# Patient Record
Sex: Female | Born: 1935 | Race: White | Hispanic: No | Smoking: Current every day smoker
Health system: Southern US, Community
[De-identification: ages and names within clinical notes are randomized; demographics above are authoritative.]

## PROBLEM LIST (undated history)

## (undated) DIAGNOSIS — N390 Urinary tract infection, site not specified: Secondary | ICD-10-CM

## (undated) DIAGNOSIS — R197 Diarrhea, unspecified: Secondary | ICD-10-CM

## (undated) DIAGNOSIS — L03119 Cellulitis of unspecified part of limb: Secondary | ICD-10-CM

## (undated) DIAGNOSIS — I519 Heart disease, unspecified: Secondary | ICD-10-CM

## (undated) DIAGNOSIS — K810 Acute cholecystitis: Secondary | ICD-10-CM

## (undated) DIAGNOSIS — G8929 Other chronic pain: Secondary | ICD-10-CM

## (undated) DIAGNOSIS — F329 Major depressive disorder, single episode, unspecified: Secondary | ICD-10-CM

## (undated) DIAGNOSIS — M86169 Other acute osteomyelitis, unspecified tibia and fibula: Secondary | ICD-10-CM

## (undated) DIAGNOSIS — N289 Disorder of kidney and ureter, unspecified: Secondary | ICD-10-CM

## (undated) DIAGNOSIS — F0390 Unspecified dementia without behavioral disturbance: Secondary | ICD-10-CM

## (undated) DIAGNOSIS — D473 Essential (hemorrhagic) thrombocythemia: Secondary | ICD-10-CM

## (undated) DIAGNOSIS — M1990 Unspecified osteoarthritis, unspecified site: Secondary | ICD-10-CM

## (undated) DIAGNOSIS — I259 Chronic ischemic heart disease, unspecified: Secondary | ICD-10-CM

## (undated) DIAGNOSIS — I809 Phlebitis and thrombophlebitis of unspecified site: Secondary | ICD-10-CM

## (undated) DIAGNOSIS — I82409 Acute embolism and thrombosis of unspecified deep veins of unspecified lower extremity: Secondary | ICD-10-CM

## (undated) DIAGNOSIS — E785 Hyperlipidemia, unspecified: Secondary | ICD-10-CM

## (undated) DIAGNOSIS — K838 Other specified diseases of biliary tract: Secondary | ICD-10-CM

## (undated) DIAGNOSIS — F32A Depression, unspecified: Secondary | ICD-10-CM

## (undated) DIAGNOSIS — F1020 Alcohol dependence, uncomplicated: Secondary | ICD-10-CM

## (undated) DIAGNOSIS — I4891 Unspecified atrial fibrillation: Secondary | ICD-10-CM

## (undated) DIAGNOSIS — I2510 Atherosclerotic heart disease of native coronary artery without angina pectoris: Secondary | ICD-10-CM

## (undated) DIAGNOSIS — M549 Dorsalgia, unspecified: Secondary | ICD-10-CM

## (undated) DIAGNOSIS — I10 Essential (primary) hypertension: Secondary | ICD-10-CM

## (undated) DIAGNOSIS — I999 Unspecified disorder of circulatory system: Secondary | ICD-10-CM

## (undated) DIAGNOSIS — K589 Irritable bowel syndrome without diarrhea: Secondary | ICD-10-CM

## (undated) HISTORY — DX: Essential (primary) hypertension: I10

## (undated) HISTORY — PX: THROMBECTOMY: PRO61

## (undated) HISTORY — DX: Alcohol dependence, uncomplicated: F10.20

## (undated) HISTORY — DX: Phlebitis and thrombophlebitis of unspecified site: I80.9

## (undated) HISTORY — DX: Hyperlipidemia, unspecified: E78.5

## (undated) HISTORY — DX: Dorsalgia, unspecified: M54.9

## (undated) HISTORY — DX: Heart disease, unspecified: I51.9

## (undated) HISTORY — DX: Cellulitis of unspecified part of limb: L03.119

## (undated) HISTORY — DX: Urinary tract infection, site not specified: N39.0

## (undated) HISTORY — PX: SHOULDER SURGERY: SHX246

## (undated) HISTORY — DX: Unspecified osteoarthritis, unspecified site: M19.90

## (undated) HISTORY — DX: Acute cholecystitis: K81.0

## (undated) HISTORY — PX: CORONARY ANGIOPLASTY WITH STENT PLACEMENT: SHX49

## (undated) HISTORY — PX: HAND SURGERY: SHX662

## (undated) HISTORY — DX: Depression, unspecified: F32.A

## (undated) HISTORY — DX: Major depressive disorder, single episode, unspecified: F32.9

## (undated) HISTORY — PX: SPINE SURGERY: SHX786

## (undated) HISTORY — PX: EXPLORATORY LAPAROTOMY: SUR591

## (undated) HISTORY — DX: Other chronic pain: G89.29

## (undated) HISTORY — DX: Other acute osteomyelitis, unspecified tibia and fibula: M86.169

## (undated) HISTORY — PX: APPENDECTOMY: SHX54

---

## 2012-02-14 ENCOUNTER — Encounter (HOSPITAL_COMMUNITY): Payer: Self-pay

## 2012-02-14 ENCOUNTER — Emergency Department (HOSPITAL_COMMUNITY)
Admission: EM | Admit: 2012-02-14 | Discharge: 2012-02-14 | Disposition: A | Discharge status: Skilled Nursing Facility | Payer: Medicare Other | Attending: Emergency Medicine | Admitting: Emergency Medicine

## 2012-02-14 DIAGNOSIS — N390 Urinary tract infection, site not specified: Secondary | ICD-10-CM

## 2012-02-14 DIAGNOSIS — Z86718 Personal history of other venous thrombosis and embolism: Secondary | ICD-10-CM | POA: Insufficient documentation

## 2012-02-14 DIAGNOSIS — R6889 Other general symptoms and signs: Secondary | ICD-10-CM | POA: Insufficient documentation

## 2012-02-14 DIAGNOSIS — F17200 Nicotine dependence, unspecified, uncomplicated: Secondary | ICD-10-CM | POA: Insufficient documentation

## 2012-02-14 HISTORY — DX: Unspecified disorder of circulatory system: I99.9

## 2012-02-14 HISTORY — DX: Acute embolism and thrombosis of unspecified deep veins of unspecified lower extremity: I82.409

## 2012-02-14 HISTORY — DX: Disorder of kidney and ureter, unspecified: N28.9

## 2012-02-14 LAB — CBC WITH DIFFERENTIAL/PLATELET
Basophils Absolute: 0.1 10*3/uL (ref 0.0–0.1)
Basophils Relative: 1 % (ref 0–1)
Eosinophils Absolute: 0.1 10*3/uL (ref 0.0–0.7)
HCT: 37.6 % (ref 36.0–46.0)
Hemoglobin: 12.5 g/dL (ref 12.0–15.0)
Lymphocytes Relative: 17 % (ref 12–46)
MCH: 33.4 pg (ref 26.0–34.0)
MCHC: 33.2 g/dL (ref 30.0–36.0)
Monocytes Absolute: 0.4 10*3/uL (ref 0.1–1.0)
Neutro Abs: 3.8 10*3/uL (ref 1.7–7.7)
Neutrophils Relative %: 74 % (ref 43–77)
RDW: 21.5 % — ABNORMAL HIGH (ref 11.5–15.5)

## 2012-02-14 LAB — COMPREHENSIVE METABOLIC PANEL
ALT: 12 U/L (ref 0–35)
AST: 23 U/L (ref 0–37)
Alkaline Phosphatase: 79 U/L (ref 39–117)
CO2: 26 mEq/L (ref 19–32)
GFR calc Af Amer: 57 mL/min — ABNORMAL LOW (ref >90)
GFR calc non Af Amer: 49 mL/min — ABNORMAL LOW (ref >90)
Glucose, Bld: 126 mg/dL — ABNORMAL HIGH (ref 70–99)
Potassium: 4.5 mEq/L (ref 3.5–5.1)
Sodium: 134 mEq/L — ABNORMAL LOW (ref 135–145)

## 2012-02-14 LAB — URINALYSIS, ROUTINE W REFLEX MICROSCOPIC
Glucose, UA: NEGATIVE mg/dL
Hgb urine dipstick: NEGATIVE
Ketones, ur: NEGATIVE mg/dL
Protein, ur: NEGATIVE mg/dL
Urobilinogen, UA: 0.2 mg/dL (ref 0.0–1.0)

## 2012-02-14 LAB — URINE MICROSCOPIC-ADD ON

## 2012-02-14 MED ORDER — CIPROFLOXACIN HCL 500 MG PO TABS: 500.0000 mg | ORAL_TABLET | Freq: Two times a day (BID) | ORAL | Status: AC

## 2012-02-14 MED ORDER — CIPROFLOXACIN HCL 500 MG PO TABS
500.0000 mg | ORAL_TABLET | Freq: Once | ORAL | Status: AC
  Administered 2012-02-14: 500 mg via ORAL
  Filled 2012-02-14: qty 1

## 2012-02-14 MED ORDER — MORPHINE SULFATE 4 MG/ML IJ SOLN
4.0000 mg | Freq: Once | INTRAMUSCULAR | Status: AC
  Administered 2012-02-14: 4 mg via INTRAVENOUS
  Filled 2012-02-14: qty 1

## 2012-02-14 MED ORDER — SODIUM CHLORIDE 0.9 % IV SOLN
INTRAVENOUS | Status: DC
  Administered 2012-02-14: 20:00:00 via INTRAVENOUS

## 2012-02-14 NOTE — ED Notes (Signed)
Pt with lower back and right flank pain.  Decreased urine output according to family.  Pt reports she normally goes frequently.  Pt reporting some slight burning when she does urinate but no pain.

## 2012-02-14 NOTE — ED Provider Notes (Signed)
History     CSN: [id number]  Arrival date & time 02/14/12  1744   First MD Initiated Contact with Patient 02/14/12 1919      Chief Complaint  Patient presents with  . Abnormal Lab     HPI Pt is a resident of an assisted living facility.  She moved in about two weeks ago.  She had routine blood tests on Tuesday.  She and her family were told that her lab tests were abnormal and she needed to come to the ED.  The kidney tests were abnormal.  Pt has been able to urinate and in fact has been urinating frequently.   Past Medical History  Diagnosis Date  . Renal disorder   . Vascular abnormality   . DVT (deep venous thrombosis)     Past Surgical History  Procedure Date  . Thrombectomy   . Shoulder surgery   . Hand surgery   . Arm surg     No family history on file.  History  Substance Use Topics  . Smoking status: Current Everyday Smoker  . Smokeless tobacco: Not on file  . Alcohol Use: No    OB History    Grav Para Term Preterm Abortions TAB SAB Ect Mult Living                  Review of Systems  Constitutional: Negative for fever.  Gastrointestinal: Negative for vomiting and diarrhea.  Genitourinary: Negative for dysuria.  Musculoskeletal: Positive for back pain (chronic pain, pt takes morphine daily).  Skin: Negative for rash.  All other systems reviewed and are negative.    Allergies  Aspirin; Penicillins; and Sulfa antibiotics  Home Medications   Current Outpatient Rx  Name Route Sig Dispense Refill  . ALPRAZOLAM 0.5 MG PO TABS Oral Take 0.5 mg by mouth 2 (two) times daily. Anxiety    . ALUM & MAG HYDROXIDE-SIMETH 200-200-20 MG/5ML PO SUSP Oral Take 15 mLs by mouth 3 (three) times daily.    Marland Kitchen AMLODIPINE BESYLATE 10 MG PO TABS Oral Take 10 mg by mouth daily.    . ASPIRIN 81 MG PO TABS Oral Take 81 mg by mouth daily.    Marland Kitchen CALCIUM CARB-VIT D-C-E-MINERAL 600 MG PO TABS Oral Take 1 tablet by mouth daily.    Marland Kitchen VITAMIN D-3 5000 UNITS PO TABS Oral Take 1  capsule by mouth 2 (two) times daily.    . CHOLESTYRAMINE LIGHT 4 G PO PACK Oral Take 4 g by mouth 2 (two) times daily.    Marland Kitchen DIGOXIN 0.125 MG PO TABS Oral Take 0.125 mg by mouth daily.    . DULOXETINE HCL 60 MG PO CPEP Oral Take 60 mg by mouth daily.    Marland Kitchen GABAPENTIN 300 MG PO CAPS Oral Take 300 mg by mouth 3 (three) times daily.    Marland Kitchen HYDROCODONE-ACETAMINOPHEN 5-325 MG PO TABS Oral Take 1 tablet by mouth every 6 (six) hours as needed. Pain    . HYDROXYUREA 500 MG PO CAPS Oral Take 500 mg by mouth 2 (two) times daily. May take with food to minimize GI side effects.    Marland Kitchen HYDROXYZINE HCL 25 MG PO TABS Oral Take 25 mg by mouth every 8 (eight) hours as needed. Tension    . ISOSORBIDE MONONITRATE ER 60 MG PO TB24 Oral Take 60 mg by mouth daily.    Marland Kitchen LOPERAMIDE HCL 2 MG PO CAPS Oral Take 4 mg by mouth as needed. Take 2 caps by mouth after  first loose stool then 1 cap by mouth after each subsequent    . METOPROLOL TARTRATE 25 MG PO TABS Oral Take 25 mg by mouth 2 (two) times daily.    Marland Kitchen PANTOPRAZOLE SODIUM 40 MG PO TBEC Oral Take 40 mg by mouth daily.    Marland Kitchen SPIRONOLACTONE 25 MG PO TABS Oral Take 25 mg by mouth daily.      BP 120/61  Pulse 63  Temp 97.3 F (36.3 C) (Oral)  Resp 18  SpO2 95%  Physical Exam  Nursing note and vitals reviewed. Constitutional: No distress.       thin  HENT:  Head: Normocephalic and atraumatic.  Right Ear: External ear normal.  Left Ear: External ear normal.  Eyes: Conjunctivae are normal. Right eye exhibits no discharge. Left eye exhibits no discharge. No scleral icterus.  Neck: Neck supple. No tracheal deviation present.  Cardiovascular: Normal rate, regular rhythm and intact distal pulses.   Pulmonary/Chest: Effort normal and breath sounds normal. No stridor. No respiratory distress. She has no wheezes. She has no rales.  Abdominal: Soft. Bowel sounds are normal. She exhibits no distension. There is no tenderness. There is no rebound and no guarding.    Musculoskeletal: She exhibits no edema and no tenderness.       Lumbar back: She exhibits decreased range of motion, tenderness and bony tenderness. She exhibits no swelling and no edema.  Neurological: She is alert. She has normal strength. No sensory deficit. Cranial nerve deficit:  no gross defecits noted. She exhibits normal muscle tone. She displays no seizure activity. Coordination normal.  Skin: Skin is warm and dry. No rash noted.  Psychiatric: She has a normal mood and affect.    ED Course  Procedures (including critical care time)  Labs Reviewed  COMPREHENSIVE METABOLIC PANEL - Abnormal; Notable for the following:    Sodium 134 (*)     Glucose, Bld 126 (*)     BUN 25 (*)     GFR calc non Af Amer 49 (*)     GFR calc Af Amer 57 (*)     All other components within normal limits  CBC WITH DIFFERENTIAL - Abnormal; Notable for the following:    RBC 3.74 (*)     MCV 100.5 (*)     RDW 21.5 (*)     Platelets 561 (*)     All other components within normal limits  DIGOXIN LEVEL  URINALYSIS, ROUTINE W REFLEX MICROSCOPIC   No results found.    MDM  Patient does not have any evidence of anemia. She does have a mild increase in her BUN but there is no evidence of any renal failure.  I do not have any records unfortunately from the facility indicating what labs they were concerned about however she clearly does not have renal failure .  Patient has complaints of chronic back pain but this has not particularly changed. She had not taken her usual dose of pain medications this evening. She has noted urinary frequency and she appears to have a urinary tract infection. Patient will be discharged home on oral antibiotics.        Celene Kras, MD 02/14/12 2137

## 2012-02-14 NOTE — ED Notes (Signed)
Patient is a resident of the [facility]. Family received a call stating that she needed to come to the ED. Family unsure what labs were abnormal. Patient's family states they said renal labs.  [facility] called, but voicemail came on. A message was left for [facility] staff to call the ED.

## 2012-02-14 NOTE — ED Notes (Signed)
Helmut Muster at [facility] returned call and stated that the patient had acute renal failure and she would fax information to the ED. Fax number given to [place].

## 2012-02-17 LAB — URINE CULTURE: Colony Count: >=100000

## 2012-02-18 NOTE — ED Notes (Addendum)
+   Urine  Copy of labs faxed to assisted living home.

## 2012-02-19 ENCOUNTER — Encounter: Payer: Self-pay | Admitting: Internal Medicine

## 2012-02-29 ENCOUNTER — Ambulatory Visit: Payer: No Typology Code available for payment source | Admitting: Internal Medicine

## 2012-03-25 ENCOUNTER — Ambulatory Visit (INDEPENDENT_AMBULATORY_CARE_PROVIDER_SITE_OTHER): Payer: Medicare Other | Admitting: Internal Medicine

## 2012-03-25 ENCOUNTER — Encounter: Payer: Self-pay | Admitting: Internal Medicine

## 2012-03-25 VITALS — BP 120/60 | HR 62 | Ht 66.5 in | Wt 102.4 lb

## 2012-03-25 DIAGNOSIS — R110 Nausea: Secondary | ICD-10-CM

## 2012-03-25 DIAGNOSIS — R634 Abnormal weight loss: Secondary | ICD-10-CM

## 2012-03-25 DIAGNOSIS — R1013 Epigastric pain: Secondary | ICD-10-CM

## 2012-03-25 NOTE — Patient Instructions (Addendum)
You have been scheduled for an endoscopy with propofol. Please follow written instructions given to you at your visit today. If you use inhalers (even only as needed), please bring them with you on the day of your procedure.  We will obtain your records from Dr. Deland Pretty in [place] with the ROI you signed today.  We have called for your chest x-ray report to be faxed to [place] from [facility].  Thank you for choosing me and [facility].  Iva Boop, M.D., [facility]

## 2012-03-25 NOTE — Progress Notes (Signed)
Subjective:    Patient ID: Tamara Turner, female    DOB: 1921/10/15, 78 y.o.   MRN: [id number]  HPI This is a very pleasant elderly white woman who recently moved to [facility], now residing in [facility], assisted living. She is having problems with nausea which is fairly constant but mostly postprandial, and postprandial upper abdominal pain mostly in the epigastrium. It's been going on for weeks to months and is associated with a weight loss. It's not clear what the time course of the weight loss is. He reports weighing 119 pounds in [place] and and going from 109-102 pounds recently. She says that she eats a few bites of food and she feels full and has pain and cannot eat more. So she spaces out meals but has not eating like she use to and is dropping weight. Her closer becoming loose. He has some difficulty swallowing pills at times but does not have dysphagia with food. Do not have records from [place] as she spent the last 40 years. Her niece who is her closest relative here with her today. The patient does not seem to have dementia or anything like that but details of her medical history are not totally forthcoming due to recall issues.  She was recently in the emergency room at the end of August, she was found to have an Escherichia coli UTI and she was placed on Cipro.  She complains of a 2 week history of a dry cough. She reports a chest x-ray being okay, taken at [facility], I do not have those results. Review of the medication sheet shows that she was given a Z-Pak. It sounds like her nausea stayed the same through that. Medications, allergies, past medical history, past surgical history, family history and social history are reviewed and updated in the EMR.   Review of Systems This is positive for chronic back pain, decreased vision, fatigue, and dyspnea, weak lower extremities though she can walk and stand not much and uses a wheelchair, insomnia and excessive  urination. All other review of systems are negative at this time.    Objective:   Physical Exam General:  Elderly somewhat chronically ill and gaunt, acute distress Eyes:  anicteric. ENT:   Mouth and posterior pharynx free of lesions.  Neck:   supple w/o thyromegaly or mass.  Lungs: Clear to auscultation bilaterally. Heart:  S1S2, no rubs, murmurs, gallops. Abdomen:  soft, non-tender, no hepatosplenomegaly, hernia, or mass and BS+. No bruits are heard. The abdominal skin is somewhat loose Lymph:  no cervical or supraclavicular adenopathy. No inguinal adenopathy Extremities:   no edema Skin   no rash. Neuro:  A&O x 3.  Psych:  appropriate mood and  Affect.   Data Reviewed: Labs and notes from ER visit of August 2013 I am requesting records from her primary care physician in [facility] also see if we get a chest x-ray report from [facility]       Assessment & Plan:   1. Epigastric pain   2. Weight loss   3. Nausea    1. There are multitude of possibilities but given her symptoms I think upper GI tract ulcer disease or malignancy is possible, she could have mesenteric ischemia though it sounds like the pain onset is relatively quick and I don't hear any bruits but need to keep that in mind. Many of her medications may be related to some of her symptoms. Her digoxin level is okay at one point for about 5  weeks ago. Her labs really looked pretty good as well. 2. Plan for an upper GI endoscopy. I've explained the risks benefits and indications and they understand and agree to proceed. We'll do this tomorrow at about 3:30 PM. 3. If this is unrevealing, a CT of the abdomen and pelvis would be reasonable. 4. Await records. I do not know why she is on Hydrea, certainly could have some form of chronic low-grade lymphoma or chronic leukemia process. Her niece reports that the patient was in hospice at some point in [place]. We do not have those details her records.

## 2012-03-26 ENCOUNTER — Ambulatory Visit (AMBULATORY_SURGERY_CENTER): Payer: Medicare Other | Admitting: Internal Medicine

## 2012-03-26 ENCOUNTER — Other Ambulatory Visit: Payer: Self-pay

## 2012-03-26 ENCOUNTER — Encounter: Payer: Self-pay | Admitting: Internal Medicine

## 2012-03-26 VITALS — BP 107/39 | HR 180 | Resp 25 | Ht 66.0 in | Wt 102.0 lb

## 2012-03-26 DIAGNOSIS — R634 Abnormal weight loss: Secondary | ICD-10-CM

## 2012-03-26 DIAGNOSIS — R110 Nausea: Secondary | ICD-10-CM

## 2012-03-26 DIAGNOSIS — R1013 Epigastric pain: Secondary | ICD-10-CM

## 2012-03-26 MED ORDER — SODIUM CHLORIDE 0.9 % IV SOLN: 500.0000 mL | INTRAVENOUS | Status: DC

## 2012-03-26 NOTE — Patient Instructions (Addendum)
The esophagus, stomach and duodenum look normal. My office will arrange for a CT scan of the abdomen and pelvis.  Thank you for choosing me and [facility].  Iva Boop, MD, [facility]  Discharge instructions given with verbal understanding. Resume previous medications. Office will arrange CT SCAN. YOU HAD AN ENDOSCOPIC PROCEDURE TODAY AT THE [facility]: Refer to the procedure report that was given to you for any specific questions about what was found during the examination.  If the procedure report does not answer your questions, please call your gastroenterologist to clarify.  If you requested that your care partner not be given the details of your procedure findings, then the procedure report has been included in a sealed envelope for you to review at your convenience later.  YOU SHOULD EXPECT: Some feelings of bloating in the abdomen. Passage of more gas than usual.  Walking can help get rid of the air that was put into your GI tract during the procedure and reduce the bloating. If you had a lower endoscopy (such as a colonoscopy or flexible sigmoidoscopy) you may notice spotting of blood in your stool or on the toilet paper. If you underwent a bowel prep for your procedure, then you may not have a normal bowel movement for a few days.  DIET: Your first meal following the procedure should be a light meal and then it is ok to progress to your normal diet.  A half-sandwich or bowl of soup is an example of a good first meal.  Heavy or fried foods are harder to digest and may make you feel nauseous or bloated.  Likewise meals heavy in dairy and vegetables can cause extra gas to form and this can also increase the bloating.  Drink plenty of fluids but you should avoid alcoholic beverages for 24 hours.  ACTIVITY: Your care partner should take you home directly after the procedure.  You should plan to take it easy, moving slowly for the rest of the day.  You can resume normal  activity the day after the procedure however you should NOT DRIVE or use heavy machinery for 24 hours (because of the sedation medicines used during the test).    SYMPTOMS TO REPORT IMMEDIATELY: A gastroenterologist can be reached at any hour.  During normal business hours, 8:30 AM to 5:00 PM Monday through Friday, call [phone].  After hours and on weekends, please call the GI answering service at [phone] who will take a message and have the physician on call contact you.   Following upper endoscopy (EGD)  Vomiting of blood or coffee ground material  New chest pain or pain under the shoulder blades  Painful or persistently difficult swallowing  New shortness of breath  Fever of 100F or higher  Black, tarry-looking stools  FOLLOW UP: If any biopsies were taken you will be contacted by phone or by letter within the next 1-3 weeks.  Call your gastroenterologist if you have not heard about the biopsies in 3 weeks.  Our staff will call the home number listed on your records the next business day following your procedure to check on you and address any questions or concerns that you may have at that time regarding the information given to you following your procedure. This is a courtesy call and so if there is no answer at the home number and we have not heard from you through the emergency physician on call, we will assume that you have returned to your  regular daily activities without incident.  SIGNATURES/CONFIDENTIALITY: You and/or your care partner have signed paperwork which will be entered into your electronic medical record.  These signatures attest to the fact that that the information above on your After Visit Summary has been reviewed and is understood.  Full responsibility of the confidentiality of this discharge information lies with you and/or your care-partner.

## 2012-03-26 NOTE — Progress Notes (Signed)
Per Dr. Leone Payor CT Abd/pelvis with contrast set up for 03/28/12 at [facility] CT at 2:30pm, arrive 2:15pm, 2 bottles of contrast given to patient.  Date and time explained to niece that brought patient today for her EGD procedure today.

## 2012-03-26 NOTE — Op Note (Signed)
[facility] [address] [place], [address]   ENDOSCOPY PROCEDURE REPORT  PATIENT: Tamara, Turner  MR#: [id number] BIRTHDATE: 11-07-1921 , 89  yrs. old GENDER: Female ENDOSCOPIST: Iva Boop, MD, [facility] REFERRED BY: PROCEDURE DATE:  03/26/2012 PROCEDURE:  EGD, diagnostic ASA CLASS:     Class III INDICATIONS:  epigastric pain.   weight loss. MEDICATIONS: Propofol (Diprivan) 20 mg IV, MAC sedation, administered by CRNA, and These medications were titrated to patient response per physician's verbal order TOPICAL ANESTHETIC: Cetacaine Spray  DESCRIPTION OF PROCEDURE: After the risks benefits and alternatives of the procedure were thoroughly explained, informed consent was obtained.  The LB [id number] endoscope was introduced through the mouth and advanced to the second portion of the duodenum. Without limitations.  The instrument was slowly withdrawn as the mucosa was fully examined.      ESOPHAGUS: The mucosa of the esophagus appeared normal.  STOMACH: The mucosa of the stomach appeared normal.  DUODENUM: The duodenal mucosa showed no abnormalities in the bulb and second portion of the duodenum.  Retroflexed views revealed no abnormalities.     The scope was then withdrawn from the patient and the procedure completed.  COMPLICATIONS: There were no complications. ENDOSCOPIC IMPRESSION: 1.   The mucosa of the esophagus appeared normal 2.   The mucosa of the stomach appeared normal 3.   The duodenal mucosa showed no abnormalities in the bulb and second portion of the duodenum  RECOMMENDATIONS: CT Scan abdomen and Pelvis with contrast to evaluate epigastric pain and weight loss. My office will arrange    eSigned:  Iva Boop, MD, [facility] 03/26/2012 3:27 PM   CC:The Patient  and Kriste Basque, MD

## 2012-03-26 NOTE — Progress Notes (Signed)
Patient did not experience any of the following events: a burn prior to discharge; a fall within the facility; wrong site/side/patient/procedure/implant event; or a hospital transfer or hospital admission upon discharge from the facility. (G8907) Patient did not have preoperative order for IV antibiotic SSI prophylaxis. (G8918)  

## 2012-03-27 ENCOUNTER — Other Ambulatory Visit: Payer: Self-pay

## 2012-03-27 ENCOUNTER — Encounter: Payer: Self-pay | Admitting: Family Medicine

## 2012-03-27 ENCOUNTER — Ambulatory Visit (INDEPENDENT_AMBULATORY_CARE_PROVIDER_SITE_OTHER): Payer: Medicare Other | Admitting: Family Medicine

## 2012-03-27 ENCOUNTER — Telehealth: Payer: Self-pay | Admitting: *Deleted

## 2012-03-27 VITALS — BP 100/60 | Temp 98.6°F | Ht 68.0 in | Wt 104.0 lb

## 2012-03-27 DIAGNOSIS — R634 Abnormal weight loss: Secondary | ICD-10-CM

## 2012-03-27 DIAGNOSIS — R1013 Epigastric pain: Secondary | ICD-10-CM

## 2012-03-27 DIAGNOSIS — J069 Acute upper respiratory infection, unspecified: Secondary | ICD-10-CM

## 2012-03-27 NOTE — Telephone Encounter (Signed)

## 2012-03-27 NOTE — Patient Instructions (Signed)
-  it seems that your cough and drainage are likely from a viral upper respiratory infection. I would advise you to see if your doctor can help you with a nasal steroid to help with this. Throat lozenges and honey will be helpful for your cough. See your doctor at your facility if worsening or not improving.  -Please ask to have an appointment with your doctor and your niece so that you can discuss as a group your care - but I would not advise having multiple primary care doctors.

## 2012-03-27 NOTE — Progress Notes (Addendum)
Chief Complaint  Patient presents with  . Establish Care    HPI:  Ms. Flikkema is here to establish care.  Medical problems, Medications, PMH and SH were reviewed and updated.  She recently saw a gastroenterologist, Dr. Leone Payor for nausea and weight loss. Per review of notes she had an upper endoscopy that was normal and GI is going to arrange for her to have a CT scan of the abd/pelvis. Lives at [facility] with assistance living and has a primary care doctor there.Today, she specifically wants me to address a cough she has had for 1-2 weeks with drainage in her throat. The patient voices she is ok with her current doctor, but the niece whom is present is frustrated that this doctor only comes 1x per week and that she has not met him.  Denies SOB, fevers, sinus pain. Did just complete a course of Azithromycin. Had a CXR which niece reports was negative.  Past Medical History  Diagnosis Date  . Vascular abnormality   . DVT (deep venous thrombosis)   . Alcoholism   . Depression   . Hypertension   . UTI (lower urinary tract infection)   . Chronic back pain   . Arthritis     all over  . Renal disorder   . Heart disease   . Hyperlipidemia   . Phlebitis   . UTI (urinary tract infection)     Family History  Problem Relation Age of Onset  . Colon cancer Neg Hx     History   Social History  . Marital Status: Widowed    Spouse Name: N/A    Number of Children: 0  . Years of Education: N/A   Occupational History  . Retired     Social History Main Topics  . Smoking status: Current Every Day Smoker -- 0.2 packs/day    Types: Cigarettes  . Smokeless tobacco: Never Used  . Alcohol Use: No  . Drug Use: No  . Sexually Active: None   Other Topics Concern  . None   Social History Narrative   Widowed, moved to [place] to assisted living in August 2013 after many years in [place] Daily caffeine 3 drinks dailyNext of kin is her niece Janeece Agee    Current  outpatient prescriptions:ALPRAZolam (XANAX) 0.25 MG tablet, Take 0.25 mg by mouth 2 (two) times daily., Disp: , Rfl: ;  alum & mag hydroxide-simeth (MI-ACID) 200-200-20 MG/5ML suspension, Take 15 mLs by mouth 3 (three) times daily., Disp: , Rfl: ;  amLODipine (NORVASC) 10 MG tablet, Take 10 mg by mouth daily., Disp: , Rfl: ;  aspirin 81 MG tablet, Take 81 mg by mouth daily., Disp: , Rfl:  Calcium Carb-Vit D-C-E-Mineral 600 MG TABS, Take 1 tablet by mouth daily., Disp: , Rfl: ;  Cholecalciferol (VITAMIN D-3) 5000 UNITS TABS, Take 1 capsule by mouth daily. , Disp: , Rfl: ;  cholestyramine light (PREVALITE) 4 G packet, Take 4 g by mouth 2 (two) times daily., Disp: , Rfl: ;  digoxin (LANOXIN) 0.125 MG tablet, Take 0.125 mg by mouth daily., Disp: , Rfl: ;  DULoxetine (CYMBALTA) 60 MG capsule, Take 60 mg by mouth daily., Disp: , Rfl:  gabapentin (NEURONTIN) 300 MG capsule, Take 300 mg by mouth 3 (three) times daily., Disp: , Rfl: ;  HYDROcodone-acetaminophen (NORCO/VICODIN) 5-325 MG per tablet, Take 1 tablet by mouth every 6 (six) hours as needed. Pain, Disp: , Rfl: ;  hydroxyurea (HYDREA) 500 MG capsule, Take 500 mg by mouth 2 (  two) times daily. May take with food to minimize GI side effects., Disp: , Rfl:  hydrOXYzine (ATARAX/VISTARIL) 25 MG tablet, Take 25 mg by mouth every 8 (eight) hours as needed. Tension, Disp: , Rfl: ;  isosorbide mononitrate (IMDUR) 60 MG 24 hr tablet, Take 60 mg by mouth daily., Disp: , Rfl: ;  lactobacillus (FLORANEX/LACTINEX) PACK, Take 1 g by mouth 2 (two) times daily., Disp: , Rfl:  loperamide (IMODIUM) 2 MG capsule, Take 4 mg by mouth as needed. Take 2 caps by mouth after first loose stool then 1 cap by mouth after each subsequent, Disp: , Rfl: ;  metoprolol tartrate (LOPRESSOR) 25 MG tablet, Take 25 mg by mouth daily. , Disp: , Rfl: ;  morphine (MS CONTIN) 30 MG 12 hr tablet, Take 30 mg by mouth 2 (two) times daily., Disp: , Rfl: ;  Multiple Vitamins-Minerals (MULTIVITAL) tablet, Take  1 tablet by mouth daily., Disp: , Rfl:  pantoprazole (PROTONIX) 40 MG tablet, Take 40 mg by mouth daily., Disp: , Rfl: ;  polyethylene glycol (MIRALAX / GLYCOLAX) packet, Take 17 g by mouth as directed., Disp: , Rfl: ;  spironolactone (ALDACTONE) 25 MG tablet, Take 25 mg by mouth daily., Disp: , Rfl:  No current facility-administered medications for this visit. Facility-Administered Medications Ordered in Other Visits: DISCONTD: 0.9 %  sodium chloride infusion, 500 mL, Intravenous, Continuous, Iva Boop, MD  EXAM[facility]eas[facility] Mons Vitals:   03/27/12 1258  BP: 100/60  Temp: 98.6 F (37 C)    Body mass index is 15.81 kg/(m^2).  GENERAL: vitals reviewed and listed above, alert, oriented, appears well hydrated and in no acute distress  HEENT: atraumatic, conjunttiva clear, no obvious abnormalities on inspection of external nose and ears, ear canals clear, clear rhinorrhea, PND  NECK: no obvious masses on inspection or LAD  LUNGS: clear to auscultation bilaterally, no wheezes, rales or rhonchi, good air movement  CV: HRRR  MS: in a wheelchair  PSYCH: pleasant and cooperative, no obvious depression or anxiety  ASSESSMENT AND PLAN:  Discussed the following assessment and plan:  1. Upper respiratory infection    Complicated patient with a main concern for cough and drainage in throat today. Normal vitals and lungs CTA on exam. Niece reports neg cxr and just completed tx  With azithro. Suspect resolving  viral URI and did advise a nasal steroid and a sample was provided. Discussed safe supportive measures and advised to notify her current doctor. Pt currently has a facility PCP.Pt on many sedative medications and narcotic pain medications. I advised that I do not prescribe these medications in elderly patients due to potential adverse effects. I did offer referral to a pain specialist.  We talked about the benefits vs cons of having a facility physician vs community PCP versus a geriatrician.  The niece would like to avoid multiple trips out for the patient if possible. The patient seems content with her current doctor, but the niece is frustrated she has not met this doctor. I  advised that it is probably not a good idea to have multiple primary care doctors.I advised that the niece may wish to set up an appointment to talk with her current doctor to make a decision. We also discussed the patient possibly seeing a geriatrics physician with higher comfort level with managing narcotics in the elderly. I offered to place a referral if they so desire. Niece and patient will have a meeting with current doctor, then make a decision and they know my  door is open if they decide to return here for primary care. We discussed that we will need to obtain records from previous doctors if they decide to come here and would likely need to work to simplify her medication list. I will await their decision.    Patient Instructions  -it seems that your cough and drainage are likely from a viral upper respiratory infection. I would advise you to see if your doctor can help you with a nasal steroid to help with this. Throat lozenges and honey will be helpful for your cough. See your doctor at your facility if worsening or not improving.  -Please ask to have an appointment with your doctor and your niece so that you can discuss as a group your care - but I would not advise having multiple primary care doctors.     Kriste Basque R.

## 2012-03-28 ENCOUNTER — Ambulatory Visit (INDEPENDENT_AMBULATORY_CARE_PROVIDER_SITE_OTHER)
Admission: RE | Admit: 2012-03-28 | Discharge: 2012-03-28 | Disposition: A | Discharge status: Home / Self Care | Payer: Medicare Other | Source: Ambulatory Visit | Attending: Internal Medicine | Admitting: Internal Medicine

## 2012-03-28 DIAGNOSIS — R634 Abnormal weight loss: Secondary | ICD-10-CM

## 2012-03-28 DIAGNOSIS — R1013 Epigastric pain: Secondary | ICD-10-CM

## 2012-03-28 MED ORDER — IOHEXOL 300 MG/ML  SOLN
80.0000 mL | Freq: Once | INTRAMUSCULAR | Status: AC | PRN
  Administered 2012-03-28: 80 mL via INTRAVENOUS

## 2012-03-31 ENCOUNTER — Other Ambulatory Visit: Payer: Self-pay

## 2012-03-31 ENCOUNTER — Telehealth: Payer: Self-pay | Admitting: Internal Medicine

## 2012-03-31 DIAGNOSIS — R932 Abnormal findings on diagnostic imaging of liver and biliary tract: Secondary | ICD-10-CM

## 2012-03-31 NOTE — Progress Notes (Signed)
Quick Note:  The main finding are some changes in the liver and bile duct that suggest a possible obstruction but her liver tests were normal in august  Please get hepatic function panel re 793.3 dx  We are still trying to get old records which would really help things   ______

## 2012-03-31 NOTE — Telephone Encounter (Signed)
Results reviewed, she will bring the patient for labs

## 2012-04-01 ENCOUNTER — Encounter: Payer: Self-pay | Admitting: Cardiovascular Disease

## 2012-04-01 ENCOUNTER — Telehealth: Payer: Self-pay | Admitting: Internal Medicine

## 2012-04-01 ENCOUNTER — Ambulatory Visit (INDEPENDENT_AMBULATORY_CARE_PROVIDER_SITE_OTHER): Payer: Medicare Other | Admitting: Cardiovascular Disease

## 2012-04-01 VITALS — BP 106/62 | HR 70 | Ht 68.0 in | Wt 102.0 lb

## 2012-04-01 DIAGNOSIS — I259 Chronic ischemic heart disease, unspecified: Secondary | ICD-10-CM

## 2012-04-01 DIAGNOSIS — R634 Abnormal weight loss: Secondary | ICD-10-CM | POA: Insufficient documentation

## 2012-04-01 DIAGNOSIS — I2510 Atherosclerotic heart disease of native coronary artery without angina pectoris: Secondary | ICD-10-CM

## 2012-04-01 DIAGNOSIS — I4891 Unspecified atrial fibrillation: Secondary | ICD-10-CM

## 2012-04-01 DIAGNOSIS — I82409 Acute embolism and thrombosis of unspecified deep veins of unspecified lower extremity: Secondary | ICD-10-CM

## 2012-04-01 HISTORY — DX: Chronic ischemic heart disease, unspecified: I25.9

## 2012-04-01 HISTORY — DX: Unspecified atrial fibrillation: I48.91

## 2012-04-01 LAB — BASIC METABOLIC PANEL
CO2: 30 mEq/L (ref 19–32)
Glucose, Bld: 110 mg/dL — ABNORMAL HIGH (ref 70–99)
Potassium: 4.9 mEq/L (ref 3.5–5.1)
Sodium: 129 mEq/L — ABNORMAL LOW (ref 135–145)

## 2012-04-01 LAB — HEPATIC FUNCTION PANEL
AST: 30 U/L (ref 0–37)
Total Bilirubin: 1.2 mg/dL (ref 0.3–1.2)

## 2012-04-01 NOTE — Assessment & Plan Note (Signed)
Tamara Turner presents today for further management of her chronic medical problems including atrial fibrillation, coronary artery disease, and history of DVT. She now is basically down to a wheelchair. She has not been able to walk and years. She's not been able to get any physical therapy.  She was previously on Coumadin but she thought that Medicare took her off Coumadin. I tried to explain that Medicare did not prescribe her discontinue medications. She seemed to be a little confused about the issue.  At this point I think that she is very unstable and that she is at high risk for falling. In fact, she fell last night. I do not think that she is a Coumadin candidate because of that reason.  She also has a history of DVT but again I don't think that she's a good candidate for Coumadin.  Her rate is well controlled. We'll continue with her same medications.

## 2012-04-01 NOTE — Assessment & Plan Note (Signed)
She's been seeing Dr. Leone Payor for this. She had an EGD which was basically unremarkable. Chest CT scan that was unremarkable the possibility of some issues with gallbladder. Dr. Leone Payor  has requested a hepatic profile which we have drawn today.

## 2012-04-01 NOTE — Assessment & Plan Note (Signed)
She's not having episodes of chest pain. We'll continue with her current medications.

## 2012-04-01 NOTE — Telephone Encounter (Signed)
I spoke with the lab at [facility]. They can do the lab there.  [name] has been notified

## 2012-04-01 NOTE — Patient Instructions (Addendum)
Your physician recommends that you schedule a follow-up appointment in: 3 months   Your physician recommends that you return for lab work in: today bmet hfp  Your physician has requested that you have an echocardiogram. Echocardiography is a painless test that uses sound waves to create images of your heart. It provides your doctor with information about the size and shape of your heart and how well your heart's chambers and valves are working. This procedure takes approximately one hour. There are no restrictions for this procedure.

## 2012-04-01 NOTE — Progress Notes (Signed)
Tamara Turner Date of Birth  1921-08-16       [facility] Office [address]  [address] [place], [place]  [address]   [place], [place]  [address] [phone]     [phone]   Fax  [phone]    Fax [phone]  Problem List: 1. Atrial Fibrillation 2. DVT 3. Hypertension 4. CAD - s/p stenting ( in [place], records not avaliable) 5.   History of Present Illness:  Tamara Turner is an 78 yo with hx of HTN, atrial fibrillation, CAD s/p stenting,  DVT.   She recently moved from [place] and presents today to establish herself with a cardiologist. She does not have any acute complaints. All of her issues are chronic and been managed by her doctor back in [place].   She thinks she has been on coumadin and on Plavix in the past.  She spends most of her time in the wheelchair for the past 2 years or so.  She is not able to walk around.    She has not had any PT is a while.  She still smokes several cigarettes a day.    She has fallen frequently and in fact fell last night.  She has had lots of epigastric pain after eating .  She has lost about 20 pounds in the past 6 months or so.  She has been seeing Dr. Leone Payor for that   Current Outpatient Prescriptions on File Prior to Visit  Medication Sig Dispense Refill  . ALPRAZolam (XANAX) 0.25 MG tablet Take 0.25 mg by mouth 2 (two) times daily.      Marland Kitchen alum & mag hydroxide-simeth (MI-ACID) 200-200-20 MG/5ML suspension Take 15 mLs by mouth 3 (three) times daily.      Marland Kitchen amLODipine (NORVASC) 10 MG tablet Take 10 mg by mouth daily.      Marland Kitchen aspirin 81 MG tablet Take 81 mg by mouth daily.      . Calcium Carb-Vit D-C-E-Mineral 600 MG TABS Take 1 tablet by mouth daily.      . Cholecalciferol (VITAMIN D-3) 5000 UNITS TABS Take 1 capsule by mouth daily.       . cholestyramine light (PREVALITE) 4 G packet Take 4 g by mouth 2 (two) times daily.      . digoxin (LANOXIN) 0.125 MG tablet Take  0.125 mg by mouth daily.      . DULoxetine (CYMBALTA) 60 MG capsule Take 60 mg by mouth daily.      Marland Kitchen gabapentin (NEURONTIN) 300 MG capsule Take 300 mg by mouth 3 (three) times daily.      Marland Kitchen HYDROcodone-acetaminophen (NORCO/VICODIN) 5-325 MG per tablet Take 1 tablet by mouth every 6 (six) hours as needed. Pain      . hydroxyurea (HYDREA) 500 MG capsule Take 500 mg by mouth 2 (two) times daily. May take with food to minimize GI side effects.      . hydrOXYzine (ATARAX/VISTARIL) 25 MG tablet Take 25 mg by mouth every 8 (eight) hours as needed. Tension      . isosorbide mononitrate (IMDUR) 60 MG 24 hr tablet Take 60 mg by mouth daily.      Marland Kitchen lactobacillus (FLORANEX/LACTINEX) PACK Take 1 g by mouth 2 (two) times daily.      Marland Kitchen loperamide (IMODIUM) 2 MG capsule Take 4 mg by mouth as needed. Take 2 caps by mouth after first loose stool then 1 cap by mouth after each  subsequent      . metoprolol tartrate (LOPRESSOR) 25 MG tablet Take 25 mg by mouth daily.       Marland Kitchen morphine (MS CONTIN) 30 MG 12 hr tablet Take 30 mg by mouth 2 (two) times daily.      . Multiple Vitamins-Minerals (MULTIVITAL) tablet Take 1 tablet by mouth daily.      . pantoprazole (PROTONIX) 40 MG tablet Take 40 mg by mouth daily.      . polyethylene glycol (MIRALAX / GLYCOLAX) packet Take 17 g by mouth as directed.      Marland Kitchen spironolactone (ALDACTONE) 25 MG tablet Take 25 mg by mouth daily.        Allergies  Allergen Reactions  . Aspirin Nausea Only    Patient does not have any sensitivity to the low dose aspirin   . Penicillins Hives  . Sulfa Antibiotics Nausea Only    Past Medical History  Diagnosis Date  . Vascular abnormality   . DVT (deep venous thrombosis)   . Alcoholism   . Depression   . Hypertension   . UTI (lower urinary tract infection)   . Chronic back pain   . Arthritis     all over  . Renal disorder   . Heart disease   . Hyperlipidemia   . Phlebitis   . UTI (urinary tract infection)     Past Surgical  History  Procedure Date  . Thrombectomy   . Shoulder surgery   . Hand surgery   . Appendectomy   . Coronary angioplasty with stent placement   . Spine surgery     x 3    History  Smoking status  . Current Every Day Smoker -- 0.2 packs/day  . Types: Cigarettes  Smokeless tobacco  . Never Used    History  Alcohol Use No    Family History  Problem Relation Age of Onset  . Colon cancer Neg Hx     Reviw of Systems:  Reviewed in the HPI.  All other systems are negative.  Physical Exam: Blood pressure 106/62, pulse 70, height 5\' 8"  (1.727 m), weight 102 lb (46.267 kg), SpO2 91.00%. General: Well developed, well nourished, in no acute distress.  Head: Normocephalic, atraumatic, sclera non-icteric, mucus membranes are moist,   Neck: Supple. Carotids are 2 + without bruits. No JVD  Lungs: Clear bilaterally to auscultation.  Heart: regular rate.  normal  S1 S2. No murmurs, gallops or rubs.  Abdomen: Soft, non-tender, non-distended with normal bowel sounds. No hepatomegaly. No rebound/guarding. No masses.  Msk:  Strength and tone are normal Extremities: No clubbing or cyanosis. No edema.  Distal pedal pulses are 2+ and equal bilaterally.  Neuro: Alert and oriented X 3. Moves all extremities spontaneously.  Psych:  Responds to questions appropriately with a normal affect.  ECG: Oct.15, 2013-atrial fibrillation at 77 beats a minute. She has a right bundle branch block. 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 She has anterior and lateral T-wave inversions.    Assessment / Plan:

## 2012-04-03 ENCOUNTER — Telehealth: Payer: Self-pay | Admitting: Internal Medicine

## 2012-04-03 NOTE — Telephone Encounter (Signed)
With normal LFT's I would not chase the possible abnormality on CT right now unless I cannot get records from [facility] did request records from PCP in [place] - ? If they were an [employer] data [employer] and we could use Care everywhere

## 2012-04-03 NOTE — Telephone Encounter (Signed)
Dr. Leone Payor patient had LFT's drawn at cardiology can you please review and advise

## 2012-04-04 NOTE — Telephone Encounter (Signed)
Patient's caregiver Tamara Turner was notified.  Patient is having continued abdominal pain it is worse after a meal.  Is there any additional meds or treatments you recommend?

## 2012-04-04 NOTE — Telephone Encounter (Signed)
I spoke with the patient's caregiver. She wants to discuss with the patient and they will call me back next week.

## 2012-04-04 NOTE — Telephone Encounter (Signed)
I guess we will need to have her get an MRCP then  Evaluate dilated bile duct as seen on CT and abdominal pain

## 2012-04-04 NOTE — Telephone Encounter (Signed)
Left message for patient to call back  

## 2012-04-08 ENCOUNTER — Telehealth: Payer: Self-pay | Admitting: Internal Medicine

## 2012-04-08 ENCOUNTER — Encounter: Payer: Self-pay | Admitting: Internal Medicine

## 2012-04-08 DIAGNOSIS — D473 Essential (hemorrhagic) thrombocythemia: Secondary | ICD-10-CM | POA: Insufficient documentation

## 2012-04-08 DIAGNOSIS — D75839 Thrombocytosis, unspecified: Secondary | ICD-10-CM

## 2012-04-08 HISTORY — DX: Thrombocytosis, unspecified: D75.839

## 2012-04-08 NOTE — Telephone Encounter (Signed)
Care everywhere request - no records seen.

## 2012-04-08 NOTE — Telephone Encounter (Signed)
Left message for patient to call back  

## 2012-04-08 NOTE — Progress Notes (Signed)
Patient ID: Tamara Turner, female   DOB: 10-07-1921, 78 y.o.   MRN: [id number]  2012 and [facility] records reveal that she has IBS and chronic abdominal pain.  She also has a history of thrombocytosis - presume that is why she is on hydrea  No mention of biliary tract abnormalities sen.  Awaiting [facility] records.

## 2012-04-08 NOTE — Telephone Encounter (Signed)
I spoke with the patient's POA Pat.  She has more information about medical records that we may be able to get from [facility].  I have sent a release of information to a Dr. Molly Maduro m. Katrinka Blazing [phone].  The patient and her want to wait on MRI until they get all the records review from [place].  We have sent original release again to [facility].

## 2012-04-15 ENCOUNTER — Telehealth: Payer: Self-pay | Admitting: Internal Medicine

## 2012-04-15 DIAGNOSIS — R109 Unspecified abdominal pain: Secondary | ICD-10-CM

## 2012-04-15 DIAGNOSIS — K838 Other specified diseases of biliary tract: Secondary | ICD-10-CM

## 2012-04-15 NOTE — Telephone Encounter (Signed)
OK She needs to be able to hold her breath in the MR

## 2012-04-15 NOTE — Telephone Encounter (Signed)
Dennie Bible advised that we are still waiting for [facility] records.  Dr. Leone Payor they are willing to go through with MRCP.  Please advise if you want to proceed or wait for the records?

## 2012-04-16 NOTE — Telephone Encounter (Signed)
Patient is scheduled on an open scanner for 04/24/12 at [facility] [address].  Dennie Bible is advised that the patient will need to be NPO for 4 hours prior and to arrive at 2:30

## 2012-04-24 ENCOUNTER — Ambulatory Visit
Admission: RE | Admit: 2012-04-24 | Discharge: 2012-04-24 | Disposition: A | Discharge status: Home / Self Care | Payer: Medicare Other | Source: Ambulatory Visit | Attending: Internal Medicine | Admitting: Internal Medicine

## 2012-04-24 DIAGNOSIS — K838 Other specified diseases of biliary tract: Secondary | ICD-10-CM

## 2012-04-24 DIAGNOSIS — R109 Unspecified abdominal pain: Secondary | ICD-10-CM

## 2012-04-24 MED ORDER — GADOBENATE DIMEGLUMINE 529 MG/ML IV SOLN
9.0000 mL | Freq: Once | INTRAVENOUS | Status: AC | PRN
  Administered 2012-04-24: 9 mL via INTRAVENOUS

## 2012-04-25 NOTE — Progress Notes (Signed)
Quick Note:  MRI shows some changes that look to be from a prior inflammatory process in her liver. Could be related to some of her pain but with normal liver tests I doubt it very much. I do not recommend any more testing.  As stated before - the few notes I was able to see from [facility] indicated chronic abdominal pain issues x many years.  I am sorry but I have no other recommendations at this time and recommend she be followed by primary care overall. ______

## 2012-04-28 ENCOUNTER — Ambulatory Visit (HOSPITAL_COMMUNITY): Payer: Medicare Other | Attending: Cardiovascular Disease | Admitting: Radiology

## 2012-04-28 DIAGNOSIS — I080 Rheumatic disorders of both mitral and aortic valves: Secondary | ICD-10-CM | POA: Insufficient documentation

## 2012-04-28 DIAGNOSIS — I4891 Unspecified atrial fibrillation: Secondary | ICD-10-CM | POA: Insufficient documentation

## 2012-04-28 DIAGNOSIS — I2510 Atherosclerotic heart disease of native coronary artery without angina pectoris: Secondary | ICD-10-CM | POA: Insufficient documentation

## 2012-04-28 DIAGNOSIS — I379 Nonrheumatic pulmonary valve disorder, unspecified: Secondary | ICD-10-CM | POA: Insufficient documentation

## 2012-04-28 DIAGNOSIS — F17200 Nicotine dependence, unspecified, uncomplicated: Secondary | ICD-10-CM | POA: Insufficient documentation

## 2012-04-28 DIAGNOSIS — I369 Nonrheumatic tricuspid valve disorder, unspecified: Secondary | ICD-10-CM | POA: Insufficient documentation

## 2012-04-28 NOTE — Progress Notes (Signed)
Echocardiogram performed.  

## 2012-05-07 ENCOUNTER — Telehealth: Payer: Self-pay | Admitting: Internal Medicine

## 2012-05-08 NOTE — Telephone Encounter (Signed)
Patient needs a wound care specialist and pat is looking for a place to take her. She has a pressure sore on her ankle  I have given her the number to [facility] Berline Lopes, RN [phone] to see if she has any recommendations or could see her.

## 2012-05-18 ENCOUNTER — Inpatient Hospital Stay (HOSPITAL_COMMUNITY)
Admission: EM | Admit: 2012-05-18 | Discharge: 2012-05-23 | DRG: 540 | Disposition: A | Discharge status: Skilled Nursing Facility | Payer: Medicare Other | Attending: Family Medicine | Admitting: Family Medicine

## 2012-05-18 DIAGNOSIS — E785 Hyperlipidemia, unspecified: Secondary | ICD-10-CM | POA: Diagnosis present

## 2012-05-18 DIAGNOSIS — E871 Hypo-osmolality and hyponatremia: Secondary | ICD-10-CM | POA: Diagnosis present

## 2012-05-18 DIAGNOSIS — L03119 Cellulitis of unspecified part of limb: Secondary | ICD-10-CM | POA: Diagnosis present

## 2012-05-18 DIAGNOSIS — Z993 Dependence on wheelchair: Secondary | ICD-10-CM

## 2012-05-18 DIAGNOSIS — Z791 Long term (current) use of non-steroidal anti-inflammatories (NSAID): Secondary | ICD-10-CM

## 2012-05-18 DIAGNOSIS — Z79899 Other long term (current) drug therapy: Secondary | ICD-10-CM

## 2012-05-18 DIAGNOSIS — F17200 Nicotine dependence, unspecified, uncomplicated: Secondary | ICD-10-CM | POA: Diagnosis present

## 2012-05-18 DIAGNOSIS — D72819 Decreased white blood cell count, unspecified: Secondary | ICD-10-CM

## 2012-05-18 DIAGNOSIS — M86169 Other acute osteomyelitis, unspecified tibia and fibula: Principal | ICD-10-CM | POA: Diagnosis present

## 2012-05-18 DIAGNOSIS — L02619 Cutaneous abscess of unspecified foot: Secondary | ICD-10-CM | POA: Diagnosis present

## 2012-05-18 DIAGNOSIS — Z681 Body mass index (BMI) 19 or less, adult: Secondary | ICD-10-CM

## 2012-05-18 DIAGNOSIS — R634 Abnormal weight loss: Secondary | ICD-10-CM | POA: Diagnosis present

## 2012-05-18 DIAGNOSIS — Z7982 Long term (current) use of aspirin: Secondary | ICD-10-CM

## 2012-05-18 DIAGNOSIS — I10 Essential (primary) hypertension: Secondary | ICD-10-CM | POA: Diagnosis present

## 2012-05-18 DIAGNOSIS — D539 Nutritional anemia, unspecified: Secondary | ICD-10-CM | POA: Diagnosis present

## 2012-05-18 DIAGNOSIS — I739 Peripheral vascular disease, unspecified: Secondary | ICD-10-CM | POA: Diagnosis present

## 2012-05-18 DIAGNOSIS — D649 Anemia, unspecified: Secondary | ICD-10-CM

## 2012-05-18 DIAGNOSIS — D473 Essential (hemorrhagic) thrombocythemia: Secondary | ICD-10-CM

## 2012-05-18 DIAGNOSIS — Z66 Do not resuscitate: Secondary | ICD-10-CM | POA: Diagnosis present

## 2012-05-18 DIAGNOSIS — L97509 Non-pressure chronic ulcer of other part of unspecified foot with unspecified severity: Secondary | ICD-10-CM | POA: Diagnosis present

## 2012-05-18 DIAGNOSIS — I259 Chronic ischemic heart disease, unspecified: Secondary | ICD-10-CM | POA: Diagnosis present

## 2012-05-18 DIAGNOSIS — Z86718 Personal history of other venous thrombosis and embolism: Secondary | ICD-10-CM

## 2012-05-18 DIAGNOSIS — I4891 Unspecified atrial fibrillation: Secondary | ICD-10-CM | POA: Diagnosis present

## 2012-05-18 DIAGNOSIS — I82409 Acute embolism and thrombosis of unspecified deep veins of unspecified lower extremity: Secondary | ICD-10-CM

## 2012-05-18 NOTE — ED Notes (Signed)
RUE:[id number]<JW> Expected date:05/18/12<BR> Expected time:11:34 PM<BR> Means of arrival:Ambulance<BR> Comments:<BR> From [facility] Ankle pain/sore pain 10/10

## 2012-05-18 NOTE — ED Notes (Signed)
Pt arrived via EMS from [facility] , per EMS reports pt has rt ankle pain. She has a sore on her ankle. She receives wound care  at the living center. There was an infacililty XR on the R ankle that where WNL on 10/24.  VS : BP 124/76  P 80  RR 16

## 2012-05-19 ENCOUNTER — Inpatient Hospital Stay (HOSPITAL_COMMUNITY): Payer: Medicare Other

## 2012-05-19 ENCOUNTER — Encounter (HOSPITAL_COMMUNITY): Payer: Self-pay | Admitting: Internal Medicine

## 2012-05-19 ENCOUNTER — Emergency Department (HOSPITAL_COMMUNITY): Payer: Medicare Other

## 2012-05-19 DIAGNOSIS — I4891 Unspecified atrial fibrillation: Secondary | ICD-10-CM

## 2012-05-19 DIAGNOSIS — M79609 Pain in unspecified limb: Secondary | ICD-10-CM

## 2012-05-19 DIAGNOSIS — I739 Peripheral vascular disease, unspecified: Secondary | ICD-10-CM

## 2012-05-19 DIAGNOSIS — L03119 Cellulitis of unspecified part of limb: Secondary | ICD-10-CM

## 2012-05-19 DIAGNOSIS — D72819 Decreased white blood cell count, unspecified: Secondary | ICD-10-CM | POA: Diagnosis present

## 2012-05-19 DIAGNOSIS — D649 Anemia, unspecified: Secondary | ICD-10-CM | POA: Diagnosis present

## 2012-05-19 DIAGNOSIS — M7989 Other specified soft tissue disorders: Secondary | ICD-10-CM

## 2012-05-19 HISTORY — DX: Cellulitis of unspecified part of limb: L03.119

## 2012-05-19 LAB — CBC
Hemoglobin: 8.3 g/dL — ABNORMAL LOW (ref 12.0–15.0)
MCHC: 34.7 g/dL (ref 30.0–36.0)
Platelets: 304 10*3/uL (ref 150–400)
RDW: 23.7 % — ABNORMAL HIGH (ref 11.5–15.5)

## 2012-05-19 LAB — BASIC METABOLIC PANEL
GFR calc Af Amer: 54 mL/min — ABNORMAL LOW (ref >90)
GFR calc non Af Amer: 47 mL/min — ABNORMAL LOW (ref >90)
Glucose, Bld: 104 mg/dL — ABNORMAL HIGH (ref 70–99)
Potassium: 4.8 mEq/L (ref 3.5–5.1)
Sodium: 129 mEq/L — ABNORMAL LOW (ref 135–145)

## 2012-05-19 LAB — PROTIME-INR
INR: 1.24 (ref 0.00–1.49)
Prothrombin Time: 15.4 seconds — ABNORMAL HIGH (ref 11.6–15.2)

## 2012-05-19 LAB — DIFFERENTIAL
Eosinophils Relative: 1 % (ref 0–5)
Lymphocytes Relative: 22 % (ref 12–46)
Lymphs Abs: 0.4 10*3/uL — ABNORMAL LOW (ref 0.7–4.0)
Neutro Abs: 1.4 10*3/uL — ABNORMAL LOW (ref 1.7–7.7)

## 2012-05-19 LAB — DIGOXIN LEVEL: Digoxin Level: 1.5 ng/mL (ref 0.8–2.0)

## 2012-05-19 LAB — OCCULT BLOOD, POC DEVICE: Fecal Occult Bld: NEGATIVE

## 2012-05-19 MED ORDER — FLORANEX PO PACK
1.0000 g | PACK | Freq: Two times a day (BID) | ORAL | Status: DC
  Administered 2012-05-19 – 2012-05-23 (×9): 1 g via ORAL
  Filled 2012-05-19 (×10): qty 1

## 2012-05-19 MED ORDER — CHOLESTYRAMINE LIGHT 4 G PO PACK
4.0000 g | PACK | Freq: Two times a day (BID) | ORAL | Status: DC
  Administered 2012-05-19 – 2012-05-22 (×8): 4 g via ORAL
  Filled 2012-05-19 (×10): qty 1

## 2012-05-19 MED ORDER — DIGOXIN 125 MCG PO TABS
0.1250 mg | ORAL_TABLET | Freq: Every day | ORAL | Status: DC
  Administered 2012-05-19 – 2012-05-23 (×5): 0.125 mg via ORAL
  Filled 2012-05-19 (×5): qty 1

## 2012-05-19 MED ORDER — AMLODIPINE BESYLATE 10 MG PO TABS
10.0000 mg | ORAL_TABLET | Freq: Every day | ORAL | Status: DC
  Administered 2012-05-19 – 2012-05-23 (×5): 10 mg via ORAL
  Filled 2012-05-19 (×5): qty 1

## 2012-05-19 MED ORDER — ALPRAZOLAM 0.25 MG PO TABS
0.2500 mg | ORAL_TABLET | Freq: Two times a day (BID) | ORAL | Status: DC
  Administered 2012-05-19 – 2012-05-22 (×8): 0.25 mg via ORAL
  Filled 2012-05-19 (×8): qty 1

## 2012-05-19 MED ORDER — HYDROCODONE-ACETAMINOPHEN 5-325 MG PO TABS
1.0000 | ORAL_TABLET | Freq: Four times a day (QID) | ORAL | Status: DC
  Administered 2012-05-19 – 2012-05-22 (×13): 1 via ORAL
  Filled 2012-05-19 (×14): qty 1

## 2012-05-19 MED ORDER — OXYCODONE-ACETAMINOPHEN 5-325 MG PO TABS
1.0000 | ORAL_TABLET | Freq: Once | ORAL | Status: AC
  Administered 2012-05-19: 1 via ORAL
  Filled 2012-05-19: qty 1

## 2012-05-19 MED ORDER — ALUM & MAG HYDROXIDE-SIMETH 200-200-20 MG/5ML PO SUSP
15.0000 mL | Freq: Three times a day (TID) | ORAL | Status: DC
  Administered 2012-05-19: 17:00:00 via ORAL
  Administered 2012-05-19 – 2012-05-20 (×4): 15 mL via ORAL
  Administered 2012-05-20: 10:00:00 via ORAL
  Administered 2012-05-21 – 2012-05-23 (×8): 15 mL via ORAL
  Filled 2012-05-19 (×13): qty 30

## 2012-05-19 MED ORDER — CLINDAMYCIN PHOSPHATE 600 MG/50ML IV SOLN
600.0000 mg | Freq: Three times a day (TID) | INTRAVENOUS | Status: DC
  Administered 2012-05-19 – 2012-05-20 (×5): 600 mg via INTRAVENOUS
  Filled 2012-05-19 (×5): qty 50

## 2012-05-19 MED ORDER — ACETAMINOPHEN 650 MG RE SUPP: 650.0000 mg | Freq: Four times a day (QID) | RECTAL | Status: DC | PRN

## 2012-05-19 MED ORDER — MORPHINE SULFATE 2 MG/ML IJ SOLN
1.0000 mg | INTRAMUSCULAR | Status: DC | PRN
  Administered 2012-05-19 – 2012-05-23 (×8): 1 mg via INTRAVENOUS
  Filled 2012-05-19 (×8): qty 1

## 2012-05-19 MED ORDER — ADULT MULTIVITAMIN W/MINERALS CH
1.0000 | ORAL_TABLET | Freq: Every day | ORAL | Status: DC
  Administered 2012-05-19 – 2012-05-23 (×5): 1 via ORAL
  Filled 2012-05-19 (×5): qty 1

## 2012-05-19 MED ORDER — MORPHINE SULFATE ER 30 MG PO TBCR
30.0000 mg | EXTENDED_RELEASE_TABLET | Freq: Two times a day (BID) | ORAL | Status: DC
  Administered 2012-05-19 – 2012-05-22 (×8): 30 mg via ORAL
  Filled 2012-05-19 (×8): qty 1

## 2012-05-19 MED ORDER — CLINDAMYCIN PHOSPHATE 900 MG/50ML IV SOLN
900.0000 mg | Freq: Once | INTRAVENOUS | Status: AC
  Administered 2012-05-19: 900 mg via INTRAVENOUS
  Filled 2012-05-19: qty 50

## 2012-05-19 MED ORDER — ENOXAPARIN SODIUM 40 MG/0.4ML ~~LOC~~ SOLN
40.0000 mg | SUBCUTANEOUS | Status: DC
  Administered 2012-05-19 – 2012-05-20 (×2): 40 mg via SUBCUTANEOUS
  Filled 2012-05-19 (×2): qty 0.4

## 2012-05-19 MED ORDER — GABAPENTIN 300 MG PO CAPS
300.0000 mg | ORAL_CAPSULE | Freq: Two times a day (BID) | ORAL | Status: DC
  Administered 2012-05-19 – 2012-05-23 (×9): 300 mg via ORAL
  Filled 2012-05-19 (×10): qty 1

## 2012-05-19 MED ORDER — PANTOPRAZOLE SODIUM 40 MG PO TBEC
40.0000 mg | DELAYED_RELEASE_TABLET | Freq: Every day | ORAL | Status: DC
  Administered 2012-05-19 – 2012-05-23 (×5): 40 mg via ORAL
  Filled 2012-05-19 (×5): qty 1

## 2012-05-19 MED ORDER — ISOSORBIDE MONONITRATE ER 60 MG PO TB24
60.0000 mg | ORAL_TABLET | Freq: Every day | ORAL | Status: DC
  Administered 2012-05-19 – 2012-05-23 (×5): 60 mg via ORAL
  Filled 2012-05-19 (×5): qty 1

## 2012-05-19 MED ORDER — ASPIRIN EC 81 MG PO TBEC
81.0000 mg | DELAYED_RELEASE_TABLET | Freq: Every day | ORAL | Status: DC
  Administered 2012-05-19 – 2012-05-23 (×5): 81 mg via ORAL
  Filled 2012-05-19 (×5): qty 1

## 2012-05-19 MED ORDER — DULOXETINE HCL 60 MG PO CPEP
60.0000 mg | ORAL_CAPSULE | Freq: Every day | ORAL | Status: DC
  Administered 2012-05-19 – 2012-05-23 (×5): 60 mg via ORAL
  Filled 2012-05-19 (×5): qty 1

## 2012-05-19 MED ORDER — HYDROXYZINE HCL 25 MG PO TABS
25.0000 mg | ORAL_TABLET | Freq: Three times a day (TID) | ORAL | Status: DC | PRN
  Filled 2012-05-19: qty 1

## 2012-05-19 MED ORDER — ONDANSETRON HCL 4 MG/2ML IJ SOLN: 4.0000 mg | Freq: Four times a day (QID) | INTRAMUSCULAR | Status: DC | PRN

## 2012-05-19 MED ORDER — METOPROLOL TARTRATE 25 MG PO TABS
25.0000 mg | ORAL_TABLET | Freq: Every day | ORAL | Status: DC
  Administered 2012-05-19 – 2012-05-23 (×5): 25 mg via ORAL
  Filled 2012-05-19 (×5): qty 1

## 2012-05-19 MED ORDER — ONDANSETRON HCL 4 MG PO TABS: 4.0000 mg | ORAL_TABLET | Freq: Four times a day (QID) | ORAL | Status: DC | PRN

## 2012-05-19 MED ORDER — MORPHINE SULFATE 4 MG/ML IJ SOLN
2.0000 mg | Freq: Once | INTRAMUSCULAR | Status: AC
  Administered 2012-05-19: 2 mg via INTRAVENOUS
  Filled 2012-05-19: qty 1

## 2012-05-19 MED ORDER — VITAMIN D3 25 MCG (1000 UNIT) PO TABS
5000.0000 [IU] | ORAL_TABLET | Freq: Every day | ORAL | Status: DC
  Administered 2012-05-19 – 2012-05-23 (×5): 5000 [IU] via ORAL
  Filled 2012-05-19 (×5): qty 5

## 2012-05-19 MED ORDER — ACETAMINOPHEN 325 MG PO TABS
650.0000 mg | ORAL_TABLET | Freq: Four times a day (QID) | ORAL | Status: DC | PRN
  Filled 2012-05-19 (×2): qty 2

## 2012-05-19 MED ORDER — POLYETHYLENE GLYCOL 3350 17 G PO PACK
17.0000 g | PACK | ORAL | Status: DC
  Administered 2012-05-19 – 2012-05-23 (×3): 17 g via ORAL
  Filled 2012-05-19 (×3): qty 1

## 2012-05-19 MED ORDER — SODIUM CHLORIDE 0.9 % IJ SOLN
3.0000 mL | Freq: Two times a day (BID) | INTRAMUSCULAR | Status: DC
  Administered 2012-05-19 – 2012-05-22 (×7): 3 mL via INTRAVENOUS

## 2012-05-19 NOTE — Progress Notes (Signed)
Bilateral:  No evidence of DVT, superficial thrombosis, or Baker's Cyst.  VASCULAR LAB PRELIMINARY  ARTERIAL  ABI completed:    RIGHT    LEFT    PRESSURE WAVEFORM  PRESSURE WAVEFORM  BRACHIAL 153 T BRACHIAL 169 T  DP 63  Severe DM DP 71 Severe DM  AT   AT    PT 71 Severe DM PT 95 M  PER   PER    GREAT TOE  NA GREAT TOE  NA    RIGHT LEFT  ABI 0.42 0.56     Tamara Turner, 05/19/2012, 9:08 AM

## 2012-05-19 NOTE — Consult Note (Signed)
Vascular and Vein Specialist of [facility]   Patient name: Tamara Turner MRN: [id number] DOB: Apr 24, 1922 Sex: female     Reason for referral:  Chief Complaint  Patient presents with  . Ankle Pain    HISTORY OF PRESENT ILLNESS: I've been consult in to evaluate adequacy of arterial flow in right foot. The patient can answer yes and no questions but is unable to say where she is and where she came from. Apparently this is been difficult to determine. When I spoke with [facility] it was unknown whether she was ambulatory and where she lives prior to admission. The patient can provide no history. She does report some left heel pain when asked about her symptoms but denies any right foot pain. She does have a ulceration over her right lateral malleolus. The remainder of her history is obtained through the chart. There is no prior history of peripheral bypass but clearly by physical exam the patient has a prior right femoral-popliteal bypass from her vein harvest. She does not have a sternotomy incision so this was not used for coronary bypass.  Past Medical History  Diagnosis Date  . Vascular abnormality   . DVT (deep venous thrombosis)   . Alcoholism   . Depression   . Hypertension   . UTI (lower urinary tract infection)   . Chronic back pain   . Arthritis     all over  . Renal disorder   . Heart disease   . Hyperlipidemia   . Phlebitis   . UTI (urinary tract infection)     Past Surgical History  Procedure Date  . Thrombectomy   . Shoulder surgery   . Hand surgery   . Appendectomy   . Coronary angioplasty with stent placement   . Spine surgery     x 3    History   Social History  . Marital Status: Widowed    Spouse Name: N/A    Number of Children: 0  . Years of Education: N/A   Occupational History  . Retired     Social History Main Topics  . Smoking status: Current Every Day Smoker -- 0.2 packs/day    Types: Cigarettes  . Smokeless tobacco: Never  Used  . Alcohol Use: No  . Drug Use: No  . Sexually Active: Not on file   Other Topics Concern  . Not on file   Social History Narrative   Widowed, moved to [place] to assisted living in August 2013 after many years in [place] Daily caffeine 3 drinks dailyNext of kin is her niece Janeece Agee    Family History  Problem Relation Age of Onset  . Colon cancer Neg Hx   . Pancreatic cancer Sister     Allergies as of 05/18/2012 - Review Complete 04/01/2012  Allergen Reaction Noted  . Penicillins Hives 02/14/2012  . Sulfa antibiotics Nausea Only 02/14/2012    No current facility-administered medications on file prior to encounter.   Current Outpatient Prescriptions on File Prior to Encounter  Medication Sig Dispense Refill  . ALPRAZolam (XANAX) 0.25 MG tablet Take 0.25 mg by mouth 2 (two) times daily.      Marland Kitchen alum & mag hydroxide-simeth (MI-ACID) 200-200-20 MG/5ML suspension Take 15 mLs by mouth 3 (three) times daily.      Marland Kitchen amLODipine (NORVASC) 10 MG tablet Take 10 mg by mouth daily.      Marland Kitchen aspirin 81 MG tablet Take 81 mg by mouth daily.      . Calcium  Carb-Vit D-C-E-Mineral 600 MG TABS Take 1 tablet by mouth daily.      . Cholecalciferol (VITAMIN D-3) 5000 UNITS TABS Take 1 capsule by mouth daily.       . cholestyramine light (PREVALITE) 4 G packet Take 4 g by mouth 2 (two) times daily.      . digoxin (LANOXIN) 0.125 MG tablet Take 0.125 mg by mouth daily.      . DULoxetine (CYMBALTA) 60 MG capsule Take 60 mg by mouth daily.      Marland Kitchen gabapentin (NEURONTIN) 300 MG capsule Take 300 mg by mouth 2 (two) times daily.       . hydroxyurea (HYDREA) 500 MG capsule Take 500 mg by mouth 2 (two) times daily. May take with food to minimize GI side effects.      . hydrOXYzine (ATARAX/VISTARIL) 25 MG tablet Take 25 mg by mouth every 8 (eight) hours as needed. Tension      . isosorbide mononitrate (IMDUR) 60 MG 24 hr tablet Take 60 mg by mouth daily.      Marland Kitchen lactobacillus (FLORANEX/LACTINEX)  PACK Take 1 g by mouth 2 (two) times daily.      Marland Kitchen loperamide (IMODIUM) 2 MG capsule Take 4 mg by mouth as needed. Take 2 caps by mouth after first loose stool then 1 cap by mouth after each subsequent      . metoprolol tartrate (LOPRESSOR) 25 MG tablet Take 25 mg by mouth daily.       Marland Kitchen morphine (MS CONTIN) 30 MG 12 hr tablet Take 30 mg by mouth 2 (two) times daily.      . Multiple Vitamins-Minerals (MULTIVITAL) tablet Take 1 tablet by mouth daily.      . pantoprazole (PROTONIX) 40 MG tablet Take 40 mg by mouth daily.      Marland Kitchen spironolactone (ALDACTONE) 25 MG tablet Take 25 mg by mouth daily.      . polyethylene glycol (MIRALAX / GLYCOLAX) packet Take 17 g by mouth every Monday, Wednesday, and Friday.          REVIEW OF SYSTEMS:  Positives indicated with an "X"  Reviewed in her history and physical with nothing to add  PHYSICAL EXAMINATION:  General: The patient is a well-nourished female, in no acute distress. Vital signs are BP 148/51  Pulse 80  Temp 98 F (36.7 C) (Oral)  Resp 18  Wt 97 lb 14.2 oz (44.4 kg)  SpO2 91% Pulmonary: There is a good air exchange bilaterally without wheezing or rales. Abdomen: Soft and non-tender with normal pitch bowel sounds. Musculoskeletal: There are no major deformities.  There is no significant extremity pain. Neurologic: No focal weakness or paresthesias are detected, Skin: No ulcerations of left foot. She does have a 1 cm open ulceration over her lateral malleolus with no surrounding erythema Psychiatric: The patient can only answer simple yes or no questions Cardiovascular: There is a regular rate and rhythm without significant murmur appreciated. Pulse status: 2+ femoral pulses bilaterally. The patient does have a palpable popliteal pulse on the right with no tibial pulses. Incisions appear that she has had a prior right femoral-popliteal bypass   Vascular Lab Studies:   Lower extremity arterial studies reveal ankle arm index of 0.42 on the  right and 0.56 on the left  Impression and Plan:  Right ankle ulcer. Unknown whether this patient ambulates or not. Very confused and this may be her baseline. She does have a palpable right popliteal pulse and scars would indicate a  prior right femoral-popliteal bypass. Do not feel that further invasive evaluation is needed. She should have adequate flow to heal this with local wound care. If she is nonambulatory which appears to be the case she would need an amputation if this continues to be nonhealing. Will not follow actively. Please consult we can provide further assistance    EARLY, TODD [facility] [place] Office: [phone]

## 2012-05-19 NOTE — Progress Notes (Signed)
Pt seen and examined at bedside. Pt appears comfortable and hemodynamically stable. I have reviewed vitals and current blood work. MRI of the right ankle indicated skin ulceration over the lateral malleolus with marrow edema in the  distal 2 cm of the fibula consistent osteomyelitis. Will continue clindamycin IV as already started by Dr. Toniann Fail. I have also called vascular surgery for further recommendations on management. ABI: right lower extremity 0.42, left lower extremity 0.56. Will obtain CBC and BMP in AM.  Debbora Presto, MD  [facility] Pager [phone]  If 7PM-7AM, please contact night-coverage [email or web address] Password [email or web address]

## 2012-05-19 NOTE — H&P (Addendum)
Tamara Turner is an 78 y.o. female.   Patient was seen and examined on May 19, 2012. PCP - the physician at the assisted-living facility. The patient and her niece are unable to recall the name of the physician. Chief Complaint: Right foot ulcer and pain. HPI: 78 year-old female who had just recently moved from [place] to [place] to be closer to her niece presents to the ER with complaints of worsening pain in the right foot over the last 24 hours. Patient had developed a small ulcer on the lateral aspect of her right foot over the last one month. Patient states also may have developed when she had injured that area on the wheelchair. Since then it has slowly become and also which is nonhealing. Lasted for hours that area has become very painful with swelling. Denies any fever chills. In the ER x-ray shows soft tissue swelling. In addition patient also has been found to have new anemia and leukopenia when compared to her labs this August that is 5 months ago.  Patient states she used to have a history of DVT and also clots in the right leg. She had required surgeries to remove the clot from the leg.  Patient has been following with Dr. Leone Payor for nausea. Dr. Leone Payor had done EGD which was unremarkable. CT abdomen and pelvis were showing bile duct dilatation. And Dr. Leone Payor is planning further studies. Patient has also follow with Dr. [name] given her history of CAD and atrial fibrillation. Presently has no chest pain or palpitations.  Past Medical History  Diagnosis Date  . Vascular abnormality   . DVT (deep venous thrombosis)   . Alcoholism   . Depression   . Hypertension   . UTI (lower urinary tract infection)   . Chronic back pain   . Arthritis     all over  . Renal disorder   . Heart disease   . Hyperlipidemia   . Phlebitis   . UTI (urinary tract infection)     Past Surgical History  Procedure Date  . Thrombectomy   . Shoulder surgery   . Hand surgery   .  Appendectomy   . Coronary angioplasty with stent placement   . Spine surgery     x 3    Family History  Problem Relation Age of Onset  . Colon cancer Neg Hx   . Pancreatic cancer Sister    Social History:  reports that she has been smoking Cigarettes.  She has been smoking about .25 packs per day. She has never used smokeless tobacco. She reports that she does not drink alcohol or use illicit drugs.  Allergies:  Allergies  Allergen Reactions  . Penicillins Hives  . Sulfa Antibiotics Nausea Only     (Not in a hospital admission)  Results for orders placed during the hospital encounter of 05/18/12 (from the past 48 hour(s))  CBC     Status: Abnormal   Collection Time   05/19/12  1:30 AM      Component Value Range Comment   WBC 2.2 (*) 4.0 - 10.5 K/uL    RBC 1.82 (*) 3.87 - 5.11 MIL/uL    Hemoglobin 8.3 (*) 12.0 - 15.0 g/dL    HCT 16.1 (*) 09.6 - 46.0 %    MCV 131.3 (*) 78.0 - 100.0 fL    MCH 45.6 (*) 26.0 - 34.0 pg    MCHC 34.7  30.0 - 36.0 g/dL    RDW 04.5 (*) 40.9 - 15.5 %  Platelets 304  150 - 400 K/uL   BASIC METABOLIC PANEL     Status: Abnormal   Collection Time   05/19/12  1:30 AM      Component Value Range Comment   Sodium 129 (*) 135 - 145 mEq/L    Potassium 4.8  3.5 - 5.1 mEq/L    Chloride 93 (*) 96 - 112 mEq/L    CO2 29  19 - 32 mEq/L    Glucose, Bld 104 (*) 70 - 99 mg/dL    BUN 27 (*) 6 - 23 mg/dL    Creatinine, Ser 6.21  0.50 - 1.10 mg/dL    Calcium 9.8  8.4 - 30.8 mg/dL    GFR calc non Af Amer 47 (*) >90 mL/min    GFR calc Af Amer 54 (*) >90 mL/min   PROTIME-INR     Status: Abnormal   Collection Time   05/19/12  3:00 AM      Component Value Range Comment   Prothrombin Time 15.4 (*) 11.6 - 15.2 seconds    INR 1.24  0.00 - 1.49   OCCULT BLOOD, POC DEVICE     Status: Normal   Collection Time   05/19/12  4:12 AM      Component Value Range Comment   Fecal Occult Bld NEGATIVE      Dg Ankle Complete Right  05/19/2012  *RADIOLOGY REPORT*  Clinical  Data: Diffuse ankle pain and swelling.  Small wound overlying the lateral malleolus.  RIGHT ANKLE - COMPLETE 3+ VIEW  Comparison: None.  Findings: Mild lateral soft tissue swelling is seen.  No evidence of subcutaneous emphysema or radiopaque foreign body.  Generalized osteopenia is noted.  No evidence of acute fracture or dislocation.  No evidence of osteolysis or periosteal reaction.  No other focal bone lesion identified.  IMPRESSION:  1.  Lateral soft tissue swelling.  No acute fracture or focal osseous abnormality. 2.  Osteopenia.   Original Report Authenticated By: Myles Rosenthal, M.D.     Review of Systems  Constitutional: Negative.   HENT: Negative.   Eyes: Negative.   Respiratory: Negative.   Cardiovascular: Negative.   Gastrointestinal: Negative.   Genitourinary: Negative.   Musculoskeletal:       Right leg pain and ulcer.  Skin: Negative.   Neurological: Negative.   Endo/Heme/Allergies: Negative.   Psychiatric/Behavioral: Negative.     Blood pressure 132/55, pulse 64, temperature 98.2 F (36.8 C), temperature source Oral, resp. rate 15, SpO2 98.00%. Physical Exam  Constitutional: She is oriented to person, place, and time. She appears well-developed and well-nourished. No distress.  HENT:  Head: Normocephalic and atraumatic.  Right Ear: External ear normal.  Left Ear: External ear normal.  Nose: Nose normal.  Mouth/Throat: Oropharynx is clear and moist. No oropharyngeal exudate.  Eyes: Conjunctivae normal are normal. Pupils are equal, round, and reactive to light. Right eye exhibits no discharge. Left eye exhibits no discharge. No scleral icterus.  Neck: Normal range of motion. Neck supple.  Cardiovascular: Normal rate and regular rhythm.   Respiratory: Effort normal and breath sounds normal. No respiratory distress. She has no wheezes. She has no rales.  GI: Soft. Bowel sounds are normal. She exhibits no distension. There is no tenderness. There is no rebound.    Musculoskeletal:       Ulcer on the lateral aspect of right foot. Measures around half centimeter with surrounding edema and no active discharge. Poor pulses.  Neurological: She is alert and oriented to person, place,  and time.       Moves all extremities.  Skin: She is not diaphoretic.     Assessment/Plan #1. Right foot ulcer with possible cellulitis with pain - at this time patient has been started on clindamycin for possible cellulitis which we will continue. Get MRI of the right ankle and foot to rule out osteomyelitis. Patient has very poor pulses and I have ordered ABI and also venous Doppler of the lower extremities. Continue pain relief medications. #2. Anemia and leukopenia - patient is on hydroxyurea for possible essential thrombocythemia. I am repeating a CBC with differential. We'll hold hydroxyurea for now. If repeat labs do show anemia and leukopenia may consult hematologist. #3. Possible peripheral artery disease and history of DVT - see #1. Continue aspirin. #4. CAD status post stenting - denies any chest pain or shortness of breath. Continue aspirin. #5. Hypertension - continue present medications. Holding spironolactone because of hyponatremia. Follow BMET. #6. Ongoing tobacco abuse - advised to quit smoking. #7. Atrial fibrillation presently rate controlled - patient was felt to be not a candidate for Coumadin as per recent cardiology notes. Continue home medications. Check digoxin levels. #8. Nausea with abnormal CT abdomen pelvis - further workup per Dr. Leone Payor.  CODE STATUS - DO NOT RESUSCITATE as confirmed the patient and patient's niece who is also patient's health care power of attorney.  Eduard Clos 05/19/2012, 6:34 AM

## 2012-05-19 NOTE — ED Notes (Signed)
Ulcer to rt ankle causing 8/10 pain. +1 edema assessed to rt lower extremity.

## 2012-05-19 NOTE — ED Provider Notes (Signed)
History     CSN: [id number]  Arrival date & time 05/18/12  2345   First MD Initiated Contact with Patient 05/19/12 0003      Chief Complaint  Patient presents with  . Ankle Pain    (Consider location/radiation/quality/duration/timing/severity/associated sxs/prior treatment) HPI Comments: [name] presents form a local assisted living  Facility for evaluation of left ankle pain.  She reports she developed an ulcer on her left ankle from repeated rubbing and trauma against a hard point on her wheel chair.  The wound has been cared for and she has undergone regular dressing changes.  Per report, it had significantly shrunk in size over the last 2 weeks.  She has been off antibiotics for some time also.  She has had discomfort since the wound developed but over the last 24 hours she states she is having severe localized pain.  She also notes swelling to the ankle and adjacent foot.  She denies fever and states she feels fine other than the pain.    The history is provided by the patient. No language interpreter was used.    Past Medical History  Diagnosis Date  . Vascular abnormality   . DVT (deep venous thrombosis)   . Alcoholism   . Depression   . Hypertension   . UTI (lower urinary tract infection)   . Chronic back pain   . Arthritis     all over  . Renal disorder   . Heart disease   . Hyperlipidemia   . Phlebitis   . UTI (urinary tract infection)     Past Surgical History  Procedure Date  . Thrombectomy   . Shoulder surgery   . Hand surgery   . Appendectomy   . Coronary angioplasty with stent placement   . Spine surgery     x 3    Family History  Problem Relation Age of Onset  . Colon cancer Neg Hx     History  Substance Use Topics  . Smoking status: Current Every Day Smoker -- 0.2 packs/day    Types: Cigarettes  . Smokeless tobacco: Never Used  . Alcohol Use: No    OB History    Grav Para Term Preterm Abortions TAB SAB Ect Mult Living                  Review of Systems  All other systems reviewed and are negative.    Allergies  Penicillins and Sulfa antibiotics  Home Medications   Current Outpatient Rx  Name  Route  Sig  Dispense  Refill  . ALPRAZOLAM 0.25 MG PO TABS   Oral   Take 0.25 mg by mouth 2 (two) times daily.         Marland Kitchen ALUM & MAG HYDROXIDE-SIMETH 200-200-20 MG/5ML PO SUSP   Oral   Take 15 mLs by mouth 3 (three) times daily.         Marland Kitchen AMLODIPINE BESYLATE 10 MG PO TABS   Oral   Take 10 mg by mouth daily.         . ASPIRIN 81 MG PO TABS   Oral   Take 81 mg by mouth daily.         Marland Kitchen CALCIUM CARB-VIT D-C-E-MINERAL 600 MG PO TABS   Oral   Take 1 tablet by mouth daily.         Marland Kitchen VITAMIN D-3 5000 UNITS PO TABS   Oral   Take 1 capsule by mouth daily.          Marland Kitchen  CHOLESTYRAMINE LIGHT 4 G PO PACK   Oral   Take 4 g by mouth 2 (two) times daily.         Marland Kitchen DIGOXIN 0.125 MG PO TABS   Oral   Take 0.125 mg by mouth daily.         . DULOXETINE HCL 60 MG PO CPEP   Oral   Take 60 mg by mouth daily.         Marland Kitchen GABAPENTIN 300 MG PO CAPS   Oral   Take 300 mg by mouth 2 (two) times daily.          Marland Kitchen HYDROCODONE-ACETAMINOPHEN 5-325 MG PO TABS   Oral   Take 1 tablet by mouth 4 (four) times daily. Pain         . HYDROXYUREA 500 MG PO CAPS   Oral   Take 500 mg by mouth 2 (two) times daily. May take with food to minimize GI side effects.         Marland Kitchen HYDROXYZINE HCL 25 MG PO TABS   Oral   Take 25 mg by mouth every 8 (eight) hours as needed. Tension         . ISOSORBIDE MONONITRATE ER 60 MG PO TB24   Oral   Take 60 mg by mouth daily.         Marland Kitchen FLORANEX PO PACK   Oral   Take 1 g by mouth 2 (two) times daily.         Marland Kitchen LOPERAMIDE HCL 2 MG PO CAPS   Oral   Take 4 mg by mouth as needed. Take 2 caps by mouth after first loose stool then 1 cap by mouth after each subsequent         . METOPROLOL TARTRATE 25 MG PO TABS   Oral   Take 25 mg by mouth daily.          . MORPHINE  SULFATE ER 30 MG PO TBCR   Oral   Take 30 mg by mouth 2 (two) times daily.         . MULTIVITAL PO TABS   Oral   Take 1 tablet by mouth daily.         Marland Kitchen PANTOPRAZOLE SODIUM 40 MG PO TBEC   Oral   Take 40 mg by mouth daily.         Marland Kitchen SPIRONOLACTONE 25 MG PO TABS   Oral   Take 25 mg by mouth daily.         Marland Kitchen POLYETHYLENE GLYCOL 3350 PO PACK   Oral   Take 17 g by mouth every Monday, Wednesday, and Friday.            BP 132/55  Pulse 64  Temp 98.2 F (36.8 C) (Oral)  Resp 15  SpO2 98%  Physical Exam  Nursing note and vitals reviewed. Constitutional: She is oriented to person, place, and time. She appears well-developed and well-nourished. No distress.  HENT:  Head: Normocephalic and atraumatic.  Right Ear: External ear normal.  Left Ear: External ear normal.  Nose: Nose normal.  Mouth/Throat: Oropharynx is clear and moist. No oropharyngeal exudate.  Eyes: Conjunctivae normal are normal. Pupils are equal, round, and reactive to light. Right eye exhibits no discharge. Left eye exhibits no discharge. No scleral icterus.  Neck: Normal range of motion. Neck supple. No JVD present. No tracheal deviation present.  Cardiovascular: Normal rate, regular rhythm and intact distal pulses.   Occasional extrasystoles are  present. PMI is not displaced.  Exam reveals no gallop, no friction rub and no decreased pulses.   Murmur heard. Pulmonary/Chest: Effort normal. No stridor. Not tachypneic and not bradypneic. No respiratory distress. She has decreased breath sounds. She has no wheezes. She has no rhonchi. She has no rales. She exhibits no tenderness.  Abdominal: Soft. Bowel sounds are normal. She exhibits no distension and no mass. There is no tenderness. There is no rebound and no guarding.  Musculoskeletal: Normal range of motion. She exhibits edema and tenderness.       Right ankle: She exhibits swelling. She exhibits normal range of motion, no ecchymosis, no deformity, no  laceration and normal pulse. tenderness. Lateral malleolus tenderness found.       Feet:  Lymphadenopathy:    She has no cervical adenopathy.  Neurological: She is alert and oriented to person, place, and time.  Skin: Skin is warm and dry. Lesion noted. No abrasion, no bruising, no burn, no ecchymosis, no laceration, no petechiae and no rash noted. She is not diaphoretic. No cyanosis or erythema. No pallor. Nails show no clubbing.          Small circular ulcer with a central necrotic area overlying the right lateral malleolus.  Note edema and swelling of the surrounding tissues without erythema.  Ankle and proximal dorsal foot is tender to palpation.  Psychiatric: She has a normal mood and affect. Her behavior is normal.    ED Course  Procedures (including critical care time)   Labs Reviewed  CBC  BASIC METABOLIC PANEL  PROTIME-INR   No results found.   No diagnosis found.    MDM  Pt presents for evaluation of right ankle pain secondary to an ulcer.  She appears nontoxic, afebrile, note stable VS, NAD.  There is a dime-sized ulcer over the lateral malleolus with a central necrotic area and surrounding soft tissue edema.  Note some adherent purulent material but there is no surrounding erythema.  Will obtain basic labs, imaging of the ankle, and provide pain mgmnt.  Will reassess.   [id number].  Pt's exam is confounding.  She has a new onset anemia and leukocytopenia but does not appear toxic.  She is fecal occult blood negative. She has soft tissue swelling like cellulitis but no erythema and her pain is not proportional to her exam findings.  I consulted internal medicine for a bedside evaluation as I am uncertain as to whether she would benefit from an inpt stay.  I administered clindamycin shortly after her arrival secondary to concerns for cellulitis vs osteomyelitis.     Tobin Chad, MD 05/19/12 [phone]

## 2012-05-19 NOTE — ED Notes (Signed)
IV inserted to rt posterior forearm. Minimum blood return, however tolerated the fluids well until Morphine 2 mg was pushed. IV was discontinued. Pt had small red area, peri IV insertion. Will continue to monitor and notify physician.

## 2012-05-20 DIAGNOSIS — I82409 Acute embolism and thrombosis of unspecified deep veins of unspecified lower extremity: Secondary | ICD-10-CM

## 2012-05-20 LAB — CBC
HCT: 23.5 % — ABNORMAL LOW (ref 36.0–46.0)
Hemoglobin: 8.1 g/dL — ABNORMAL LOW (ref 12.0–15.0)
MCH: 45 pg — ABNORMAL HIGH (ref 26.0–34.0)
MCV: 130.6 fL — ABNORMAL HIGH (ref 78.0–100.0)
Platelets: 290 10*3/uL (ref 150–400)
RBC: 1.8 MIL/uL — ABNORMAL LOW (ref 3.87–5.11)

## 2012-05-20 LAB — BASIC METABOLIC PANEL
CO2: 26 mEq/L (ref 19–32)
Calcium: 9.3 mg/dL (ref 8.4–10.5)
Chloride: 94 mEq/L — ABNORMAL LOW (ref 96–112)
Glucose, Bld: 80 mg/dL (ref 70–99)
Sodium: 129 mEq/L — ABNORMAL LOW (ref 135–145)

## 2012-05-20 MED ORDER — BOOST / RESOURCE BREEZE PO LIQD
1.0000 | Freq: Three times a day (TID) | ORAL | Status: DC
  Administered 2012-05-20 – 2012-05-23 (×9): 1 via ORAL

## 2012-05-20 MED ORDER — VANCOMYCIN HCL IN DEXTROSE 1-5 GM/200ML-% IV SOLN
1000.0000 mg | Freq: Once | INTRAVENOUS | Status: AC
  Administered 2012-05-20: 1000 mg via INTRAVENOUS
  Filled 2012-05-20: qty 200

## 2012-05-20 MED ORDER — VANCOMYCIN HCL 1000 MG IV SOLR
750.0000 mg | INTRAVENOUS | Status: DC
  Administered 2012-05-21 – 2012-05-23 (×3): 750 mg via INTRAVENOUS
  Filled 2012-05-20 (×3): qty 750

## 2012-05-20 MED ORDER — TRAMADOL HCL 50 MG PO TABS
50.0000 mg | ORAL_TABLET | Freq: Four times a day (QID) | ORAL | Status: DC | PRN
  Administered 2012-05-21 – 2012-05-23 (×2): 50 mg via ORAL
  Filled 2012-05-20 (×2): qty 1

## 2012-05-20 MED ORDER — ENOXAPARIN SODIUM 30 MG/0.3ML ~~LOC~~ SOLN
30.0000 mg | SUBCUTANEOUS | Status: DC
  Administered 2012-05-21 – 2012-05-23 (×3): 30 mg via SUBCUTANEOUS
  Filled 2012-05-20 (×3): qty 0.3

## 2012-05-20 NOTE — Progress Notes (Signed)
ANTIBIOTIC CONSULT NOTE - INITIAL  Pharmacy Consult for Vancomycin Indication:   Allergies  Allergen Reactions  . Penicillins Hives  . Sulfa Antibiotics Nausea Only    Patient Measurements: Height: 5' 8.11" (173 cm) (last documented in October, 2013) Weight: 97 lb 14.2 oz (44.4 kg) IBW/kg (Calculated) : 64.15   Vital Signs: Temp: 98 F (36.7 C) (12/03 1307) Temp src: Oral (12/03 1307) BP: 128/64 mmHg (12/03 1307) Pulse Rate: 54  (12/03 1307) Intake/Output from previous day: 12/02 0701 - 12/03 0700 In: 200 [IV Piggyback:200] Out: 950 [Urine:950] Intake/Output from this shift:    Labs:  Basename 05/20/12 0528 05/19/12 0130  WBC 2.2* 2.2*  HGB 8.1* 8.3*  PLT 290 304  LABCREA -- --  CREATININE 0.80 1.04   Estimated Creatinine Clearance: 34.1 ml/min (by C-G formula based on Cr of 0.8). No results found for this basename: VANCOTROUGH:2,VANCOPEAK:2,VANCORANDOM:2,GENTTROUGH:2,GENTPEAK:2,GENTRANDOM:2,TOBRATROUGH:2,TOBRAPEAK:2,TOBRARND:2,AMIKACINPEAK:2,AMIKACINTROU:2,AMIKACIN:2, in the last 72 hours   Microbiology: No results found for this or any previous visit (from the past 720 hour(s)).  Medical History: Past Medical History  Diagnosis Date  . Vascular abnormality   . DVT (deep venous thrombosis)   . Alcoholism   . Depression   . Hypertension   . UTI (lower urinary tract infection)   . Chronic back pain   . Arthritis     all over  . Renal disorder   . Heart disease   . Hyperlipidemia   . Phlebitis   . UTI (urinary tract infection)     Assessment:  68 yof presented 12/01 with small ulcer on R  Foot x 1 month and increased pain. Pt was started on Clindamycin for cellulitis. MRI R ankle with "skin ulceration over the lateral malleolus with marrow edema in the distal 2 cm of the fibula consistent osteomyelitis. No abscess identified".  MD would like discontinue Clindamycin and add Vancomycin per pharmacy dosing.   Vascular surgery on board, no further invasive  eval needed.  Afebrile, WBC low at 2.2.  Scr wnl but CrCl only 34, normalized 55 given wt of only 44.4 kg.   Goal of Therapy:  Vancomycin trough level 15-20 mcg/ml  Plan:   Vancomycin 1g x 1 then 750 mg IV q24h  Pharmacy will f/u  Geoffry Paradise, PharmD, BCPS Pager: [phone] 1:34 PM Pharmacy #: [id number]

## 2012-05-20 NOTE — Progress Notes (Signed)
Tamara Turner ID: Tamara Turner, female   DOB: [birth year], 78 y.o.   MRN: [id number]  [facility] PROGRESS NOTE  Tamara Turner WUJ:[id number] DOB: 08-05-1921 DOA: 05/18/2012 PCP: Florentina Jenny, MD  Brief narrative: Pt is 78 yo female who just recently moved from [place] to [place] to be closer to her niece and who presented to ED with progressively worsening pain in the right foot that initially started 1 month prior to admission but much worse over 24 hours prior to admission. This is associated with progressive development of small ulcer on the lateral aspect of the right ankle. Please note that most of this history was provided by Tamara Turner's niece as Tamara Turner is not able to provide detailed history. Her knees Tamara Turner is mostly wheelchair and bedbound and unable to ambulate. One month prior to admission Tamara Turner has injured right ankle area while trying to get into the wheelchair. The wound has poorly he'll and Tamara Turner was given antibiotics with no significant improvement. Tamara Turner apparently has history of DVT and also clots in the right leg. She had required surgeries to remove the clot from the leg.  Of note, Tamara Turner has been following with Dr. Leone Payor for nausea. Dr. Leone Payor had done EGD which was unremarkable. Tamara Turner follows with Dr. [name] given her history of CAD and atrial fibrillation.   Principal Problem:  *Osteomyelitis of the right distal fibula - slight clinical improvement noted since, please see MRI findings of the right ankle below - Will change antibiotics from clindamycin to vancomycin - Continue supportive care for now with analgesia as needed and keep the extremity elevated - Also continue local wound care - Appreciate vascular surgery recommendations, please note no acute intervention is required at this point  - Please note that ABI. indicated right lower extremity 0.42, left lower extremity 0.56 - After discussion with Tamara Turner's knees, Tamara Turner was told in the past  that amputation may be needed in the future Active Problems:  Ischemic heart disease - This appears to be stable clinical condition at this point - Tamara Turner follows with Dr. [name]   Atrial fibrillation - Tamara Turner is in normal sinus rhythm this morning, rate control - Continue metoprolol and Imdur  Anemia - Hemoglobin and hematocrit stable and the Tamara Turner's baseline  Leucopenia - Chronic, stable and at Tamara Turner's baseline Hyponatremia - Chronic, stable and the Tamara Turner's baseline  Consultants:  Vascular surgery --> signed off, no acute intervention required at this point   Procedures/Studies: Dg Ankle Complete Right 06-11-12   1.  Lateral soft tissue swelling.  No acute fracture or focal osseous abnormality.  2.  Osteopenia.     Mr Ankle Right  Wo Contrast 2012-06-11   Skin ulceration over the lateral malleolus with marrow edema in the distal 2 cm of the fibula consistent osteomyelitis.   No abscess identified.    Antibiotics:  Clindamycin 2023-06-12 --> 12/03  Vancomycin 12/03 -->  Code Status: DNR Family Communication: Pt at bedside, niece at bedside  Disposition Plan: Back to SNF in 1-2 days  HPI/Subjective: No events overnight. Pt denies chest pain, foot pain this AM. No shortness of breath.  Objective: Filed Vitals:   2012/06/11 1314 2012-06-11 2200 05/20/12 0600 05/20/12 1307  BP: 148/51 140/62 117/50 128/64  Pulse: 80 71 70 54  Temp: 98 F (36.7 C) 97.9 F (36.6 C) 97.6 F (36.4 C) 98 F (36.7 C)  TempSrc: Oral Oral Oral Oral  Resp: 18 16 16 17   Height:    5' 8.11" (1.73 m)  Weight:  SpO2: 91% 100% 98% 93%    Intake/Output Summary (Last 24 hours) at 05/20/12 1434 Last data filed at 05/20/12 1344  Gross per 24 hour  Intake    250 ml  Output    950 ml  Net   -700 ml    Exam:   General:  Pt is alert, follows commands appropriately, not in acute distress  Cardiovascular: Regular rate and rhythm, S1/S2, no murmurs, no rubs, no gallops  Respiratory:  Clear to auscultation bilaterally, decreased breath sounds at bases   Abdomen: Soft, non tender, non distended, bowel sounds present, no guarding  Extremities: small ulceration at the lateral malleolus area, no drainage noted, edema and erythema improved, no ulceration on the left lower extremity   Neuro: Grossly nonfocal  Data Reviewed: Basic Metabolic Panel:  Lab 05/20/12 1610 05/19/12 0130  NA 129* 129*  K 4.6 4.8  CL 94* 93*  CO2 26 29  GLUCOSE 80 104*  BUN 18 27*  CREATININE 0.80 1.04  CALCIUM 9.3 9.8  MG -- --  PHOS -- --   CBC:  Lab 05/20/12 0528 05/19/12 0130  WBC 2.2* 2.2*  NEUTROABS -- 1.4*  HGB 8.1* 8.3*  HCT 23.5* 23.9*  MCV 130.6* 131.3*  PLT 290 304   Scheduled Meds:   . ALPRAZolam  0.25 mg Oral BID  . amLODipine  10 mg Oral Daily  . aspirin EC  81 mg Oral Daily  . cholecalciferol  5,000 Units Oral Daily  . cholestyramine light  4 g Oral BID  . digoxin  0.125 mg Oral Daily  . DULoxetine  60 mg Oral Daily  . enoxaparin injection  30 mg Subcutaneous Q24H  . gabapentin  300 mg Oral BID  . HYDROcodone-acetami  1 tablet Oral QID  . isosorbide mononitrate  60 mg Oral Daily  . lactobacillus  1 g Oral BID  . metoprolol tartrate  25 mg Oral Daily  . morphine  30 mg Oral BID  . multivitamin   1 tablet Oral Daily  . pantoprazole  40 mg Oral Daily  . polyethylene glycol  17 g Oral Q M,W,F  . vancomycin  750 mg Intravenous Q24H   Continuous Infusions:    Debbora Presto, MD  [facility] Pager [phone]  If 7PM-7AM, please contact night-coverage [email or web address] Password [email or web address] 05/20/2012, 2:34 PM   LOS: 2 days

## 2012-05-20 NOTE — Progress Notes (Signed)
INITIAL ADULT NUTRITION ASSESSMENT Date: 05/20/2012   Time: 2:47 PM Reason for Assessment: Nutrition risk   INTERVENTION: Resource Breeze TID. Recommend MD consider appetite stimulant r/t prolonged poor intake and discuss enteral nutrition with pt as an option if meal intake fails to improve. Performed limited nutrition focused physical exam. Will monitor.   Pt meets criteria for severe malnutrition of chronic illness AEB <75% estimated energy intake for several months with 19.2% weight loss in the past 6 months in addition to pt with moderate to severe muscle wasting and subcutaneous fat loss.   Nutrition Focused Physical Exam: Subcutaneous Fat:  Orbital Region: mild/moderate wasting Upper Arm Region: mild/moderate wasting Thoracic and Lumbar Region: mild/moderate wasting  Muscle:  Temple Region: mild/moderate wasting Clavicle Bone Region: severe wasting Clavicle and Acromion Bone Region: severe wasting Scapular Bone Region: mild/moderate wasting Dorsal Hand: mild/moderate wasting Patellar Region: did not examine Anterior Thigh Region: did not examine Posterior Calf Region: did not examine  Edema: None noted   ASSESSMENT: Female 78 y.o.  Dx: Cellulitis of foot  Food/Nutrition Related Hx: Pt from [facility]. Pt reports poor appetite for the past several months. Pt's niece present at bedside also giving history and reports pt eats small amounts at mealtimes. Pt reports she has been on Ensure in the past but it made her sick. Pt reports weighing 120 pounds 6 months ago, now weighs 97 pounds. Pt denies any problems chewing or swallowing. Pt with visible muscle and fat loss.   Hx:  Past Medical History  Diagnosis Date  . Vascular abnormality   . DVT (deep venous thrombosis)   . Alcoholism   . Depression   . Hypertension   . UTI (lower urinary tract infection)   . Chronic back pain   . Arthritis     all over  . Renal disorder   . Heart disease   .  Hyperlipidemia   . Phlebitis   . UTI (urinary tract infection)    Related Meds:  Scheduled Meds:   . ALPRAZolam  0.25 mg Oral BID  . alum & mag hydroxide-simeth  15 mL Oral TID  . amLODipine  10 mg Oral Daily  . aspirin EC  81 mg Oral Daily  . cholecalciferol  5,000 Units Oral Daily  . cholestyramine light  4 g Oral BID  . digoxin  0.125 mg Oral Daily  . DULoxetine  60 mg Oral Daily  . enoxaparin (LOVENOX) injection  30 mg Subcutaneous Q24H  . gabapentin  300 mg Oral BID  . HYDROcodone-acetaminophen  1 tablet Oral QID  . isosorbide mononitrate  60 mg Oral Daily  . lactobacillus  1 g Oral BID  . metoprolol tartrate  25 mg Oral Daily  . morphine  30 mg Oral BID  . multivitamin with minerals  1 tablet Oral Daily  . pantoprazole  40 mg Oral Daily  . polyethylene glycol  17 g Oral Q M,W,F  . sodium chloride  3 mL Intravenous Q12H  . vancomycin  750 mg Intravenous Q24H  . vancomycin  1,000 mg Intravenous Once  . [DISCONTINUED] clindamycin (CLEOCIN) IV  600 mg Intravenous Q8H  . [DISCONTINUED] enoxaparin (LOVENOX) injection  40 mg Subcutaneous Q24H   Continuous Infusions:  PRN Meds:.acetaminophen, acetaminophen, hydrOXYzine, morphine injection, ondansetron (ZOFRAN) IV, ondansetron  Ht: 5' 8.11" (173 cm) (last documented in October, 2013)  Wt: 97 lb 14.2 oz (44.4 kg)  Ideal Wt: 140 lb % Ideal Wt: 69  Usual Wt: 120 lb % Usual  Wt: 81  Body mass index is 14.84 kg/(m^2).  Labs:  CMP     Component Value Date/Time   NA 129* 05/20/2012 0528   K 4.6 05/20/2012 0528   CL 94* 05/20/2012 0528   CO2 26 05/20/2012 0528   GLUCOSE 80 05/20/2012 0528   BUN 18 05/20/2012 0528   CREATININE 0.80 05/20/2012 0528   CALCIUM 9.3 05/20/2012 0528   PROT 7.5 04/01/2012 1535   ALBUMIN 4.1 04/01/2012 1535   AST 30 04/01/2012 1535   ALT 12 04/01/2012 1535   ALKPHOS 58 04/01/2012 1535   BILITOT 1.2 04/01/2012 1535   GFRNONAA 64* 05/20/2012 0528   GFRAA 74* 05/20/2012 0528    Intake/Output Summary  (Last 24 hours) at 05/20/12 1452 Last data filed at 05/20/12 1344  Gross per 24 hour  Intake    250 ml  Output    950 ml  Net   -700 ml   Last BM - 12/1  Diet Order: Cardiac   IVF:    Estimated Nutritional Needs:   Kcal:1800-2000 Protein:90-100g Fluid:1.8-2L  NUTRITION DIAGNOSIS: -Inadequate oral intake (NI-2.1).  Status: Ongoing  RELATED TO: poor appetite  AS EVIDENCE BY: <50% meal intake, unintended weight loss  MONITORING/EVALUATION(Goals): Pt to consume >75% of meals/supplements.   EDUCATION NEEDS: -No education needs identified at this time  Dietitian #: [phone]  DOCUMENTATION CODES Per approved criteria  -Severe malnutrition in the context of chronic illness -Underweight    Marshall Cork 05/20/2012, 2:47 PM

## 2012-05-21 ENCOUNTER — Inpatient Hospital Stay (HOSPITAL_COMMUNITY): Payer: Medicare Other

## 2012-05-21 DIAGNOSIS — M86169 Other acute osteomyelitis, unspecified tibia and fibula: Secondary | ICD-10-CM

## 2012-05-21 DIAGNOSIS — I739 Peripheral vascular disease, unspecified: Secondary | ICD-10-CM

## 2012-05-21 HISTORY — DX: Other acute osteomyelitis, unspecified tibia and fibula: M86.169

## 2012-05-21 LAB — SEDIMENTATION RATE: Sed Rate: 42 mm/hr — ABNORMAL HIGH (ref 0–22)

## 2012-05-21 LAB — BASIC METABOLIC PANEL
BUN: 17 mg/dL (ref 6–23)
Calcium: 9.2 mg/dL (ref 8.4–10.5)
Creatinine, Ser: 0.83 mg/dL (ref 0.50–1.10)
GFR calc Af Amer: 71 mL/min — ABNORMAL LOW (ref >90)
GFR calc non Af Amer: 61 mL/min — ABNORMAL LOW (ref >90)
Glucose, Bld: 88 mg/dL (ref 70–99)

## 2012-05-21 LAB — CBC
HCT: 23.3 % — ABNORMAL LOW (ref 36.0–46.0)
Hemoglobin: 8.1 g/dL — ABNORMAL LOW (ref 12.0–15.0)
MCH: 45.5 pg — ABNORMAL HIGH (ref 26.0–34.0)
MCHC: 34.8 g/dL (ref 30.0–36.0)
MCV: 130.9 fL — ABNORMAL HIGH (ref 78.0–100.0)
RDW: 23 % — ABNORMAL HIGH (ref 11.5–15.5)

## 2012-05-21 LAB — C-REACTIVE PROTEIN: CRP: 3.3 mg/dL — ABNORMAL HIGH (ref <0.60)

## 2012-05-21 MED ORDER — SENNA 8.6 MG PO TABS
1.0000 | ORAL_TABLET | Freq: Every day | ORAL | Status: DC
  Administered 2012-05-21 – 2012-05-23 (×3): 8.6 mg via ORAL
  Filled 2012-05-21 (×3): qty 1

## 2012-05-21 MED ORDER — SODIUM CHLORIDE 0.9 % IJ SOLN
10.0000 mL | INTRAMUSCULAR | Status: DC | PRN
  Administered 2012-05-23 (×2): 10 mL

## 2012-05-21 MED ORDER — DOCUSATE SODIUM 100 MG PO CAPS
100.0000 mg | ORAL_CAPSULE | Freq: Two times a day (BID) | ORAL | Status: DC
  Administered 2012-05-21 – 2012-05-23 (×5): 100 mg via ORAL
  Filled 2012-05-21 (×6): qty 1

## 2012-05-21 NOTE — Progress Notes (Signed)
As per IV team, chest xray completed, Picc line in the right upper arm is okay to use.  Will monitor.

## 2012-05-21 NOTE — Progress Notes (Signed)
[facility] PROGRESS NOTE  Tamara Turner ZOX:[id number] DOB: 23-Apr-1922 DOA: 05/18/2012 PCP: Florentina Jenny, MD  Assessment/Plan: Osteomyelitis of the right distal fibula  - slight clinical improvement noted--please see MRI findings of the right ankle below  - Continue vancomycin D#2 -Will plan 28 days of IV vancomycin depending on response of CRP/ESR -Treatment is empiric, no previous cultures done--surface/nonsurgical wound cultures will be of little help/significance at this point given scant to no drainage of right leg wound. - Continue supportive care for now with analgesia as needed and keep the extremity elevated  - Also continue local wound care  - Appreciate vascular surgery recommendations, please note no acute intervention is required at this point  - Please note that ABI. indicated right lower extremity 0.42, left lower extremity 0.56  - After discussion with patient's knees, patient was told in the past that amputation may be needed in the future  -place PICC line -Check ESR/CRP -PT evaluation and treat -If wound is not healed with treatment of infection, she will likely need vascular intervention. As she has documented severe right lower extremity PVD Active Problems:  Ischemic heart disease  - This appears to be stable clinical condition at this point  - Patient follows with Dr. [name]  Atrial fibrillation  - Patient is in normal sinus rhythm this morning, rate control  - Continue metoprolol and Imdur  Anemia--macrocytic - Hemoglobin and hematocrit stable and the patient's baseline -B12, folate -Check fecal occult blood  Leucopenia  - Chronic, stable and at patient's baseline  -Check B12, folate Hyponatremia  - Chronic, stable and the patient's baseline -Judicious normal saline trial--patient appears on the hypovolemic side  Consultants:  Vascular surgery --> signed off, no acute intervention required at this point  Procedures/Studies:  Dg Ankle Complete  Right  05/19/2012  1. Lateral soft tissue swelling. No acute fracture or focal osseous abnormality.  2. Osteopenia.  Mr Ankle Right Wo Contrast  05/19/2012  Skin ulceration over the lateral malleolus with marrow edema in the distal 2 cm of the fibula consistent osteomyelitis.  No abscess identified.  Antibiotics:  Clindamycin 12/02 --> 12/03  Vancomycin 12/03 -->     Family Communication:   Pt at beside Disposition Plan:   SNF when medically stable        Procedures/Studies: Dg Ankle Complete Right  05/19/2012  *RADIOLOGY REPORT*  Clinical Data: Diffuse ankle pain and swelling.  Small wound overlying the lateral malleolus.  RIGHT ANKLE - COMPLETE 3+ VIEW  Comparison: None.  Findings: Mild lateral soft tissue swelling is seen.  No evidence of subcutaneous emphysema or radiopaque foreign body.  Generalized osteopenia is noted.  No evidence of acute fracture or dislocation.  No evidence of osteolysis or periosteal reaction.  No other focal bone lesion identified.  IMPRESSION:  1.  Lateral soft tissue swelling.  No acute fracture or focal osseous abnormality. 2.  Osteopenia.   Original Report Authenticated By: Myles Rosenthal, M.D.    Mr Ankle Right  Wo Contrast  05/19/2012  *RADIOLOGY REPORT*  Clinical Data: Ulceration of the lateral malleolus.  MRI RIGHT ANKLE WITHOUT CONTRAST  Technique:  Multiplanar, multisequence MR imaging of the right ankle was performed.  No intravenous contrast was administered.  Comparison:  Plain films right ankle 05/19/2012 and 1:10 a.m.  Findings:  Skin ulceration is seen over the lateral malleolus with underlying marrow edema and corresponding decreased T1 signal. Changes involve the distal 2 cm of the lateral malleolus and are consistent with osteomyelitis.  Mild  subcutaneous edema is seen about the lateral aspect of the ankle.  No abscess is identified.  The peroneal, Achilles, posteromedial and anterior tendons all appear normal.  Major ligamentous structures about  the ankle appear intact.  No osteochondral lesion of the talar dome is identified. There is no fracture.  IMPRESSION: Skin ulceration over the lateral malleolus with marrow edema in the distal 2 cm of the fibula consistent osteomyelitis.  No abscess identified.   Original Report Authenticated By: Holley Dexter, M.D.    Mr Abd W/wo Cm/mrcp  04/24/2012  *RADIOLOGY REPORT*  Clinical Data:  Abdominal pain.  Dilated common bile duct.  MRI ABDOMEN WITHOUT AND WITH CONTRAST (MRCP)  Technique:  Multiplanar multisequence MR imaging of the abdomen was performed without and with contrast, including heavily T2-weighted images of the biliary and pancreatic ducts.  Three-dimensional MR images were rendered by post processing of the original MR data.  Contrast: 9mL MULTIHANCE GADOBENATE DIMEGLUMINE 529 MG/ML IV SOLN  Comparison:  CT scan 03/28/2012.  Findings:  The left hepatic lobe appears somewhat atrophied.  There are dilated and markedly beaded and irregular left hepatic bile ducts.  This is likely the sequela of prior cholangitis.  There is mild biliary dilatation in the right hepatic lobe the ducts are smooth and regular.  There is diffuse dilatation of the common bile duct which measures up to 13 mm.  No common bile duct stones are identified.  No MR findings for pancreatic head mass or ampullary tumor.  The gallbladder is slightly dilated and demonstrates septations.  The cystic duct is grossly normal.  The pancreatic duct is normal in caliber for age has a normal course.  A small cyst is noted in the right hepatic lobe.  No worrisome hepatic lesions.  No MR findings to suggest a cholangiocarcinoma. The spleen is enlarged.  The pancreas is normal except for mild atrophy.  Both kidneys demonstrate numerous large and small cyst. No worrisome renal lesions.  No mesenteric or retroperitoneal mass or adenopathy.  There are atherosclerotic changes involving the aorta but no focal aneurysm.  A left renal artery stent is  noted.  IMPRESSION:  1.  Left hepatic lobe atrophy and dilated tortuous irregular beaded bile ducts likely the sequela of prior inflammation/infection/cholangitis. 2.  Mild dilatation of the right intrahepatic biliary tree but no inflammatory changes. 3.  Common bile duct dilatation but no common bile duct stones or evidence of pancreatic head mass or ampullary lesion.  This could be normal for age or could be due to a distal stricture. Correlation with liver function studies is recommended. 4.  Numerous bilateral renal cysts.   Original Report Authenticated By: Rudie Meyer, M.D.          Subjective: Patient denies fevers, chills, chest pain, shortness breath, vomiting, diarrhea, abdominal pain. She complains of right lower extremity. Relieved opioids. There is some constipation,, but small bowel movement today. No abdominal pain or vomiting.  Objective: Filed Vitals:   05/20/12 1720 05/20/12 2200 05/21/12 0500 05/21/12 1043  BP: 102/58 115/61 133/70 128/63  Pulse: 51 62 65 77  Temp: 97.5 F (36.4 C) 98.1 F (36.7 C) 98.4 F (36.9 C)   TempSrc: Axillary Oral Oral   Resp: 16 16 16    Height:      Weight:      SpO2: 96% 94% 96% 95%    Intake/Output Summary (Last 24 hours) at 05/21/12 1119 Last data filed at 05/20/12 1350  Gross per 24 hour  Intake  250 ml  Output      0 ml  Net    250 ml   Weight change:  Exam:   General:  Pt is alert, follows commands appropriately, not in acute distress  HEENT: No icterus, No thrush,  [place]/AT  Cardiovascular: Irregular. No rubs.  Respiratory: CTA bilaterally, no wheezing, no crackles, no rhonchi  Abdomen: Soft/+BS, non tender, non distended, no guarding  Extremities: No edema, No lymphangitis, No petechiae, No rashes, no synovitis--right lateral malleolus area ulceration--scant yellow drainage without any lymphangitis, necrosis, crepitance. Posterior tibial and dorsalis pedis pulse palpable on the right.  Data Reviewed: Basic  Metabolic Panel:  Lab 05/21/12 9528 05/20/12 0528 05/19/12 0130  NA 127* 129* 129*  K 4.6 4.6 4.8  CL 93* 94* 93*  CO2 25 26 29   GLUCOSE 88 80 104*  BUN 17 18 27*  CREATININE 0.83 0.80 1.04  CALCIUM 9.2 9.3 9.8  MG -- -- --  PHOS -- -- --   Liver Function Tests: No results found for this basename: AST:5,ALT:5,ALKPHOS:5,BILITOT:5,PROT:5,ALBUMIN:5 in the last 168 hours No results found for this basename: LIPASE:5,AMYLASE:5 in the last 168 hours No results found for this basename: AMMONIA:5 in the last 168 hours CBC:  Lab 05/21/12 0527 05/20/12 0528 05/19/12 0130  WBC 2.2* 2.2* 2.2*  NEUTROABS -- -- 1.4*  HGB 8.1* 8.1* 8.3*  HCT 23.3* 23.5* 23.9*  MCV 130.9* 130.6* 131.3*  PLT 266 290 304   Cardiac Enzymes: No results found for this basename: CKTOTAL:5,CKMB:5,CKMBINDEX:5,TROPONINI:5 in the last 168 hours BNP: No components found with this basename: POCBNP:5 CBG: No results found for this basename: GLUCAP:5 in the last 168 hours  No results found for this or any previous visit (from the past 240 hour(s)).   Scheduled Meds:   . ALPRAZolam  0.25 mg Oral BID  . alum & mag hydroxide-simeth  15 mL Oral TID  . amLODipine  10 mg Oral Daily  . aspirin EC  81 mg Oral Daily  . cholecalciferol  5,000 Units Oral Daily  . cholestyramine light  4 g Oral BID  . digoxin  0.125 mg Oral Daily  . docusate sodium  100 mg Oral BID  . DULoxetine  60 mg Oral Daily  . enoxaparin (LOVENOX) injection  30 mg Subcutaneous Q24H  . feeding supplement  1 Container Oral TID BM  . gabapentin  300 mg Oral BID  . HYDROcodone-acetaminophen  1 tablet Oral QID  . isosorbide mononitrate  60 mg Oral Daily  . lactobacillus  1 g Oral BID  . metoprolol tartrate  25 mg Oral Daily  . morphine  30 mg Oral BID  . multivitamin with minerals  1 tablet Oral Daily  . pantoprazole  40 mg Oral Daily  . polyethylene glycol  17 g Oral Q M,W,F  . senna  1 tablet Oral Daily  . sodium chloride  3 mL Intravenous Q12H   . vancomycin  750 mg Intravenous Q24H  . [COMPLETED] vancomycin  1,000 mg Intravenous Once  . [DISCONTINUED] clindamycin (CLEOCIN) IV  600 mg Intravenous Q8H  . [DISCONTINUED] enoxaparin (LOVENOX) injection  40 mg Subcutaneous Q24H   Continuous Infusions:    Krystiana Fornes, DO  [facility] Pager [phone]  If 7PM-7AM, please contact night-coverage [email or web address] Password [email or web address] 05/21/2012, 11:19 AM   LOS: 3 days

## 2012-05-21 NOTE — Evaluation (Signed)
Physical Therapy Evaluation Patient Details Name: Tamara Turner MRN: [id number] DOB: April 25, 1922 Today's Date: 05/21/2012 Time: 3086-5784 PT Time Calculation (min): 18 min  PT Assessment / Plan / Recommendation Clinical Impression  78 yo female admitted with osteomyelitis of R distal fibula, R foot ulcer, impaired circulation in bil LEs. Prior to admission, pt was living in Independent Living???([facility]). On eval, pt requires Min-Mod assist for bed mobility and stand pivot to recliner. Activity limited by significant pain in bil LEs, R > L. Recommend ST rehab at SNF unless facility is able to provide current level of care.      PT Assessment  Patient needs continued PT services    Follow Up Recommendations  SNF    Does the patient have the potential to tolerate intense rehabilitation      Barriers to Discharge        Equipment Recommendations  None recommended by PT    Recommendations for Other Services OT consult   Frequency Min 3X/week    Precautions / Restrictions Precautions Precautions: Fall Precaution Comments: R lower leg very tender to touch. Also pt has wound on R ankle. Restrictions Weight Bearing Restrictions: No   Pertinent Vitals/Pain R/L leg-unrated. Increased pain with activity      Mobility  Bed Mobility Bed Mobility: Supine to Sit Supine to Sit: 4: Min assist Details for Bed Mobility Assistance: Assist for trunk to upright. Increased time. Pt attempted to use momentum technique to sit up but was unable to sit up without assist.  Transfers Transfers: Sit to Stand;Stand to Sit;Stand pivot Sit to Stand: From elevated surface;From bed;3: Mod assist Stand to Sit: 4: Min assist;To chair/3-in-1;With upper extremity assist;With armrests Details for Transfer Assistance: x2 attempts to stand. VCs safety, technqiue, hand placement. Assist to rise, stabilize, control descent, maneuver with RW. Increased time. Limited activity  tolerance due to painful feet (R >L) Ambulation/Gait Assistive device: Rolling walker Ambulation/Gait Assistance Details: 3-4 pivotal steps to recliner with RW.     Shoulder Instructions     Exercises     PT Diagnosis: Difficulty walking;Abnormality of gait;Acute pain  PT Problem List: Decreased activity tolerance;Decreased mobility;Pain;Decreased knowledge of use of DME PT Treatment Interventions: DME instruction;Gait training;Functional mobility training;Therapeutic activities;Therapeutic exercise;Patient/family education   PT Goals Acute Rehab PT Goals PT Goal Formulation: With patient/family Time For Goal Achievement: 06/04/12 Potential to Achieve Goals: Good Pt will go Supine/Side to Sit: with supervision PT Goal: Supine/Side to Sit - Progress: Goal set today Pt will go Sit to Supine/Side: with supervision PT Goal: Sit to Supine/Side - Progress: Goal set today Pt will go Sit to Stand: with supervision PT Goal: Sit to Stand - Progress: Goal set today Pt will Transfer Bed to Chair/Chair to Bed: with supervision PT Transfer Goal: Bed to Chair/Chair to Bed - Progress: Goal set today Pt will Ambulate: 1 - 15 feet;with min assist;with rolling walker PT Goal: Ambulate - Progress: Goal set today  Visit Information  Last PT Received On: 05/21/12 Assistance Needed: +1    Subjective Data  Subjective: "Oooohhh....don't touch my leg!" Patient Stated Goal: Get better. Less pain   Prior Functioning  Home Living Type of Home: Independent living facility ([facility]) Home Access: Level entry Home Layout: One level Home Adaptive Equipment: Wheelchair - manual;Walker - rolling;Bedside commode/3-in-1 Prior Function Level of Independence: Independent with assistive device(s) Comments: Pt states she use wheelchair primarily for mobility-pivot transfers. Last ambulated ~4 months ago.  Communication Communication: No difficulties  Cognition  Overall  Cognitive Status: Appears within functional limits for tasks assessed/performed Arousal/Alertness: Awake/alert Orientation Level: Appears intact for tasks assessed Behavior During Session: [facility] for tasks performed    Extremity/Trunk Assessment Right Lower Extremity Assessment RLE ROM/Strength/Tone: Due to pain;Unable to fully assess;Deficits Left Lower Extremity Assessment LLE ROM/Strength/Tone: Deficits LLE ROM/Strength/Tone Deficits: Strength at least 3+/5 with functional activity   Balance    End of Session PT - End of Session Activity Tolerance: Patient tolerated treatment well Patient left: in chair;with call bell/phone within reach;with family/visitor present  GP     Rebeca Alert [facility] 05/21/2012, 4:02 PM [phone]

## 2012-05-21 NOTE — Progress Notes (Signed)
Peripherally Inserted Central Catheter/Midline Placement  The IV Nurse has discussed with the patient and/or persons authorized to consent for the patient, the purpose of this procedure and the potential benefits and risks involved with this procedure.  The benefits include less needle sticks, lab draws from the catheter and patient may be discharged home with the catheter.  Risks include, but not limited to, infection, bleeding, blood clot (thrombus formation), and puncture of an artery; nerve damage and irregular heat beat.  Alternatives to this procedure were also discussed.  PICC/Midline Placement Documentation        Vevelyn Pat 05/21/2012, 1:54 PM

## 2012-05-21 NOTE — Clinical Social Work Psychosocial (Unsigned)
     Clinical Social Work Department BRIEF PSYCHOSOCIAL ASSESSMENT 05/21/2012  Patient:  Tamara Turner, Tamara Turner     Account Number:  [id number]     Admit date:  05/18/2012  Clinical Social Worker:  Hattie Perch  Date/Time:  05/21/2012 12:00 M  Referred by:  Physician  Date Referred:  05/21/2012 Referred for  SNF Placement   Other Referral:   Interview type:  Patient Other interview type:   neice    PSYCHOSOCIAL DATA Living Status:  FACILITY Admitted from facility:  [facility] Level of care:  Assisted Living Primary support name:   Primary support relationship to patient:   Degree of support available:   good    CURRENT CONCERNS Current Concerns  Post-Acute Placement   Other Concerns:    SOCIAL WORK ASSESSMENT / PLAN CSW met with patient. patient will need IV antibiotics once a day and it is unknown if her facility will be able to accommodate that. Patient is agreeable to snf if this is the case.   Assessment/plan status:   Other assessment/ plan:   Information/referral to community resources:    PATIENTS/FAMILYS RESPONSE TO PLAN OF CARE: patient agreeable to being faxed out in case snf is needed.

## 2012-05-22 LAB — CBC WITH DIFFERENTIAL/PLATELET
Basophils Relative: 0 % (ref 0–1)
Eosinophils Absolute: 0 10*3/uL (ref 0.0–0.7)
HCT: 20.3 % — ABNORMAL LOW (ref 36.0–46.0)
Hemoglobin: 7.1 g/dL — ABNORMAL LOW (ref 12.0–15.0)
Lymphocytes Relative: 20 % (ref 12–46)
MCHC: 35 g/dL (ref 30.0–36.0)
Monocytes Relative: 15 % — ABNORMAL HIGH (ref 3–12)
Neutrophils Relative %: 65 % (ref 43–77)
RBC: 1.57 MIL/uL — ABNORMAL LOW (ref 3.87–5.11)
WBC: 2.4 10*3/uL — ABNORMAL LOW (ref 4.0–10.5)

## 2012-05-22 LAB — BASIC METABOLIC PANEL
Chloride: 94 mEq/L — ABNORMAL LOW (ref 96–112)
GFR calc Af Amer: 86 mL/min — ABNORMAL LOW (ref >90)
GFR calc non Af Amer: 74 mL/min — ABNORMAL LOW (ref >90)
Glucose, Bld: 93 mg/dL (ref 70–99)
Potassium: 4.3 mEq/L (ref 3.5–5.1)
Sodium: 128 mEq/L — ABNORMAL LOW (ref 135–145)

## 2012-05-22 LAB — PREPARE RBC (CROSSMATCH)

## 2012-05-22 MED ORDER — FLEET ENEMA 7-19 GM/118ML RE ENEM
1.0000 | ENEMA | Freq: Once | RECTAL | Status: AC
  Administered 2012-05-22: 1 via RECTAL
  Filled 2012-05-22: qty 1

## 2012-05-22 NOTE — Progress Notes (Signed)
While checking for occult blood pt noted to have hard balls of stool in rectum removed one.  Dr. Sharl Ma informed and order noted for fleets enema.  Enema given and large amt of formed brown stool noted.  Pt tol well.

## 2012-05-22 NOTE — Progress Notes (Addendum)
Ulcer to right outer ankle 1.5cm x 2cm yellow with red around (peri wound) 3cm x 3cm. Area cleaned with ns and alginate applied covered with foam dressing. Former foam dressing had mod amt of yellow drainage.

## 2012-05-22 NOTE — Progress Notes (Signed)
Medical release form faxed to Dr Allene Pyo office, in [place].  Release form placed in pt chart.

## 2012-05-22 NOTE — Progress Notes (Addendum)
[facility] PROGRESS NOTE  Haleigh Desmith AVW:[id number] DOB: [birth year] DOA: 05/18/2012 PCP: Florentina Jenny, MD  Assessment/Plan: Osteomyelitis of the right distal fibula  - slight clinical improvement noted--please see MRI findings of the right ankle below  - Continue vancomycin D#3 -Will plan 28 days of IV vancomycin depending on response of CRP/ESR  Continue supportive care for now with analgesia as needed and keep the extremity elevated  - Also continue local wound care  - Appreciate vascular surgery recommendations, please note no acute intervention is required at this point  - Please note that ABI. indicated right lower extremity 0.42, left lower extremity 0.56  - After discussion with patient's knees, patient was told in the past that amputation may be needed in the future  -place PICC line -PT evaluation and treat -If wound is not healed with treatment of infection, she will likely need vascular intervention. As she has documented severe right lower extremity PVD  Active Problems:  Ischemic heart disease  - This appears to be stable clinical condition at this point  - Patient follows with Dr. [name]   Atrial fibrillation  - Patient is in normal sinus rhythm this morning, rate control  - Continue metoprolol  Anemia--macrocytic - hemoglobin is low to 7.1 -Transfuse two units PRBC.  Leucopenia  - Chronic, stable and at patient's baseline  -Will obtain records from hematology at [facility].  Hyponatremia  - Chronic, stable and the patient's baseline -Judicious normal saline trial--patient appears on the hypovolemic side   Consultants:  Vascular surgery --> signed off, no acute intervention required at this point  Procedures/Studies:  Dg Ankle Complete Right  05/19/2012  1. Lateral soft tissue swelling. No acute fracture or focal osseous abnormality.  2. Osteopenia.  Mr Ankle Right Wo Contrast  05/19/2012  Skin ulceration over the lateral malleolus with marrow  edema in the distal 2 cm of the fibula consistent osteomyelitis.  No abscess identified.  Antibiotics:  Clindamycin 12/02 --> 12/03  Vancomycin 12/03 -->    Family Communication:   Pt at beside Disposition Plan:   SNF when medically stable        Procedures/Studies: Dg Ankle Complete Right  05/19/2012  *RADIOLOGY REPORT*  Clinical Data: Diffuse ankle pain and swelling.  Small wound overlying the lateral malleolus.  RIGHT ANKLE - COMPLETE 3+ VIEW  Comparison: None.  Findings: Mild lateral soft tissue swelling is seen.  No evidence of subcutaneous emphysema or radiopaque foreign body.  Generalized osteopenia is noted.  No evidence of acute fracture or dislocation.  No evidence of osteolysis or periosteal reaction.  No other focal bone lesion identified.   IMPRESSION:  1.  Lateral soft tissue swelling.  No acute fracture or focal osseous abnormality. 2.  Osteopenia.   Original Report Authenticated By: Myles Rosenthal, M.D.    Mr Ankle Right  Wo Contrast  05/19/2012  *RADIOLOGY REPORT*  Clinical Data: Ulceration of the lateral malleolus.  MRI RIGHT ANKLE WITHOUT CONTRAST  Technique:  Multiplanar, multisequence MR imaging of the right ankle was performed.  No intravenous contrast was administered.  Comparison:  Plain films right ankle 05/19/2012 and 1:10 a.m.  Findings:  Skin ulceration is seen over the lateral malleolus with underlying marrow edema and corresponding decreased T1 signal. Changes involve the distal 2 cm of the lateral malleolus and are consistent with osteomyelitis.  Mild subcutaneous edema is seen about the lateral aspect of the ankle.  No abscess is identified.  The peroneal, Achilles, posteromedial and anterior tendons all appear normal.  Major ligamentous structures about the ankle appear intact.  No osteochondral lesion of the talar dome is identified. There is no fracture.   IMPRESSION: Skin ulceration over the lateral malleolus with marrow edema in the distal 2 cm of the fibula  consistent osteomyelitis.  No abscess identified.   Original Report Authenticated By: Holley Dexter, M.D.    Mr Abd W/wo Cm/mrcp  04/24/2012  *RADIOLOGY REPORT*  Clinical Data:  Abdominal pain.  Dilated common bile duct.  MRI ABDOMEN WITHOUT AND WITH CONTRAST (MRCP)  Technique:  Multiplanar multisequence MR imaging of the abdomen was performed without and with contrast, including heavily T2-weighted images of the biliary and pancreatic ducts.  Three-dimensional MR images were rendered by post processing of the original MR data.  Contrast: 9mL MULTIHANCE GADOBENATE DIMEGLUMINE 529 MG/ML IV SOLN  Comparison:  CT scan 03/28/2012.  Findings:  The left hepatic lobe appears somewhat atrophied.  There are dilated and markedly beaded and irregular left hepatic bile ducts.  This is likely the sequela of prior cholangitis.  There is mild biliary dilatation in the right hepatic lobe the ducts are smooth and regular.  There is diffuse dilatation of the common bile duct which measures up to 13 mm.  No common bile duct stones are identified.  No MR findings for pancreatic head mass or ampullary tumor.  The gallbladder is slightly dilated and demonstrates septations.  The cystic duct is grossly normal.  The pancreatic duct is normal in caliber for age has a normal course.  A small cyst is noted in the right hepatic lobe.  No worrisome hepatic lesions.  No MR findings to suggest a cholangiocarcinoma. The spleen is enlarged.  The pancreas is normal except for mild atrophy.  Both kidneys demonstrate numerous large and small cyst. No worrisome renal lesions.  No mesenteric or retroperitoneal mass or adenopathy.  There are atherosclerotic changes involving the aorta but no focal aneurysm.  A left renal artery stent is noted.   IMPRESSION:  1.  Left hepatic lobe atrophy and dilated tortuous irregular beaded bile ducts likely the sequela of prior inflammation/infection/cholangitis. 2.  Mild dilatation of the right intrahepatic  biliary tree but no inflammatory changes. 3.  Common bile duct dilatation but no common bile duct stones or evidence of pancreatic head mass or ampullary lesion.  This could be normal for age or could be due to a distal stricture. Correlation with liver function studies is recommended. 4.  Numerous bilateral renal cysts.   Original Report Authenticated By: Rudie Meyer, M.D.          Subjective: Patient denies fevers, chills, chest pain, shortness breath, vomiting, diarrhea, abdominal pain. She complains of right lower extremity pain.  Objective: Filed Vitals:   05/21/12 1419 05/21/12 2053 05/22/12 0604 05/22/12 1440  BP: 110/65 121/65 147/56 115/68  Pulse: 83 56 91 67  Temp: 98.1 F (36.7 C) 97.5 F (36.4 C) 97.3 F (36.3 C) 97.8 F (36.6 C)  TempSrc: Oral Oral Oral Oral  Resp: 16 16 16 18   Height:      Weight:      SpO2: 96% 97% 95% 97%    Intake/Output Summary (Last 24 hours) at 05/22/12 1743 Last data filed at 05/22/12 1425  Gross per 24 hour  Intake    270 ml  Output    250 ml  Net     20 ml   Weight change:  Exam:   General:  Pt is alert, follows commands appropriately, not in acute distress  HEENT: No icterus, No thrush,  [place]/AT  Cardiovascular: Irregular. No rubs.  Respiratory: CTA bilaterally, no wheezing, no crackles, no rhonchi  Abdomen: Soft/+BS, non tender, non distended, no guarding  Extremities: No edema, No lymphangitis, No petechiae, No rashes, no synovitis--right lateral malleolus area ulceration--scant yellow drainage without any lymphangitis, necrosis, crepitance. Posterior tibial and dorsalis pedis pulse palpable on the right.  Data Reviewed: Basic Metabolic Panel:  Lab 05/22/12 1610 05/21/12 0527 05/20/12 0528 05/19/12 0130  NA 128* 127* 129* 129*  K 4.3 4.6 4.6 4.8  CL 94* 93* 94* 93*  CO2 27 25 26 29   GLUCOSE 93 88 80 104*  BUN 17 17 18  27*  CREATININE 0.73 0.83 0.80 1.04  CALCIUM 9.1 9.2 9.3 9.8  MG -- -- -- --  PHOS -- -- -- --    Liver Function Tests: No results found for this basename: AST:5,ALT:5,ALKPHOS:5,BILITOT:5,PROT:5,ALBUMIN:5 in the last 168 hours No results found for this basename: LIPASE:5,AMYLASE:5 in the last 168 hours No results found for this basename: AMMONIA:5 in the last 168 hours CBC:  Lab 05/22/12 0405 05/21/12 0527 05/20/12 0528 05/19/12 0130  WBC 2.4* 2.2* 2.2* 2.2*  NEUTROABS 1.5* -- -- 1.4*  HGB 7.1* 8.1* 8.1* 8.3*  HCT 20.3* 23.3* 23.5* 23.9*  MCV 129.3* 130.9* 130.6* 131.3*  PLT 274 266 290 304   Cardiac Enzymes: No results found for this basename: CKTOTAL:5,CKMB:5,CKMBINDEX:5,TROPONINI:5 in the last 168 hours BNP: No components found with this basename: POCBNP:5 CBG: No results found for this basename: GLUCAP:5 in the last 168 hours  No results found for this or any previous visit (from the past 240 hour(s)).   Scheduled Meds:    . ALPRAZolam  0.25 mg Oral BID  . alum & mag hydroxide-simeth  15 mL Oral TID  . amLODipine  10 mg Oral Daily  . aspirin EC  81 mg Oral Daily  . cholecalciferol  5,000 Units Oral Daily  . cholestyramine light  4 g Oral BID  . digoxin  0.125 mg Oral Daily  . docusate sodium  100 mg Oral BID  . DULoxetine  60 mg Oral Daily  . enoxaparin (LOVENOX) injection  30 mg Subcutaneous Q24H  . feeding supplement  1 Container Oral TID BM  . gabapentin  300 mg Oral BID  . HYDROcodone-acetaminophen  1 tablet Oral QID  . isosorbide mononitrate  60 mg Oral Daily  . lactobacillus  1 g Oral BID  . metoprolol tartrate  25 mg Oral Daily  . morphine  30 mg Oral BID  . multivitamin with minerals  1 tablet Oral Daily  . pantoprazole  40 mg Oral Daily  . polyethylene glycol  17 g Oral Q M,W,F  . senna  1 tablet Oral Daily  . sodium chloride  3 mL Intravenous Q12H  . [COMPLETED] sodium phosphate  1 enema Rectal Once  . vancomycin  750 mg Intravenous Q24H   Continuous Infusions:    Meredeth Ide, MD [facility] Pager [phone]  If 7PM-7AM, please  contact night-coverage [email or web address] Password [email or web address] 05/22/2012, 5:43 PM   LOS: 4 days

## 2012-05-23 LAB — CBC
HCT: 31.1 % — ABNORMAL LOW (ref 36.0–46.0)
Hemoglobin: 11 g/dL — ABNORMAL LOW (ref 12.0–15.0)
MCH: 38.1 pg — ABNORMAL HIGH (ref 26.0–34.0)
MCV: 107.6 fL — ABNORMAL HIGH (ref 78.0–100.0)
RBC: 2.89 MIL/uL — ABNORMAL LOW (ref 3.87–5.11)

## 2012-05-23 LAB — BASIC METABOLIC PANEL
BUN: 14 mg/dL (ref 6–23)
CO2: 28 mEq/L (ref 19–32)
Calcium: 9.4 mg/dL (ref 8.4–10.5)
Creatinine, Ser: 0.65 mg/dL (ref 0.50–1.10)
Glucose, Bld: 89 mg/dL (ref 70–99)
Sodium: 130 mEq/L — ABNORMAL LOW (ref 135–145)

## 2012-05-23 LAB — TYPE AND SCREEN
ABO/RH(D): O POS
Antibody Screen: NEGATIVE
Unit division: 0

## 2012-05-23 LAB — URINALYSIS, ROUTINE W REFLEX MICROSCOPIC
Glucose, UA: NEGATIVE mg/dL
Ketones, ur: NEGATIVE mg/dL
Leukocytes, UA: NEGATIVE
pH: 7.5 (ref 5.0–8.0)

## 2012-05-23 MED ORDER — DULOXETINE HCL 60 MG PO CPEP: 60.0000 mg | ORAL_CAPSULE | Freq: Every day | ORAL | Status: DC

## 2012-05-23 MED ORDER — HEPARIN SOD (PORK) LOCK FLUSH 100 UNIT/ML IV SOLN
250.0000 [IU] | Freq: Every day | INTRAVENOUS | Status: DC
  Filled 2012-05-23: qty 3

## 2012-05-23 MED ORDER — ALPRAZOLAM 0.25 MG PO TABS
0.2500 mg | ORAL_TABLET | Freq: Three times a day (TID) | ORAL | Status: DC
Start: 2012-05-23 — End: 2012-05-23
  Administered 2012-05-23 (×2): 0.25 mg via ORAL
  Filled 2012-05-23 (×2): qty 1

## 2012-05-23 MED ORDER — MORPHINE SULFATE ER 15 MG PO TBCR: 45.0000 mg | EXTENDED_RELEASE_TABLET | Freq: Two times a day (BID) | ORAL | Status: DC

## 2012-05-23 MED ORDER — ACETAMINOPHEN 325 MG PO TABS
650.0000 mg | ORAL_TABLET | Freq: Four times a day (QID) | ORAL | Status: DC
  Administered 2012-05-23 (×2): 650 mg via ORAL

## 2012-05-23 MED ORDER — HYDROCODONE-ACETAMINOPHEN 5-325 MG PO TABS: 1.0000 | ORAL_TABLET | ORAL | Status: DC | PRN

## 2012-05-23 MED ORDER — VANCOMYCIN HCL 1000 MG IV SOLR: 750.0000 mg | INTRAVENOUS | Status: AC

## 2012-05-23 MED ORDER — DSS 100 MG PO CAPS: 100.0000 mg | ORAL_CAPSULE | Freq: Two times a day (BID) | ORAL | Status: DC

## 2012-05-23 MED ORDER — MORPHINE SULFATE ER 30 MG PO TBCR
45.0000 mg | EXTENDED_RELEASE_TABLET | Freq: Two times a day (BID) | ORAL | Status: DC
  Administered 2012-05-23: 45 mg via ORAL
  Filled 2012-05-23: qty 1

## 2012-05-23 MED ORDER — HEPARIN SOD (PORK) LOCK FLUSH 100 UNIT/ML IV SOLN
250.0000 [IU] | INTRAVENOUS | Status: DC | PRN
  Administered 2012-05-23: 250 [IU]
  Filled 2012-05-23: qty 3

## 2012-05-23 NOTE — Progress Notes (Signed)
ANTIBIOTIC CONSULT NOTE - FOLLOW UP  Pharmacy Consult for Vancomycin Indication: osteomyelitis  Allergies  Allergen Reactions  . Penicillins Hives  . Sulfa Antibiotics Nausea Only    Patient Measurements: Height: 5' 8.11" (173 cm) (last documented in October, 2013) Weight: 97 lb 14.2 oz (44.4 kg) IBW/kg (Calculated) : 64.15   Vital Signs: Temp: 97.7 F (36.5 C) (12/06 1356) Temp src: Oral (12/06 1356) BP: 151/41 mmHg (12/06 1356) Pulse Rate: 59  (12/06 1356) Intake/Output from previous day: 12/05 0701 - 12/06 0700 In: 1975 [P.O.:220; I.V.:150; Blood:675; IV Piggyback:150] Out: -  Intake/Output from this shift: Total I/O In: -  Out: 300 [Urine:300]  Labs:  [facility] 05/23/12 0810 05/22/12 0405 05/21/12 0527  WBC 4.8 2.4* 2.2*  HGB 11.0* 7.1* 8.1*  PLT 255 274 266  LABCREA -- -- --  CREATININE 0.65 0.73 0.83   Estimated Creatinine Clearance: 34.1 ml/min (by C-G formula based on Cr of 0.65).  Basename 05/23/12 1310  VANCOTROUGH 11.2  VANCOPEAK --  VANCORANDOM --  GENTTROUGH --  GENTPEAK --  GENTRANDOM --  TOBRATROUGH --  TOBRAPEAK --  TOBRARND --  AMIKACINPEAK --  AMIKACINTROU --  AMIKACIN --     Microbiology: No results found for this or any previous visit (from the past 720 hour(s)).  Anti-infectives     Start     Dose/Rate Route Frequency Ordered Stop   05/23/12 0000   sodium chloride 0.9 % SOLN 150 mL with vancomycin 1000 MG SOLR 750 mg        750 mg 150 mL/hr over 60 Minutes Intravenous Every 24 hours 05/23/12 1156 06/13/12 2359   05/21/12 1400   vancomycin (VANCOCIN) 750 mg in sodium chloride 0.9 % 150 mL IVPB        750 mg 150 mL/hr over 60 Minutes Intravenous Every 24 hours 05/20/12 1336     05/20/12 1345   vancomycin (VANCOCIN) IVPB 1000 mg/200 mL premix        1,000 mg 200 mL/hr over 60 Minutes Intravenous  Once 05/20/12 1336 05/20/12 1450   05/19/12 0815   clindamycin (CLEOCIN) IVPB 600 mg  Status:  Discontinued        600 mg 100  mL/hr over 30 Minutes Intravenous 3 times per day 05/19/12 0808 05/20/12 1317   05/19/12 0030   clindamycin (CLEOCIN) IVPB 900 mg        900 mg 100 mL/hr over 30 Minutes Intravenous  Once 05/19/12 0021 05/19/12 0235          Assessment:  89 YOF w/ osteomyelitis on Day #4 Vancomycin 750mg  IV q24h (1g dose in ER on day #1)  Scr wnl, stable; CrCl 41ml/min/1.73m2 (Cockroft-Gault formula ~ 34 ml/min)  WBC wnl  No Cx  Wt 44kg  Vanc trough this afternoon prior to 4th dose is 11.2 which is subtherapeutic; however, given advanced age and small habitus would expect accumulation and therapeutic trough with continued doses    Goal of Therapy:  Vancomycin trough level 15-20 mcg/ml  Plan:  Continue Vancomycin 750mg  IV q24h F/U Scr and Vanc trough at least twice weekly as outpatient  Gwen Her PharmD  [phone] 05/23/2012 2:03 PM

## 2012-05-23 NOTE — Progress Notes (Signed)
Patient to be discharged to [facility] nursing facility.  Patient is agreeable to the transfer.  All belongings with the patient.  Patient medicated for pain prior to be discharged.  IV team at the bedside to cap and heparinize Right upper arm PICC line.  Report given to [facility] RN at [employer].  No further questions at this time.  Called ambulance to come pick patient up.  Will continue to monitor.

## 2012-05-23 NOTE — Discharge Summary (Addendum)
Physician Discharge Summary  Tamara Turner WJX:[id number] DOB: [birth year] DOA: 05/18/2012  PCP: Florentina Jenny, MD  Admit date: 05/18/2012 Discharge date: 05/23/2012  Time spent: 55 minutes  Recommendations for Outpatient Follow-up:  Check CBC in two weeks and then after a month and fax the results to Dr Maren Beach in [place] at fax number: [phone]   Discharge Diagnoses:  Principal Problem:  *Cellulitis of foot Active Problems:  Ischemic heart disease  Atrial fibrillation  Anemia  Leucopenia  Acute osteomyelitis, lower leg  Peripheral vascular disease   Discharge Condition: Stable  Diet recommendation: Low salt diet  Filed Weights   05/19/12 0823  Weight: 44.4 kg (97 lb 14.2 oz)    History of present illness:  78 year-old female who had just recently moved from [place] to [place] to be closer to her niece presents to the ER with complaints of worsening pain in the right foot over the last 24 hours. Patient had developed a small ulcer on the lateral aspect of her right foot over the last one month. Patient states also may have developed when she had injured that area on the wheelchair. Since then it has slowly become and also which is nonhealing. Lasted for hours that area has become very painful with swelling. Denies any fever chills. In the ER x-ray shows soft tissue swelling. In addition patient also has been found to have new anemia and leukopenia when compared to her labs this August that is 5 months ago   Hospital Course:  Osteomyelitis of the right distal fibula  patient came with right foot ulcer, she was started on IV clindamycin which was later changed to IV vancomycin after the MRI findings suggesting of  Osteomyelitis. Vascular surgery was consulted, and at this time no surgery was offered the plan was to treat the ulcer with local wound care as well as vancomycin for 4 weeks. If patient does not improve with medical management she would then require  surgery  - Please note that ABI. indicated right lower extremity 0.42, left lower extremity 0.56  PICC line has been placed for the IV antibiotics . As she has documented severe right lower extremity PVD   Active Problems:  Ischemic heart disease  - This appears to be stable clinical condition at this point  - Patient follows with Dr. [name]   Atrial fibrillation  - Patient is in normal sinus rhythm this morning, rate control  - Continue metoprolol   Anemia-- Patient's hemoglobin dropped to 7.1 requiring blood transfusion. Patient follows Dr. Maren Beach 111 and [facility] in [place].I called and discussed with Dr. Ezzard Standing, patient takes Hydrea for thrombocytosis which can cause bone marrow suppression. At this time he recommends to stop Hydrea and obtain CBC in 2 weeks and at 4 weeks and fax the results. Today her hemoglobin is 11.0  Leucopenia   Most likely secondary to Hydrea.WBCs improved to 2.89 today.  Hyponatremia  - Chronic, stable and the patient's baseline   Hypertension Patient will continue to take amlodipine, metoprolol and spironolactone   Procedures:  None  Consultations:   vascular surgery with Dr. Dorothey Baseman  Discharge Exam: Filed Vitals:   05/23/12 0235 05/23/12 0600 05/23/12 0639 05/23/12 0951  BP: 145/60 173/67 148/66 165/75  Pulse: 61 74    Temp: 97.9 F (36.6 C) 98 F (36.7 C)    TempSrc: Oral Oral    Resp: 18 18    Height:      Weight:  SpO2:  94%      General:  appearing no acute distress Cardiovascular: S1-S2 regular  Respiratory:  clear to auscultation bilaterally  Extremities : No edema  Discharge Instructions  Discharge Orders    Future Appointments: Provider: Department: Dept Phone: Center:   06/24/2012 2:00 PM Vesta Mixer, MD [facility] Main Office [place]) [phone] [facility]   07/04/2012 2:00 PM Vesta Mixer, MD [facility] Main Office [place])  [phone] [facility]     Future Orders Please Complete By Expires   Diet - low sodium heart healthy      Increase activity slowly      Discharge instructions      Scheduling Instructions:   Check CBC in two weeks and then after a month and fax the results to Dr Maren Beach in [place] at fax number: [phone]   Comments:   Local wound care on the right foot       Medication List     As of 05/23/2012 12:29 PM    STOP taking these medications         hydroxyurea 500 MG capsule   Commonly known as: HYDREA      TAKE these medications         ALPRAZolam 0.25 MG tablet   Commonly known as: XANAX   Take 0.25 mg by mouth 2 (two) times daily.      amLODipine 10 MG tablet   Commonly known as: NORVASC   Take 10 mg by mouth daily.      aspirin 81 MG tablet   Take 81 mg by mouth daily.      Calcium Carb-Vit D-C-E-Mineral 600 MG Tabs   Take 1 tablet by mouth daily.      cholestyramine light 4 G packet   Commonly known as: PREVALITE   Take 4 g by mouth 2 (two) times daily.      digoxin 0.125 MG tablet   Commonly known as: LANOXIN   Take 0.125 mg by mouth daily.      DSS 100 MG Caps   Take 100 mg by mouth 2 (two) times daily.      DULoxetine 60 MG capsule   Commonly known as: CYMBALTA   Take 1 capsule (60 mg total) by mouth daily.      gabapentin 300 MG capsule   Commonly known as: NEURONTIN   Take 300 mg by mouth 2 (two) times daily.      HYDROcodone-acetaminophen 5-325 MG per tablet   Commonly known as: NORCO/VICODIN   Take 1 tablet by mouth every 4 (four) hours as needed for pain. Pain      hydrOXYzine 25 MG tablet   Commonly known as: ATARAX/VISTARIL   Take 25 mg by mouth every 8 (eight) hours as needed. Tension      isosorbide mononitrate 60 MG 24 hr tablet   Commonly known as: IMDUR   Take 60 mg by mouth daily.      lactobacillus Pack   Take 1 g by mouth 2 (two) times daily.      loperamide 2 MG capsule   Commonly known as: IMODIUM   Take 4  mg by mouth as needed. Take 2 caps by mouth after first loose stool then 1 cap by mouth after each subsequent      metoprolol tartrate 25 MG tablet   Commonly known as: LOPRESSOR   Take 25 mg by mouth daily.      MI-ACID 200-200-20 MG/5ML suspension   Generic  drug: alum & mag hydroxide-simeth   Take 15 mLs by mouth 3 (three) times daily.      morphine 15 MG 12 hr tablet   Commonly known as: MS CONTIN   Take 3 tablets (45 mg total) by mouth 2 (two) times daily.      MULTIVITAL tablet   Take 1 tablet by mouth daily.      pantoprazole 40 MG tablet   Commonly known as: PROTONIX   Take 40 mg by mouth daily.      polyethylene glycol packet   Commonly known as: MIRALAX / GLYCOLAX   Take 17 g by mouth every Monday, Wednesday, and Friday.      sodium chloride 0.9 % SOLN 150 mL with vancomycin 1000 MG SOLR 750 mg   Inject 750 mg into the vein daily.      spironolactone 25 MG tablet   Commonly known as: ALDACTONE   Take 25 mg by mouth daily.      Vitamin D-3 5000 UNITS Tabs   Take 1 capsule by mouth daily.          The results of significant diagnostics from this hospitalization (including imaging, microbiology, ancillary and laboratory) are listed below for reference.    Significant Diagnostic Studies: Dg Ankle Complete Right  05/19/2012  *RADIOLOGY REPORT*  Clinical Data: Diffuse ankle pain and swelling.  Small wound overlying the lateral malleolus.  RIGHT ANKLE - COMPLETE 3+ VIEW  Comparison: None.  Findings: Mild lateral soft tissue swelling is seen.  No evidence of subcutaneous emphysema or radiopaque foreign body.  Generalized osteopenia is noted.  No evidence of acute fracture or dislocation.  No evidence of osteolysis or periosteal reaction.  No other focal bone lesion identified.  IMPRESSION:  1.  Lateral soft tissue swelling.  No acute fracture or focal osseous abnormality. 2.  Osteopenia.   Original Report Authenticated By: Myles Rosenthal, M.D.    Mr Ankle Right  Wo  Contrast  05/19/2012  *RADIOLOGY REPORT*  Clinical Data: Ulceration of the lateral malleolus.  MRI RIGHT ANKLE WITHOUT CONTRAST  Technique:  Multiplanar, multisequence MR imaging of the right ankle was performed.  No intravenous contrast was administered.  Comparison:  Plain films right ankle 05/19/2012 and 1:10 a.m.  Findings:  Skin ulceration is seen over the lateral malleolus with underlying marrow edema and corresponding decreased T1 signal. Changes involve the distal 2 cm of the lateral malleolus and are consistent with osteomyelitis.  Mild subcutaneous edema is seen about the lateral aspect of the ankle.  No abscess is identified.  The peroneal, Achilles, posteromedial and anterior tendons all appear normal.  Major ligamentous structures about the ankle appear intact.  No osteochondral lesion of the talar dome is identified. There is no fracture.  IMPRESSION: Skin ulceration over the lateral malleolus with marrow edema in the distal 2 cm of the fibula consistent osteomyelitis.  No abscess identified.   Original Report Authenticated By: Holley Dexter, M.D.    Dg Chest Port 1 View  05/21/2012  *RADIOLOGY REPORT*  Clinical Data: Line placement  PORTABLE CHEST - 1 VIEW  Comparison: Portable exam 1442 hours without priors for comparison  Findings: Right arm PICC line, tip projecting over SVC just below carina. Enlargement of cardiac silhouette. Minimal elongation of thoracic aorta with atherosclerotic calcification at arch. Mediastinal contours and pulmonary vascularity otherwise normal. Lungs appear emphysematous but clear. Skin folds at lower chest bilaterally. No pleural effusion or pneumothorax. Bilateral advanced glenohumeral degenerative changes greater on left.  IMPRESSION: Tip of right arm PICC line projects over SVC. Emphysematous changes without acute infiltrate.   Original Report Authenticated By: Ulyses Southward, M.D.    Mr Abd W/wo Cm/mrcp  04/24/2012  *RADIOLOGY REPORT*  Clinical Data:  Abdominal  pain.  Dilated common bile duct.  MRI ABDOMEN WITHOUT AND WITH CONTRAST (MRCP)  Technique:  Multiplanar multisequence MR imaging of the abdomen was performed without and with contrast, including heavily T2-weighted images of the biliary and pancreatic ducts.  Three-dimensional MR images were rendered by post processing of the original MR data.  Contrast: 9mL MULTIHANCE GADOBENATE DIMEGLUMINE 529 MG/ML IV SOLN  Comparison:  CT scan 03/28/2012.  Findings:  The left hepatic lobe appears somewhat atrophied.  There are dilated and markedly beaded and irregular left hepatic bile ducts.  This is likely the sequela of prior cholangitis.  There is mild biliary dilatation in the right hepatic lobe the ducts are smooth and regular.  There is diffuse dilatation of the common bile duct which measures up to 13 mm.  No common bile duct stones are identified.  No MR findings for pancreatic head mass or ampullary tumor.  The gallbladder is slightly dilated and demonstrates septations.  The cystic duct is grossly normal.  The pancreatic duct is normal in caliber for age has a normal course.  A small cyst is noted in the right hepatic lobe.  No worrisome hepatic lesions.  No MR findings to suggest a cholangiocarcinoma. The spleen is enlarged.  The pancreas is normal except for mild atrophy.  Both kidneys demonstrate numerous large and small cyst. No worrisome renal lesions.  No mesenteric or retroperitoneal mass or adenopathy.  There are atherosclerotic changes involving the aorta but no focal aneurysm.  A left renal artery stent is noted.  IMPRESSION:  1.  Left hepatic lobe atrophy and dilated tortuous irregular beaded bile ducts likely the sequela of prior inflammation/infection/cholangitis. 2.  Mild dilatation of the right intrahepatic biliary tree but no inflammatory changes. 3.  Common bile duct dilatation but no common bile duct stones or evidence of pancreatic head mass or ampullary lesion.  This could be normal for age or  could be due to a distal stricture. Correlation with liver function studies is recommended. 4.  Numerous bilateral renal cysts.   Original Report Authenticated By: Rudie Meyer, M.D.     Microbiology: No results found for this or any previous visit (from the past 240 hour(s)).   Labs: Basic Metabolic Panel:  Lab 05/23/12 5784 05/22/12 0405 05/21/12 0527 05/20/12 0528 05/19/12 0130  NA 130* 128* 127* 129* 129*  K 4.2 4.3 4.6 4.6 4.8  CL 95* 94* 93* 94* 93*  CO2 28 27 25 26 29   GLUCOSE 89 93 88 80 104*  BUN 14 17 17 18  27*  CREATININE 0.65 0.73 0.83 0.80 1.04  CALCIUM 9.4 9.1 9.2 9.3 9.8  MG -- -- -- -- --  PHOS -- -- -- -- --   Liver Function Tests: No results found for this basename: AST:5,ALT:5,ALKPHOS:5,BILITOT:5,PROT:5,ALBUMIN:5 in the last 168 hours No results found for this basename: LIPASE:5,AMYLASE:5 in the last 168 hours No results found for this basename: AMMONIA:5 in the last 168 hours CBC:  Lab 05/23/12 0810 05/22/12 0405 05/21/12 0527 05/20/12 0528 05/19/12 0130  WBC 4.8 2.4* 2.2* 2.2* 2.2*  NEUTROABS -- 1.5* -- -- 1.4*  HGB 11.0* 7.1* 8.1* 8.1* 8.3*  HCT 31.1* 20.3* 23.3* 23.5* 23.9*  MCV 107.6* 129.3* 130.9* 130.6* 131.3*  PLT 255 274 266  290 304   Cardiac Enzymes: No results found for this basename: CKTOTAL:5,CKMB:5,CKMBINDEX:5,TROPONINI:5 in the last 168 hours BNP: BNP (last 3 results) No results found for this basename: PROBNP:3 in the last 8760 hours CBG: No results found for this basename: GLUCAP:5 in the last 168 hours     Signed:  Mikayla Chiusano S  [facility] 05/23/2012, 12:29 PM

## 2012-05-23 NOTE — Progress Notes (Signed)
Dressing changed to right outer ankle.  Old dressing with small amount of yellow drainage, no odor, wound very painful to touch.  New dressing applied, alginate applied with foam dressing.  Will continue to monitor.

## 2012-05-23 NOTE — Progress Notes (Signed)
Patient discharged to [facility] nursing facility.  Patient picked up by ambulance at this time.

## 2012-06-24 ENCOUNTER — Ambulatory Visit: Payer: Medicare Other | Admitting: Cardiovascular Disease

## 2012-07-04 ENCOUNTER — Ambulatory Visit: Payer: Medicare Other | Admitting: Cardiovascular Disease

## 2012-08-16 DIAGNOSIS — K810 Acute cholecystitis: Secondary | ICD-10-CM

## 2012-08-16 HISTORY — DX: Acute cholecystitis: K81.0

## 2012-08-19 ENCOUNTER — Inpatient Hospital Stay (HOSPITAL_COMMUNITY)
Admission: EM | Admit: 2012-08-19 | Discharge: 2012-08-28 | DRG: 392 | Disposition: A | Discharge status: Skilled Nursing Facility | Payer: Medicare Other | Attending: Internal Medicine | Admitting: Internal Medicine

## 2012-08-19 ENCOUNTER — Encounter (HOSPITAL_COMMUNITY): Payer: Self-pay

## 2012-08-19 ENCOUNTER — Emergency Department (HOSPITAL_COMMUNITY): Payer: Medicare Other

## 2012-08-19 DIAGNOSIS — M549 Dorsalgia, unspecified: Secondary | ICD-10-CM | POA: Diagnosis present

## 2012-08-19 DIAGNOSIS — E860 Dehydration: Secondary | ICD-10-CM | POA: Diagnosis present

## 2012-08-19 DIAGNOSIS — Z66 Do not resuscitate: Secondary | ICD-10-CM | POA: Diagnosis present

## 2012-08-19 DIAGNOSIS — L03119 Cellulitis of unspecified part of limb: Secondary | ICD-10-CM

## 2012-08-19 DIAGNOSIS — I259 Chronic ischemic heart disease, unspecified: Secondary | ICD-10-CM

## 2012-08-19 DIAGNOSIS — Z882 Allergy status to sulfonamides status: Secondary | ICD-10-CM

## 2012-08-19 DIAGNOSIS — R932 Abnormal findings on diagnostic imaging of liver and biliary tract: Secondary | ICD-10-CM

## 2012-08-19 DIAGNOSIS — I739 Peripheral vascular disease, unspecified: Secondary | ICD-10-CM | POA: Diagnosis present

## 2012-08-19 DIAGNOSIS — D6489 Other specified anemias: Secondary | ICD-10-CM | POA: Diagnosis present

## 2012-08-19 DIAGNOSIS — Z681 Body mass index (BMI) 19 or less, adult: Secondary | ICD-10-CM

## 2012-08-19 DIAGNOSIS — M86169 Other acute osteomyelitis, unspecified tibia and fibula: Secondary | ICD-10-CM

## 2012-08-19 DIAGNOSIS — F17200 Nicotine dependence, unspecified, uncomplicated: Secondary | ICD-10-CM | POA: Diagnosis present

## 2012-08-19 DIAGNOSIS — D473 Essential (hemorrhagic) thrombocythemia: Secondary | ICD-10-CM | POA: Diagnosis present

## 2012-08-19 DIAGNOSIS — R636 Underweight: Secondary | ICD-10-CM | POA: Diagnosis present

## 2012-08-19 DIAGNOSIS — F411 Generalized anxiety disorder: Secondary | ICD-10-CM | POA: Diagnosis present

## 2012-08-19 DIAGNOSIS — D72819 Decreased white blood cell count, unspecified: Secondary | ICD-10-CM

## 2012-08-19 DIAGNOSIS — I10 Essential (primary) hypertension: Secondary | ICD-10-CM | POA: Diagnosis present

## 2012-08-19 DIAGNOSIS — K838 Other specified diseases of biliary tract: Secondary | ICD-10-CM | POA: Diagnosis present

## 2012-08-19 DIAGNOSIS — E785 Hyperlipidemia, unspecified: Secondary | ICD-10-CM | POA: Diagnosis present

## 2012-08-19 DIAGNOSIS — Z7982 Long term (current) use of aspirin: Secondary | ICD-10-CM

## 2012-08-19 DIAGNOSIS — F3289 Other specified depressive episodes: Secondary | ICD-10-CM | POA: Diagnosis present

## 2012-08-19 DIAGNOSIS — I2510 Atherosclerotic heart disease of native coronary artery without angina pectoris: Secondary | ICD-10-CM | POA: Diagnosis present

## 2012-08-19 DIAGNOSIS — R5381 Other malaise: Secondary | ICD-10-CM | POA: Diagnosis present

## 2012-08-19 DIAGNOSIS — I498 Other specified cardiac arrhythmias: Secondary | ICD-10-CM | POA: Diagnosis present

## 2012-08-19 DIAGNOSIS — D649 Anemia, unspecified: Secondary | ICD-10-CM

## 2012-08-19 DIAGNOSIS — Z86718 Personal history of other venous thrombosis and embolism: Secondary | ICD-10-CM

## 2012-08-19 DIAGNOSIS — R197 Diarrhea, unspecified: Secondary | ICD-10-CM

## 2012-08-19 DIAGNOSIS — Z79899 Other long term (current) drug therapy: Secondary | ICD-10-CM

## 2012-08-19 DIAGNOSIS — Z880 Allergy status to penicillin: Secondary | ICD-10-CM

## 2012-08-19 DIAGNOSIS — Z9861 Coronary angioplasty status: Secondary | ICD-10-CM

## 2012-08-19 DIAGNOSIS — K810 Acute cholecystitis: Secondary | ICD-10-CM | POA: Diagnosis present

## 2012-08-19 DIAGNOSIS — A088 Other specified intestinal infections: Principal | ICD-10-CM | POA: Diagnosis present

## 2012-08-19 DIAGNOSIS — I4891 Unspecified atrial fibrillation: Secondary | ICD-10-CM | POA: Diagnosis present

## 2012-08-19 DIAGNOSIS — F329 Major depressive disorder, single episode, unspecified: Secondary | ICD-10-CM | POA: Diagnosis present

## 2012-08-19 DIAGNOSIS — G8929 Other chronic pain: Secondary | ICD-10-CM | POA: Diagnosis present

## 2012-08-19 DIAGNOSIS — R109 Unspecified abdominal pain: Secondary | ICD-10-CM

## 2012-08-19 DIAGNOSIS — R634 Abnormal weight loss: Secondary | ICD-10-CM | POA: Diagnosis present

## 2012-08-19 HISTORY — DX: Atherosclerotic heart disease of native coronary artery without angina pectoris: I25.10

## 2012-08-19 LAB — POCT I-STAT, CHEM 8
BUN: 25 mg/dL — ABNORMAL HIGH (ref 6–23)
Chloride: 97 mEq/L (ref 96–112)
HCT: 41 % (ref 36.0–46.0)
Sodium: 140 mEq/L (ref 135–145)
TCO2: 35 mmol/L (ref 0–100)

## 2012-08-19 LAB — COMPREHENSIVE METABOLIC PANEL
ALT: 7 U/L (ref 0–35)
AST: 26 U/L (ref 0–37)
Calcium: 9.8 mg/dL (ref 8.4–10.5)
Creatinine, Ser: 0.79 mg/dL (ref 0.50–1.10)
Sodium: 136 mEq/L (ref 135–145)
Total Protein: 7.3 g/dL (ref 6.0–8.3)

## 2012-08-19 LAB — CBC
MCH: 31.4 pg (ref 26.0–34.0)
MCH: 32 pg (ref 26.0–34.0)
MCHC: 32.1 g/dL (ref 30.0–36.0)
MCV: 99.7 fL (ref 78.0–100.0)
Platelets: 1892 10*3/uL (ref 150–400)
Platelets: 1797 10*3/uL (ref 150–400)
RBC: 3.56 MIL/uL — ABNORMAL LOW (ref 3.87–5.11)
RDW: 15.3 % (ref 11.5–15.5)
RDW: 15.1 % (ref 11.5–15.5)
WBC: 11.9 10*3/uL — ABNORMAL HIGH (ref 4.0–10.5)

## 2012-08-19 LAB — LIPASE, BLOOD: Lipase: 23 U/L (ref 11–59)

## 2012-08-19 LAB — TROPONIN I: Troponin I: <0.3 ng/mL (ref <0.30)

## 2012-08-19 LAB — GLUCOSE, CAPILLARY: Glucose-Capillary: 81 mg/dL (ref 70–99)

## 2012-08-19 MED ORDER — IOHEXOL 300 MG/ML  SOLN
50.0000 mL | Freq: Once | INTRAMUSCULAR | Status: AC | PRN
  Administered 2012-08-19: 50 mL via ORAL

## 2012-08-19 MED ORDER — SODIUM CHLORIDE 0.9 % IV SOLN
1000.0000 mL | Freq: Once | INTRAVENOUS | Status: AC
  Administered 2012-08-19: 1000 mL via INTRAVENOUS

## 2012-08-19 MED ORDER — SODIUM CHLORIDE 0.9 % IV SOLN
1000.0000 mL | INTRAVENOUS | Status: DC
  Administered 2012-08-19: 1000 mL via INTRAVENOUS

## 2012-08-19 MED ORDER — IOHEXOL 300 MG/ML  SOLN
80.0000 mL | Freq: Once | INTRAMUSCULAR | Status: AC | PRN
  Administered 2012-08-19: 80 mL via INTRAVENOUS

## 2012-08-19 NOTE — ED Notes (Signed)
Pt. having watery diarrhea began thru the night.  Paramedics reports that they give her a laxative/frequent constipation and then gets diarrhea and then is given anti diarrhea.  She also had a cbg of 57 she received  25 grams of D10. And she is having bradycardia 50.   Pt. She denies  Any pain , feels tired.   Alert and oriented X4.   Skin is w/d/pale.

## 2012-08-19 NOTE — ED Provider Notes (Signed)
History     CSN: [id number]  Arrival date & time 08/19/12  1644   First MD Initiated Contact with Patient 08/19/12 1645      Chief Complaint  Patient presents with  . Bradycardia     The history is provided by the patient and the EMS personnel.   patient was sent to the emergency department for persistent diarrhea over the past 12-24 hours.  No nausea or vomiting.  The patient denies fevers or chills.  No melena or hematochezia.  The patient is currently on digoxin for history of atrial fibrillation.  Per EMS she was having multiple episodes of PVCs and was bradycardic at times.  The patient denies chest pain or shortness of breath.  Her symptoms are mild to moderate in severity.  Nothing worsens or improves her symptoms.  Past Medical History  Diagnosis Date  . Vascular abnormality   . DVT (deep venous thrombosis)   . Alcoholism   . Depression   . Hypertension   . UTI (lower urinary tract infection)   . Chronic back pain   . Arthritis     all over  . Renal disorder   . Heart disease   . Hyperlipidemia   . Phlebitis   . UTI (urinary tract infection)     Past Surgical History  Procedure Laterality Date  . Thrombectomy    . Shoulder surgery    . Hand surgery    . Appendectomy    . Coronary angioplasty with stent placement    . Spine surgery      x 3    Family History  Problem Relation Age of Onset  . Colon cancer Neg Hx   . Pancreatic cancer Sister     History  Substance Use Topics  . Smoking status: Current Every Day Smoker -- 0.25 packs/day    Types: Cigarettes  . Smokeless tobacco: Never Used  . Alcohol Use: No    OB History   Grav Para Term Preterm Abortions TAB SAB Ect Mult Living                  Review of Systems  All other systems reviewed and are negative.    Allergies  Penicillins and Sulfa antibiotics  Home Medications   Current Outpatient Rx  Name  Route  Sig  Dispense  Refill  . ALPRAZolam (XANAX) 0.25 MG tablet   Oral   Take  0.25 mg by mouth 2 (two) times daily.         Marland Kitchen alum & mag hydroxide-simeth (MI-ACID) 200-200-20 MG/5ML suspension   Oral   Take 15 mLs by mouth 3 (three) times daily.         Marland Kitchen amLODipine (NORVASC) 10 MG tablet   Oral   Take 10 mg by mouth daily with breakfast.          . aspirin 81 MG chewable tablet   Oral   Chew 81 mg by mouth daily with breakfast.         . calcium-vitamin D (OSCAL WITH D) 500-200 MG-UNIT per tablet   Oral   Take 1 tablet by mouth daily with breakfast.         . cholestyramine light (PREVALITE) 4 G packet   Oral   Take 8 g by mouth daily before breakfast.          . digoxin (LANOXIN) 0.125 MG tablet   Oral   Take 0.125 mg by mouth daily with breakfast.          .  DULoxetine (CYMBALTA) 60 MG capsule   Oral   Take 1 capsule (60 mg total) by mouth daily.   30 capsule   0   . feeding supplement (ENSURE IMMUNE HEALTH) LIQD   Oral   Take 237 mLs by mouth 2 (two) times daily before lunch and supper.         . gabapentin (NEURONTIN) 300 MG capsule   Oral   Take 300 mg by mouth 2 (two) times daily.          Marland Kitchen HYDROcodone-acetaminophen (NORCO/VICODIN) 5-325 MG per tablet   Oral   Take 1 tablet by mouth every 4 (four) hours as needed for pain. Pain   30 tablet   0   . isosorbide mononitrate (IMDUR) 60 MG 24 hr tablet   Oral   Take 60 mg by mouth daily with breakfast.          . lactobacillus (FLORANEX/LACTINEX) PACK   Oral   Take 1 g by mouth 2 (two) times daily.         . lansoprazole (PREVACID) 30 MG capsule   Oral   Take 30 mg by mouth daily with breakfast.         . loperamide (IMODIUM) 2 MG capsule   Oral   Take 2-4 mg by mouth as needed. Take 2 caps by mouth after first loose stool then 1 cap by mouth after each subsequent         . memantine (NAMENDA) 5 MG tablet   Oral   Take 5 mg by mouth 2 (two) times daily.         . metoprolol tartrate (LOPRESSOR) 25 MG tablet   Oral   Take 25 mg by mouth daily with  breakfast.          . mirtazapine (REMERON) 15 MG tablet   Oral   Take 7.5 mg by mouth at bedtime.         Marland Kitchen morphine (MS CONTIN) 15 MG 12 hr tablet   Oral   Take 45 mg by mouth 3 (three) times daily.         . Multiple Vitamins-Minerals (PRESERVISION AREDS PO)   Oral   Take 1 capsule by mouth 2 (two) times daily.         . polyethylene glycol (MIRALAX / GLYCOLAX) packet   Oral   Take 17 g by mouth daily.          . silver sulfADIAZINE (SILVADENE) 1 % cream   Topical   Apply 1 application topically every other day. With wound cleaning procedure         . spironolactone (ALDACTONE) 25 MG tablet   Oral   Take 25 mg by mouth daily with breakfast.            BP 158/51  Pulse 54  Temp(Src) 97.6 F (36.4 C) (Oral)  Resp 12  SpO2 99%  Physical Exam  Nursing note and vitals reviewed. Constitutional: She is oriented to person, place, and time. She appears well-developed and well-nourished. No distress.  HENT:  Head: Normocephalic and atraumatic.  Eyes: EOM are normal.  Neck: Normal range of motion.  Cardiovascular: Normal rate, regular rhythm and normal heart sounds.   Pulmonary/Chest: Effort normal and breath sounds normal.  Abdominal: Soft. She exhibits no distension. There is no tenderness.  Musculoskeletal: Normal range of motion.  Neurological: She is alert and oriented to person, place, and time.  Skin: Skin is warm and dry.  Psychiatric: She has a normal mood and affect. Judgment normal.    ED Course  Procedures (including critical care time)  Labs Reviewed  CBC - Abnormal; Notable for the following:    WBC 12.0 (*)    Platelets 1892 (*)    All other components within normal limits  COMPREHENSIVE METABOLIC PANEL - Abnormal; Notable for the following:    Glucose, Bld 104 (*)    BUN 24 (*)    Alkaline Phosphatase 133 (*)    GFR calc non Af Amer 72 (*)    GFR calc Af Amer 84 (*)    All other components within normal limits  CBC - Abnormal;  Notable for the following:    WBC 11.9 (*)    RBC 3.56 (*)    Hemoglobin 11.4 (*)    HCT 35.5 (*)    Platelets 1797 (*)    All other components within normal limits  POCT I-STAT, CHEM 8 - Abnormal; Notable for the following:    BUN 25 (*)    Glucose, Bld 107 (*)    All other components within normal limits  CLOSTRIDIUM DIFFICILE BY PCR  STOOL CULTURE  DIGOXIN LEVEL  TROPONIN I  GLUCOSE, CAPILLARY  LIPASE, BLOOD  PATHOLOGIST SMEAR REVIEW  JAK2 GENOTYPR   US Abdomen Complete  08/20/2012  *RADIOLOGY REPORT*  Clinical Data:  Right upper quadrant abdominal pain.  ABDOMINAL ULTRASOUND COMPLETE  Comparison:  CT of the abdomen and pelvis performed earlier today at 09:16 p.m.  Findings:  Gallbladder: The gallbladder is diffusely thick-walled, with the wall measuring up to 0.7 cm in thickness.  No pericholecystic fluid is seen.  This is nonspecific, and could reflect a variety of conditions, such as hypoalbuminemia or congestive right heart failure.  No stones are identified; no ultrasonographic Murphy's sign is elicited.  Common Bile Duct:  1.3 cm in diameter; diffusely dilated, of uncertain significance.  Liver: Diffusely increased hepatic echogenicity and coarsened echotexture, compatible with fatty infiltration.  Intrahepatic biliary ductal dilatation at the left hepatic lobe is not well characterized on ultrasound.  A 1.2 cm cyst is noted within the inferior aspect of the right hepatic lobe.  Limited Doppler evaluation demonstrates normal blood flow within the liver.  IVC:  Unremarkable in appearance.  Pancreas:  Although the pancreas is difficult to visualize in its entirety due to overlying bowel gas, no focal pancreatic abnormality is identified.  Spleen:  14.4 cm in length; increased in size, with increased echogenicity.  Right kidney:  9.8 cm in length; grossly normal in size and configuration.  Diffusely increased parenchymal echogenicity is noted.  No evidence of hydronephrosis.  Multiple large  right-sided renal cysts are seen, measuring up to 9.0 cm in size.  Left kidney:  11.1 cm in length; grossly normal in size and configuration.  Diffusely increased parenchymal echogenicity is noted.  No evidence of hydronephrosis. Multiple large left-sided renal cysts are seen, measuring up to 5.9 cm in size.  Abdominal Aorta:  Normal in caliber; no aneurysm identified. Scattered calcific atherosclerotic disease is noted along the visualized abdominal aorta.  IMPRESSION:  1.  Diffusely thick-walled gallbladder, with the wall measuring up to 0.7 cm in thickness.  No pericholecystic fluid seen; no stones identified.  No ultrasonographic Murphy's sign elicited.  Findings are nonspecific, and could reflect a variety of conditions, such as hypoalbuminemia or congestive right heart failure. 2.  Diffuse dilatation of the common hepatic duct, of uncertain significance.  Intrahepatic biliary ductal dilatation at the left hepatic  lobe is not well characterized on ultrasound.  Findings on CT raise concern for distal obstruction, not well assessed on this study.  As suggested on CT, MRCP could be considered for further evaluation, when and as deemed clinically appropriate (when and if the patient can tolerate holding her breath for the study). 3.  Diffuse fatty infiltration within the liver.  Small hepatic cyst seen. 4.  Splenomegaly again noted. 5.  Large bilateral renal cysts seen. 6.  Diffusely increased renal parenchymal echogenicity noted bilaterally, raising question for medical renal disease. 7.  Scattered calcific atherosclerotic disease noted along the visualized abdominal aorta.   Original Report Authenticated By: Tonia Ghent, M.D.    Ct Abdomen Pelvis W Contrast  08/19/2012  *RADIOLOGY REPORT*  Clinical Data: Diarrhea.  CT ABDOMEN AND PELVIS WITH CONTRAST  Technique:  Multidetector CT imaging of the abdomen and pelvis was performed following the standard protocol during bolus administration of intravenous  contrast.  Contrast: 80mL OMNIPAQUE IOHEXOL 300 MG/ML  SOLN  Comparison: CT of the abdomen and pelvis performed 03/28/2012  Findings: Increased interstitial prominence at the lung bases could reflect mild interstitial edema.  Note is again made of marked prominence of the biliary ducts within the left hepatic lobe, with marked distension of the common hepatic duct, measuring 1.8 cm in diameter.  This again raises concern for an underlying obstructive process, such as cholangiocarcinoma; as before, MRCP could be considered for further evaluation, when and as deemed clinically appropriate.  Underlying periportal edema is noted.  There is diffuse wall thickening and a small amount of pericholecystic fluid with respect to the gallbladder; the gallbladder lumen is relatively contracted. This is nonspecific, but raises question for cholecystitis.  Would correlate for associated symptoms.  The liver is otherwise grossly unremarkable in appearance.  The spleen is enlarged, measuring 14.2 cm, and appears somewhat bulky.  The pancreas and adrenal glands are unremarkable.  Numerous large bilateral renal cysts are again seen, measuring up to 8.2 cm in size.  There is associated distortion of the renal parenchyma bilaterally.  Mild nonspecific perinephric stranding is seen.  There is no evidence of hydronephrosis.  No renal or ureteral stones are identified.  No free fluid is identified.  The small bowel is unremarkable in appearance.  The stomach is within normal limits.  No acute vascular abnormalities are seen.  Extensive diffuse calcification is noted along the abdominal aorta and its branches, including along the proximal renal arteries bilaterally.  There is likely moderate to severe stenosis along the common and external iliac arteries bilaterally, due to extensive calcific atherosclerotic disease.  The patient is status post appendectomy.  The colon is difficult to fully characterize due to surrounding bowel loops, but  there may be mild colonic wall thickening along the sigmoid colon, which could reflect a mild infectious or inflammatory process.  The bladder is mildly distended and grossly unremarkable in appearance.  The uterus is not well characterized.  No suspicious adnexal masses are seen.  No inguinal lymphadenopathy is seen.  No acute osseous abnormalities are identified.  IMPRESSION:  1.  Question of mild colonic wall thickening along the sigmoid colon, which could reflect a mild infectious or inflammatory process.  Alternatively, this could simply reflect decompression; the colon is difficult to fully assess. 2.  Diffuse wall thickening and a small amount of pericholecystic fluid with respect to the gallbladder, new from the prior study. The gallbladder lumen is relatively contracted.  This is nonspecific, but raises question for cholecystitis.  Would correlate for associated symptoms. 3.  Note again made of marked prominence of the biliary ducts within the left hepatic lobe, with marked distension of the common hepatic duct to 1.8 cm in diameter.  As before, this raises concern for underlying obstructive process, suggest cholangiocarcinoma; MRCP could be considered for further evaluation, when and as deemed clinically appropriate (when and if the patient can tolerate holding her breath for the MRI study). 4.  Increased interstitial prominence of the lung bases could reflect mild interstitial edema. 5.  Splenomegaly noted. 6.  Underlying periportal edema seen. 7.  Numerous large bilateral renal cysts again seen, with distortion of the underlying renal parenchyma. 8.  Extensive diffuse calcification along the abdominal aorta and its branches, including along the proximal renal arteries bilaterally.  Likely moderate to severe stenosis along the common and external iliac arteries bilaterally, due to calcific atherosclerotic disease.   Original Report Authenticated By: Tonia Ghent, M.D.    I personally reviewed the  imaging tests through PACS system I reviewed available ER/hospitalization records through the EMR    Date: 08/19/2012  Rate: 66  Rhythm: atrial fibrillation  QRS Axis: normal  Intervals: normal  ST/T Wave abnormalities: normal  Conduction Disutrbances: none  Narrative Interpretation:   Old EKG Reviewed: No significant changes noted     1. Abdominal pain   2. Diarrhea   3. Thrombocytosis       MDM   10:56 PM And right upper quadrant tenderness at this time patient's ultrasound demonstrates no frank evidence of cholecystitis.  There is concerning for possible distal obstruction leading to biliary dilatation.  Given the patient's abdominal pain and diarrhea as well as her thrombocytosis I think she would benefit from at least observational stay overnight for ongoing treatment of her abdominal pain and workup of this condition.  I spoke with on-call hematologist Dr. Arline Asp who asked that I send a JAK-2 test in the pelvis could represent primary thrombocytosis.  He thinks the patient will receive a repeat platelet count in one week and likely referral to his office for outpatient workup.  He does recommend aspirin at this time to decrease the risk of thrombosis but does not recommend hydroxyurea at this time        Lyanne Co, MD 08/20/12 [phone]

## 2012-08-19 NOTE — ED Notes (Signed)
Pt transported to CT ?

## 2012-08-19 NOTE — ED Notes (Signed)
Pt placed on bedpan

## 2012-08-19 NOTE — ED Notes (Signed)
Pt returned from CT °

## 2012-08-19 NOTE — ED Notes (Signed)
IV infiltrated while pt was receiving contrast in CT

## 2012-08-19 NOTE — ED Notes (Signed)
Pt drinking contrast at this time.

## 2012-08-19 NOTE — ED Notes (Signed)
Pt finished contrast. CT paged.  

## 2012-08-19 NOTE — ED Notes (Signed)
Pt transported to US

## 2012-08-19 NOTE — ED Notes (Signed)
Pt cleaned, linens changed, placed pad on pt. New blankets given.

## 2012-08-20 ENCOUNTER — Encounter (HOSPITAL_COMMUNITY): Payer: Self-pay | Admitting: *Deleted

## 2012-08-20 DIAGNOSIS — D473 Essential (hemorrhagic) thrombocythemia: Secondary | ICD-10-CM

## 2012-08-20 DIAGNOSIS — K838 Other specified diseases of biliary tract: Secondary | ICD-10-CM

## 2012-08-20 DIAGNOSIS — E860 Dehydration: Secondary | ICD-10-CM

## 2012-08-20 DIAGNOSIS — R197 Diarrhea, unspecified: Secondary | ICD-10-CM

## 2012-08-20 DIAGNOSIS — L02619 Cutaneous abscess of unspecified foot: Secondary | ICD-10-CM

## 2012-08-20 DIAGNOSIS — R109 Unspecified abdominal pain: Secondary | ICD-10-CM

## 2012-08-20 HISTORY — DX: Other specified diseases of biliary tract: K83.8

## 2012-08-20 LAB — CBC
HCT: 36.7 % (ref 36.0–46.0)
MCV: 96.3 fL (ref 78.0–100.0)
Platelets: 1627 10*3/uL (ref 150–400)
RBC: 3.81 MIL/uL — ABNORMAL LOW (ref 3.87–5.11)
WBC: 12.1 10*3/uL — ABNORMAL HIGH (ref 4.0–10.5)

## 2012-08-20 LAB — TSH: TSH: 1.506 u[IU]/mL (ref 0.350–4.500)

## 2012-08-20 LAB — PATHOLOGIST SMEAR REVIEW

## 2012-08-20 LAB — CREATININE, SERUM: GFR calc Af Amer: >90 mL/min (ref >90)

## 2012-08-20 MED ORDER — ONDANSETRON HCL 4 MG PO TABS: 4.0000 mg | ORAL_TABLET | Freq: Four times a day (QID) | ORAL | Status: DC | PRN

## 2012-08-20 MED ORDER — GABAPENTIN 300 MG PO CAPS
300.0000 mg | ORAL_CAPSULE | Freq: Two times a day (BID) | ORAL | Status: DC
  Administered 2012-08-20 – 2012-08-28 (×17): 300 mg via ORAL
  Filled 2012-08-20 (×18): qty 1

## 2012-08-20 MED ORDER — RISAQUAD PO CAPS
1.0000 | ORAL_CAPSULE | Freq: Two times a day (BID) | ORAL | Status: DC
Start: 2012-08-20 — End: 2012-08-28
  Administered 2012-08-20 – 2012-08-28 (×17): 1 via ORAL
  Filled 2012-08-20 (×19): qty 1

## 2012-08-20 MED ORDER — CHOLESTYRAMINE LIGHT 4 G PO PACK
8.0000 g | PACK | Freq: Every day | ORAL | Status: DC
  Administered 2012-08-20: 8 g via ORAL
  Filled 2012-08-20 (×2): qty 2

## 2012-08-20 MED ORDER — MORPHINE SULFATE ER 15 MG PO TBCR
45.0000 mg | EXTENDED_RELEASE_TABLET | Freq: Three times a day (TID) | ORAL | Status: DC
  Administered 2012-08-20 – 2012-08-28 (×20): 45 mg via ORAL
  Filled 2012-08-20 (×13): qty 3
  Filled 2012-08-20: qty 1
  Filled 2012-08-20 (×8): qty 3

## 2012-08-20 MED ORDER — SILVER SULFADIAZINE 1 % EX CREA
1.0000 "application " | TOPICAL_CREAM | CUTANEOUS | Status: DC
  Administered 2012-08-20 – 2012-08-28 (×4): 1 via TOPICAL
  Filled 2012-08-20: qty 85

## 2012-08-20 MED ORDER — ONDANSETRON HCL 4 MG/2ML IJ SOLN: 4.0000 mg | Freq: Four times a day (QID) | INTRAMUSCULAR | Status: DC | PRN

## 2012-08-20 MED ORDER — KCL IN DEXTROSE-NACL 20-5-0.9 MEQ/L-%-% IV SOLN
INTRAVENOUS | Status: DC
  Administered 2012-08-20 – 2012-08-22 (×5): via INTRAVENOUS
  Administered 2012-08-27: 10 mL/h via INTRAVENOUS
  Filled 2012-08-20 (×11): qty 1000

## 2012-08-20 MED ORDER — ISOSORBIDE MONONITRATE ER 60 MG PO TB24
60.0000 mg | ORAL_TABLET | Freq: Every day | ORAL | Status: DC
  Administered 2012-08-20 – 2012-08-28 (×9): 60 mg via ORAL
  Filled 2012-08-20 (×10): qty 1

## 2012-08-20 MED ORDER — DIGOXIN 125 MCG PO TABS
0.1250 mg | ORAL_TABLET | Freq: Every day | ORAL | Status: DC
  Administered 2012-08-20 – 2012-08-21 (×2): 0.125 mg via ORAL
  Filled 2012-08-20 (×3): qty 1

## 2012-08-20 MED ORDER — ENOXAPARIN SODIUM 40 MG/0.4ML ~~LOC~~ SOLN
40.0000 mg | SUBCUTANEOUS | Status: DC
  Administered 2012-08-20 – 2012-08-21 (×2): 40 mg via SUBCUTANEOUS
  Filled 2012-08-20 (×3): qty 0.4

## 2012-08-20 MED ORDER — MIRTAZAPINE 7.5 MG PO TABS
7.5000 mg | ORAL_TABLET | Freq: Every day | ORAL | Status: DC
  Administered 2012-08-20 – 2012-08-27 (×8): 7.5 mg via ORAL
  Filled 2012-08-20 (×9): qty 1

## 2012-08-20 MED ORDER — HYDROCODONE-ACETAMINOPHEN 5-325 MG PO TABS
1.0000 | ORAL_TABLET | ORAL | Status: DC | PRN
  Administered 2012-08-20 – 2012-08-27 (×6): 1 via ORAL
  Filled 2012-08-20 (×6): qty 1

## 2012-08-20 MED ORDER — MEMANTINE HCL 5 MG PO TABS
5.0000 mg | ORAL_TABLET | Freq: Two times a day (BID) | ORAL | Status: DC
  Administered 2012-08-20 – 2012-08-28 (×17): 5 mg via ORAL
  Filled 2012-08-20 (×18): qty 1

## 2012-08-20 MED ORDER — HYDROXYUREA 500 MG PO CAPS
500.0000 mg | ORAL_CAPSULE | Freq: Two times a day (BID) | ORAL | Status: DC
  Administered 2012-08-20: 500 mg via ORAL
  Filled 2012-08-20 (×2): qty 1

## 2012-08-20 MED ORDER — ALPRAZOLAM 0.25 MG PO TABS
0.2500 mg | ORAL_TABLET | Freq: Two times a day (BID) | ORAL | Status: DC
  Administered 2012-08-20 – 2012-08-28 (×13): 0.25 mg via ORAL
  Filled 2012-08-20 (×13): qty 1

## 2012-08-20 MED ORDER — POLYETHYLENE GLYCOL 3350 17 G PO PACK
17.0000 g | PACK | Freq: Every day | ORAL | Status: DC
  Administered 2012-08-20 – 2012-08-21 (×2): 17 g via ORAL
  Filled 2012-08-20 (×2): qty 1

## 2012-08-20 MED ORDER — BOOST / RESOURCE BREEZE PO LIQD
1.0000 | Freq: Three times a day (TID) | ORAL | Status: DC
  Administered 2012-08-20 – 2012-08-28 (×16): 1 via ORAL

## 2012-08-20 MED ORDER — PANTOPRAZOLE SODIUM 40 MG PO TBEC
40.0000 mg | DELAYED_RELEASE_TABLET | Freq: Every day | ORAL | Status: DC
  Administered 2012-08-20 – 2012-08-28 (×9): 40 mg via ORAL
  Filled 2012-08-20 (×9): qty 1

## 2012-08-20 MED ORDER — ENSURE COMPLETE PO LIQD: 237.0000 mL | Freq: Two times a day (BID) | ORAL | Status: DC

## 2012-08-20 MED ORDER — HYDROXYUREA 500 MG PO CAPS: 15.0000 mg/kg | ORAL_CAPSULE | Freq: Two times a day (BID) | ORAL | Status: DC

## 2012-08-20 MED ORDER — ASPIRIN 81 MG PO CHEW
81.0000 mg | CHEWABLE_TABLET | Freq: Every day | ORAL | Status: DC
  Administered 2012-08-22 – 2012-08-28 (×7): 81 mg via ORAL
  Filled 2012-08-20 (×11): qty 1

## 2012-08-20 MED ORDER — FLORANEX PO PACK
1.0000 g | PACK | Freq: Two times a day (BID) | ORAL | Status: DC
Start: 2012-08-20 — End: 2012-08-20

## 2012-08-20 MED ORDER — ENSURE IMMUNE HEALTH PO LIQD: 237.0000 mL | Freq: Two times a day (BID) | ORAL | Status: DC

## 2012-08-20 MED ORDER — DULOXETINE HCL 60 MG PO CPEP
60.0000 mg | ORAL_CAPSULE | Freq: Every day | ORAL | Status: DC
  Administered 2012-08-20 – 2012-08-28 (×9): 60 mg via ORAL
  Filled 2012-08-20 (×9): qty 1

## 2012-08-20 MED ORDER — METOPROLOL TARTRATE 25 MG PO TABS
25.0000 mg | ORAL_TABLET | Freq: Every day | ORAL | Status: DC
  Administered 2012-08-20 – 2012-08-21 (×2): 25 mg via ORAL
  Filled 2012-08-20 (×3): qty 1

## 2012-08-20 MED ORDER — AMLODIPINE BESYLATE 10 MG PO TABS
10.0000 mg | ORAL_TABLET | Freq: Every day | ORAL | Status: DC
  Administered 2012-08-20 – 2012-08-28 (×9): 10 mg via ORAL
  Filled 2012-08-20 (×10): qty 1

## 2012-08-20 NOTE — ED Notes (Signed)
MD at bedside. Hospitalist

## 2012-08-20 NOTE — Progress Notes (Addendum)
[facility] PROGRESS NOTE  Tamara Turner ZOX:[id number] DOB: July 26, 1921 DOA: 08/19/2012 PCP: Florentina Jenny, MD  Brief narrative Tamara Turner is an 79 y.o. female with hx of PVD, DVT, HTN, depression, and right lateral ankle infection recently on antibiotic, presents to the ER with abdominal pain and diarrhea. She said her diarrhea has been for three days. She felt weak. Evaluation in the ER showed platelet count of 1.8 millions, with normal other indices. She also had a CT scan of the abdomen, which showed intrahepatic dilatation to 1.8 cm, and pericolic fluid, suggestive of cholecystitis or obtructive process as in cholangiocarcinoma. She also has segment of colonic wall thickening which could represents inflammatory process vs infectious process. Hospitalist was asked to admit this patients for the above reasons.   Assessment/Plan: 1. Diarrhea: Acute viral gastroenteritis versus rule out C. difficile. Improved. Patient indicates that she had a normal consistency BM today. Monitor. Diet as tolerated. 2. Thrombocytosis, ?chronic: EDP discussed with Dr. Arline Asp recommended checking JAK-2 and outpatient followup in his office in a week's time with repeat CBC. He also recommended aspirin but no hydroxyurea at this time. 3. Abdominal pain, abnormal imaging (ultrasound abdomen and CT abdomen):? Mild colonic wall thickening along the sigmoid colon, gallbladder wall thickening, distended, hepatic duct and splenomegaly. Discuss with [facility]-need for further workup IP versus OP. Abdominal pain has resolved. 4. HTN: Controlled. 5. History of A. fib (per EDP note): Episodes of PVCs and bradycardia in the ED. Patient on digoxin- level normal. Will place on telemetry.  Code Status: DO NOT RESUSCITATE Family Communication: Discussed with patient Disposition Plan: Home in medically stable.   Consultants:  EDP discussed with Dr. Arline Asp, Oncology  Procedures:  None  Antibiotics:  None    HPI/Subjective: No further diarrhea. Formed BM x1 today. No nausea, vomiting or abdominal pain. Hungry.  Objective: Filed Vitals:   08/20/12 0345 08/20/12 0430 08/20/12 0545 08/20/12 1400  BP: 144/68 140/77 159/69 124/63  Pulse: 71 53 68 55  Temp:   98.5 F (36.9 C) 97.1 F (36.2 C)  TempSrc:   Oral Oral  Resp: 13 18 18 18   Height:   5\' 8"  (1.727 m)   Weight:   46.267 kg (102 lb)   SpO2: 98% 98% 97% 99%    Intake/Output Summary (Last 24 hours) at 08/20/12 1600 Last data filed at 08/20/12 1500  Gross per 24 hour  Intake   1120 ml  Output   1650 ml  Net   -530 ml   Filed Weights   08/20/12 0545  Weight: 46.267 kg (102 lb)    Exam:   General exam: Comfortable.  Respiratory system: Clear. No increased work of breathing.  Cardiovascular system: S1 & S2 heard, RRR. No JVD, murmurs, gallops, clicks or pedal edema.  Gastrointestinal system: Abdomen is nondistended, soft and nontender. Normal bowel sounds heard.  Central nervous system: Alert and oriented. No focal neurological deficits.  Extremities: Symmetric 5 x 5 power.   Data Reviewed: Basic Metabolic Panel:  Recent Labs Lab 08/19/12 1727 08/19/12 1751 08/20/12 0648  NA 136 140  --   K 4.5 4.4  --   CL 96 97  --   CO2 32  --   --   GLUCOSE 104* 107*  --   BUN 24* 25*  --   CREATININE 0.79 0.90 0.63  CALCIUM 9.8  --   --    Liver Function Tests:  Recent Labs Lab 08/19/12 1727  AST 26  ALT 7  ALKPHOS 133*  BILITOT 0.4  PROT 7.3  ALBUMIN 3.8    Recent Labs Lab 08/19/12 1727  LIPASE 23   No results found for this basename: AMMONIA,  in the last 168 hours CBC:  Recent Labs Lab 08/19/12 1727 08/19/12 1751 08/19/12 1913 08/20/12 0648  WBC 12.0*  --  11.9* 12.1*  HGB 12.2 13.9 11.4* 12.0  HCT 38.0 41.0 35.5* 36.7  MCV 97.9  --  99.7 96.3  PLT 1892*  --  1797* 1627*   Cardiac Enzymes:  Recent Labs Lab 08/19/12 1727  TROPONINI <0.30   BNP (last 3 results) No results found  for this basename: PROBNP,  in the last 8760 hours CBG:  Recent Labs Lab 08/19/12 1703  GLUCAP 81    Recent Results (from the past 240 hour(s))  MRSA PCR SCREENING     Status: None   Collection Time    08/20/12  6:09 AM      Result Value Range Status   MRSA by PCR NEGATIVE  NEGATIVE Final   Comment:            The GeneXpert MRSA Assay (FDA     approved for NASAL specimens     only), is one component of a     comprehensive MRSA colonization     surveillance program. It is not     intended to diagnose MRSA     infection nor to guide or     monitor treatment for     MRSA infections.     Studies: US Abdomen Complete  08/20/2012  *RADIOLOGY REPORT*  Clinical Data:  Right upper quadrant abdominal pain.  ABDOMINAL ULTRASOUND COMPLETE  Comparison:  CT of the abdomen and pelvis performed earlier today at 09:16 p.m.  Findings:  Gallbladder: The gallbladder is diffusely thick-walled, with the wall measuring up to 0.7 cm in thickness.  No pericholecystic fluid is seen.  This is nonspecific, and could reflect a variety of conditions, such as hypoalbuminemia or congestive right heart failure.  No stones are identified; no ultrasonographic Murphy's sign is elicited.  Common Bile Duct:  1.3 cm in diameter; diffusely dilated, of uncertain significance.  Liver: Diffusely increased hepatic echogenicity and coarsened echotexture, compatible with fatty infiltration.  Intrahepatic biliary ductal dilatation at the left hepatic lobe is not well characterized on ultrasound.  A 1.2 cm cyst is noted within the inferior aspect of the right hepatic lobe.  Limited Doppler evaluation demonstrates normal blood flow within the liver.  IVC:  Unremarkable in appearance.  Pancreas:  Although the pancreas is difficult to visualize in its entirety due to overlying bowel gas, no focal pancreatic abnormality is identified.  Spleen:  14.4 cm in length; increased in size, with increased echogenicity.  Right kidney:  9.8 cm in  length; grossly normal in size and configuration.  Diffusely increased parenchymal echogenicity is noted.  No evidence of hydronephrosis.  Multiple large right-sided renal cysts are seen, measuring up to 9.0 cm in size.  Left kidney:  11.1 cm in length; grossly normal in size and configuration.  Diffusely increased parenchymal echogenicity is noted.  No evidence of hydronephrosis. Multiple large left-sided renal cysts are seen, measuring up to 5.9 cm in size.  Abdominal Aorta:  Normal in caliber; no aneurysm identified. Scattered calcific atherosclerotic disease is noted along the visualized abdominal aorta.  IMPRESSION:  1.  Diffusely thick-walled gallbladder, with the wall measuring up to 0.7 cm in thickness.  No pericholecystic fluid seen; no stones identified.  No ultrasonographic Murphy's sign elicited.  Findings are nonspecific, and could reflect a variety of conditions, such as hypoalbuminemia or congestive right heart failure. 2.  Diffuse dilatation of the common hepatic duct, of uncertain significance.  Intrahepatic biliary ductal dilatation at the left hepatic lobe is not well characterized on ultrasound.  Findings on CT raise concern for distal obstruction, not well assessed on this study.  As suggested on CT, MRCP could be considered for further evaluation, when and as deemed clinically appropriate (when and if the patient can tolerate holding her breath for the study). 3.  Diffuse fatty infiltration within the liver.  Small hepatic cyst seen. 4.  Splenomegaly again noted. 5.  Large bilateral renal cysts seen. 6.  Diffusely increased renal parenchymal echogenicity noted bilaterally, raising question for medical renal disease. 7.  Scattered calcific atherosclerotic disease noted along the visualized abdominal aorta.   Original Report Authenticated By: Tonia Ghent, M.D.    Ct Abdomen Pelvis W Contrast  08/19/2012  *RADIOLOGY REPORT*  Clinical Data: Diarrhea.  CT ABDOMEN AND PELVIS WITH CONTRAST   Technique:  Multidetector CT imaging of the abdomen and pelvis was performed following the standard protocol during bolus administration of intravenous contrast.  Contrast: 80mL OMNIPAQUE IOHEXOL 300 MG/ML  SOLN  Comparison: CT of the abdomen and pelvis performed 03/28/2012  Findings: Increased interstitial prominence at the lung bases could reflect mild interstitial edema.  Note is again made of marked prominence of the biliary ducts within the left hepatic lobe, with marked distension of the common hepatic duct, measuring 1.8 cm in diameter.  This again raises concern for an underlying obstructive process, such as cholangiocarcinoma; as before, MRCP could be considered for further evaluation, when and as deemed clinically appropriate.  Underlying periportal edema is noted.  There is diffuse wall thickening and a small amount of pericholecystic fluid with respect to the gallbladder; the gallbladder lumen is relatively contracted. This is nonspecific, but raises question for cholecystitis.  Would correlate for associated symptoms.  The liver is otherwise grossly unremarkable in appearance.  The spleen is enlarged, measuring 14.2 cm, and appears somewhat bulky.  The pancreas and adrenal glands are unremarkable.  Numerous large bilateral renal cysts are again seen, measuring up to 8.2 cm in size.  There is associated distortion of the renal parenchyma bilaterally.  Mild nonspecific perinephric stranding is seen.  There is no evidence of hydronephrosis.  No renal or ureteral stones are identified.  No free fluid is identified.  The small bowel is unremarkable in appearance.  The stomach is within normal limits.  No acute vascular abnormalities are seen.  Extensive diffuse calcification is noted along the abdominal aorta and its branches, including along the proximal renal arteries bilaterally.  There is likely moderate to severe stenosis along the common and external iliac arteries bilaterally, due to extensive  calcific atherosclerotic disease.  The patient is status post appendectomy.  The colon is difficult to fully characterize due to surrounding bowel loops, but there may be mild colonic wall thickening along the sigmoid colon, which could reflect a mild infectious or inflammatory process.  The bladder is mildly distended and grossly unremarkable in appearance.  The uterus is not well characterized.  No suspicious adnexal masses are seen.  No inguinal lymphadenopathy is seen.  No acute osseous abnormalities are identified.  IMPRESSION:  1.  Question of mild colonic wall thickening along the sigmoid colon, which could reflect a mild infectious or inflammatory process.  Alternatively, this could simply reflect decompression;  the colon is difficult to fully assess. 2.  Diffuse wall thickening and a small amount of pericholecystic fluid with respect to the gallbladder, new from the prior study. The gallbladder lumen is relatively contracted.  This is nonspecific, but raises question for cholecystitis.  Would correlate for associated symptoms. 3.  Note again made of marked prominence of the biliary ducts within the left hepatic lobe, with marked distension of the common hepatic duct to 1.8 cm in diameter.  As before, this raises concern for underlying obstructive process, suggest cholangiocarcinoma; MRCP could be considered for further evaluation, when and as deemed clinically appropriate (when and if the patient can tolerate holding her breath for the MRI study). 4.  Increased interstitial prominence of the lung bases could reflect mild interstitial edema. 5.  Splenomegaly noted. 6.  Underlying periportal edema seen. 7.  Numerous large bilateral renal cysts again seen, with distortion of the underlying renal parenchyma. 8.  Extensive diffuse calcification along the abdominal aorta and its branches, including along the proximal renal arteries bilaterally.  Likely moderate to severe stenosis along the common and external  iliac arteries bilaterally, due to calcific atherosclerotic disease.   Original Report Authenticated By: Tonia Ghent, M.D.      Additional labs:   Scheduled Meds: . acidophilus  1 capsule Oral BID  . ALPRAZolam  0.25 mg Oral BID  . amLODipine  10 mg Oral Q breakfast  . aspirin  81 mg Oral Q breakfast  . cholestyramine light  8 g Oral Q lunch  . digoxin  0.125 mg Oral Q breakfast  . DULoxetine  60 mg Oral Daily  . enoxaparin (LOVENOX) injection  40 mg Subcutaneous Q24H  . feeding supplement  1 Container Oral TID BM  . gabapentin  300 mg Oral BID  . hydroxyurea  500 mg Oral BID  . isosorbide mononitrate  60 mg Oral Q breakfast  . memantine  5 mg Oral BID  . metoprolol tartrate  25 mg Oral Q breakfast  . mirtazapine  7.5 mg Oral QHS  . morphine  45 mg Oral TID  . pantoprazole  40 mg Oral Daily  . polyethylene glycol  17 g Oral Daily  . silver sulfADIAZINE  1 application Topical Q48H   Continuous Infusions: . sodium chloride 1,000 mL (08/19/12 1923)  . dextrose 5 % and 0.9 % NaCl with KCl 20 mEq/L 100 mL/hr at 08/20/12 0746    Active Problems:   Weight loss   Thrombocytosis   Diarrhea   Dilated intrahepatic bile duct   Dehydration, mild    Time spent: 40 minutes    [facility]  [facility] Pager [phone].   If 8PM-8AM, please contact night-coverage at [email or web address], password [facility] 08/20/2012, 4:00 PM  LOS: 1 day

## 2012-08-20 NOTE — H&P (Signed)
[facility] History and Physical  Tamara Turner ZOX:[id number] DOB: [birth year]    PCP:   Florentina Jenny, MD   Chief Complaint: abdominal pain and diarrhea.  HPI: Tamara Turner is an 79 y.o. female with hx of PVD, DVT, HTN, depression, and right lateral ankle infection recently on antibiotic, presents to the ER with abdominal pain and diarrhea.  She said her diarrhea has been for three days.  She felt weak.  Evaluation in the ER showed platelet count of 1.8 millions, with normal other indices.  She also had a CT scan of the abdomen, which showed intrahepatic dilatation to 1.8 cm, and pericolic fluid, suggestive of cholecystitis or obtructive process as in cholangiocarcinoma.  She also has segment of colonic wall thickening which could represents inflammatory process vs infectious process.  Hospitalist was asked to admit this patients for the above reasons.  Rewiew of Systems:  Constitutional: Negative for malaise, fever and chills. No significant weight loss or weight gain Eyes: Negative for eye pain, redness and discharge, diplopia, visual changes, or flashes of light. ENMT: Negative for ear pain, hoarseness, nasal congestion, sinus pressure and sore throat. No headaches; tinnitus, drooling, or problem swallowing. Cardiovascular: Negative for chest pain, palpitations, diaphoresis, dyspnea and peripheral edema. ; No orthopnea, PND Respiratory: Negative for cough, hemoptysis, wheezing and stridor. No pleuritic chestpain. Gastrointestinal: Negative for nausea, vomiting, constipation, abdominal pain, melena, blood in stool, hematemesis, jaundice and rectal bleeding.    Genitourinary: Negative for frequency, dysuria, incontinence,flank pain and hematuria; Musculoskeletal: Negative for back pain and neck pain. Negative for swelling and trauma.;  Skin: . Negative for pruritus, rash, abrasions, bruising and skin lesion.; ulcerations Neuro: Negative for headache, lightheadedness and neck  stiffness. Negative for weakness, altered level of consciousness , altered mental status, extremity weakness, burning feet, involuntary movement, seizure and syncope.  Psych: negative for anxiety, depression, insomnia, tearfulness, panic attacks, hallucinations, paranoia, suicidal or homicidal ideation    Past Medical History  Diagnosis Date  . Vascular abnormality   . DVT (deep venous thrombosis)   . Alcoholism   . Depression   . Hypertension   . UTI (lower urinary tract infection)   . Chronic back pain   . Arthritis     all over  . Renal disorder   . Heart disease   . Hyperlipidemia   . Phlebitis   . UTI (urinary tract infection)     Past Surgical History  Procedure Laterality Date  . Thrombectomy    . Shoulder surgery    . Hand surgery    . Appendectomy    . Coronary angioplasty with stent placement    . Spine surgery      x 3    Medications:  HOME MEDS: Prior to Admission medications   Medication Sig Start Date End Date Taking? Authorizing Provider  ALPRAZolam (XANAX) 0.25 MG tablet Take 0.25 mg by mouth 2 (two) times daily.   Yes Historical Provider, MD  alum & mag hydroxide-simeth (MI-ACID) 200-200-20 MG/5ML suspension Take 15 mLs by mouth 3 (three) times daily.   Yes Historical Provider, MD  amLODipine (NORVASC) 10 MG tablet Take 10 mg by mouth daily with breakfast.    Yes Historical Provider, MD  aspirin 81 MG chewable tablet Chew 81 mg by mouth daily with breakfast.   Yes Historical Provider, MD  calcium-vitamin D (OSCAL WITH D) 500-200 MG-UNIT per tablet Take 1 tablet by mouth daily with breakfast.   Yes Historical Provider, MD  cholestyramine light (PREVALITE) 4 G packet  Take 8 g by mouth daily before breakfast.    Yes Historical Provider, MD  digoxin (LANOXIN) 0.125 MG tablet Take 0.125 mg by mouth daily with breakfast.    Yes Historical Provider, MD  DULoxetine (CYMBALTA) 60 MG capsule Take 1 capsule (60 mg total) by mouth daily. 05/23/12  Yes Meredeth Ide, MD   feeding supplement (ENSURE IMMUNE HEALTH) LIQD Take 237 mLs by mouth 2 (two) times daily before lunch and supper.   Yes Historical Provider, MD  gabapentin (NEURONTIN) 300 MG capsule Take 300 mg by mouth 2 (two) times daily.    Yes Historical Provider, MD  HYDROcodone-acetaminophen (NORCO/VICODIN) 5-325 MG per tablet Take 1 tablet by mouth every 4 (four) hours as needed for pain. Pain 05/23/12  Yes Meredeth Ide, MD  isosorbide mononitrate (IMDUR) 60 MG 24 hr tablet Take 60 mg by mouth daily with breakfast.    Yes Historical Provider, MD  lactobacillus (FLORANEX/LACTINEX) PACK Take 1 g by mouth 2 (two) times daily.   Yes Historical Provider, MD  lansoprazole (PREVACID) 30 MG capsule Take 30 mg by mouth daily with breakfast.   Yes Historical Provider, MD  loperamide (IMODIUM) 2 MG capsule Take 2-4 mg by mouth as needed. Take 2 caps by mouth after first loose stool then 1 cap by mouth after each subsequent   Yes Historical Provider, MD  memantine (NAMENDA) 5 MG tablet Take 5 mg by mouth 2 (two) times daily.   Yes Historical Provider, MD  metoprolol tartrate (LOPRESSOR) 25 MG tablet Take 25 mg by mouth daily with breakfast.    Yes Historical Provider, MD  mirtazapine (REMERON) 15 MG tablet Take 7.5 mg by mouth at bedtime.   Yes Historical Provider, MD  morphine (MS CONTIN) 15 MG 12 hr tablet Take 45 mg by mouth 3 (three) times daily.   Yes Historical Provider, MD  Multiple Vitamins-Minerals (PRESERVISION AREDS PO) Take 1 capsule by mouth 2 (two) times daily.   Yes Historical Provider, MD  polyethylene glycol (MIRALAX / GLYCOLAX) packet Take 17 g by mouth daily.    Yes Historical Provider, MD  silver sulfADIAZINE (SILVADENE) 1 % cream Apply 1 application topically every other day. With wound cleaning procedure   Yes Historical Provider, MD  spironolactone (ALDACTONE) 25 MG tablet Take 25 mg by mouth daily with breakfast.    Yes Historical Provider, MD     Allergies:  Allergies  Allergen Reactions  .  Penicillins Hives  . Sulfa Antibiotics Nausea Only    Social History:   reports that she has been smoking Cigarettes.  She has been smoking about 0.25 packs per day. She has never used smokeless tobacco. She reports that she does not drink alcohol or use illicit drugs.  Family History: Family History  Problem Relation Age of Onset  . Colon cancer Neg Hx   . Pancreatic cancer Sister      Physical Exam: Filed Vitals:   08/19/12 2300 08/20/12 0030 08/20/12 0145 08/20/12 0200  BP: 162/51  151/70 141/67  Pulse: 76 63 65 64  Temp:      TempSrc:      Resp: 12 15 10 17   SpO2: 100% 94% 99% 100%   Blood pressure 141/67, pulse 64, temperature 97.6 F (36.4 C), temperature source Oral, resp. rate 17, SpO2 100.00%.  GEN:  Pleasant  patient lying in the stretcher in no acute distress; cooperative with exam.  She is rather cachectic. PSYCH:  alert and oriented x4; does not appear anxious or  depressed; affect is appropriate. HEENT: Mucous membranes pink and anicteric; PERRLA; EOM intact; no cervical lymphadenopathy nor thyromegaly or carotid bruit; no JVD; There were no stridor. Neck is very supple. Breasts:: Not examined CHEST WALL: No tenderness CHEST: Normal respiration, clear to auscultation bilaterally.  HEART: Regular rate and rhythm.  There are no murmur, rub, or gallops.   BACK: No kyphosis or scoliosis; no CVA tenderness ABDOMEN: soft and non-tender; no masses, no organomegaly, normal abdominal bowel sounds; no pannus; no intertriginous candida. There is no rebound and no distention. Rectal Exam: Not done EXTREMITIES: No bone or joint deformity; age-appropriate arthropathy of the hands and knees; no edema; no ulcerations.  There is no calf tenderness. Genitalia: not examined PULSES: 2+ and symmetric SKIN: Normal hydration no rash or ulceration CNS: Cranial nerves 2-12 grossly intact no focal lateralizing neurologic deficit.  Speech is fluent; uvula elevated with phonation, facial  symmetry and tongue midline. DTR are normal bilaterally, cerebella exam is intact, barbinski is negative and strengths are equaled bilaterally.  No sensory loss.   Labs on Admission:  Basic Metabolic Panel:  Recent Labs Lab 08/19/12 1727 08/19/12 1751  NA 136 140  K 4.5 4.4  CL 96 97  CO2 32  --   GLUCOSE 104* 107*  BUN 24* 25*  CREATININE 0.79 0.90  CALCIUM 9.8  --    Liver Function Tests:  Recent Labs Lab 08/19/12 1727  AST 26  ALT 7  ALKPHOS 133*  BILITOT 0.4  PROT 7.3  ALBUMIN 3.8    Recent Labs Lab 08/19/12 1727  LIPASE 23   No results found for this basename: AMMONIA,  in the last 168 hours CBC:  Recent Labs Lab 08/19/12 1727 08/19/12 1751 08/19/12 1913  WBC 12.0*  --  11.9*  HGB 12.2 13.9 11.4*  HCT 38.0 41.0 35.5*  MCV 97.9  --  99.7  PLT 1892*  --  1797*   Cardiac Enzymes:  Recent Labs Lab 08/19/12 1727  TROPONINI <0.30    CBG:  Recent Labs Lab 08/19/12 1703  GLUCAP 81     Radiological Exams on Admission: US Abdomen Complete  08/20/2012  *RADIOLOGY REPORT*  Clinical Data:  Right upper quadrant abdominal pain.  ABDOMINAL ULTRASOUND COMPLETE  Comparison:  CT of the abdomen and pelvis performed earlier today at 09:16 p.m.  Findings:  Gallbladder: The gallbladder is diffusely thick-walled, with the wall measuring up to 0.7 cm in thickness.  No pericholecystic fluid is seen.  This is nonspecific, and could reflect a variety of conditions, such as hypoalbuminemia or congestive right heart failure.  No stones are identified; no ultrasonographic Murphy's sign is elicited.  Common Bile Duct:  1.3 cm in diameter; diffusely dilated, of uncertain significance.  Liver: Diffusely increased hepatic echogenicity and coarsened echotexture, compatible with fatty infiltration.  Intrahepatic biliary ductal dilatation at the left hepatic lobe is not well characterized on ultrasound.  A 1.2 cm cyst is noted within the inferior aspect of the right hepatic lobe.   Limited Doppler evaluation demonstrates normal blood flow within the liver.  IVC:  Unremarkable in appearance.  Pancreas:  Although the pancreas is difficult to visualize in its entirety due to overlying bowel gas, no focal pancreatic abnormality is identified.  Spleen:  14.4 cm in length; increased in size, with increased echogenicity.  Right kidney:  9.8 cm in length; grossly normal in size and configuration.  Diffusely increased parenchymal echogenicity is noted.  No evidence of hydronephrosis.  Multiple large right-sided renal cysts  are seen, measuring up to 9.0 cm in size.  Left kidney:  11.1 cm in length; grossly normal in size and configuration.  Diffusely increased parenchymal echogenicity is noted.  No evidence of hydronephrosis. Multiple large left-sided renal cysts are seen, measuring up to 5.9 cm in size.  Abdominal Aorta:  Normal in caliber; no aneurysm identified. Scattered calcific atherosclerotic disease is noted along the visualized abdominal aorta.  IMPRESSION:  1.  Diffusely thick-walled gallbladder, with the wall measuring up to 0.7 cm in thickness.  No pericholecystic fluid seen; no stones identified.  No ultrasonographic Murphy's sign elicited.  Findings are nonspecific, and could reflect a variety of conditions, such as hypoalbuminemia or congestive right heart failure. 2.  Diffuse dilatation of the common hepatic duct, of uncertain significance.  Intrahepatic biliary ductal dilatation at the left hepatic lobe is not well characterized on ultrasound.  Findings on CT raise concern for distal obstruction, not well assessed on this study.  As suggested on CT, MRCP could be considered for further evaluation, when and as deemed clinically appropriate (when and if the patient can tolerate holding her breath for the study). 3.  Diffuse fatty infiltration within the liver.  Small hepatic cyst seen. 4.  Splenomegaly again noted. 5.  Large bilateral renal cysts seen. 6.  Diffusely increased renal  parenchymal echogenicity noted bilaterally, raising question for medical renal disease. 7.  Scattered calcific atherosclerotic disease noted along the visualized abdominal aorta.   Original Report Authenticated By: Tonia Ghent, M.D.    Ct Abdomen Pelvis W Contrast  08/19/2012  *RADIOLOGY REPORT*  Clinical Data: Diarrhea.  CT ABDOMEN AND PELVIS WITH CONTRAST  Technique:  Multidetector CT imaging of the abdomen and pelvis was performed following the standard protocol during bolus administration of intravenous contrast.  Contrast: 80mL OMNIPAQUE IOHEXOL 300 MG/ML  SOLN  Comparison: CT of the abdomen and pelvis performed 03/28/2012  Findings: Increased interstitial prominence at the lung bases could reflect mild interstitial edema.  Note is again made of marked prominence of the biliary ducts within the left hepatic lobe, with marked distension of the common hepatic duct, measuring 1.8 cm in diameter.  This again raises concern for an underlying obstructive process, such as cholangiocarcinoma; as before, MRCP could be considered for further evaluation, when and as deemed clinically appropriate.  Underlying periportal edema is noted.  There is diffuse wall thickening and a small amount of pericholecystic fluid with respect to the gallbladder; the gallbladder lumen is relatively contracted. This is nonspecific, but raises question for cholecystitis.  Would correlate for associated symptoms.  The liver is otherwise grossly unremarkable in appearance.  The spleen is enlarged, measuring 14.2 cm, and appears somewhat bulky.  The pancreas and adrenal glands are unremarkable.  Numerous large bilateral renal cysts are again seen, measuring up to 8.2 cm in size.  There is associated distortion of the renal parenchyma bilaterally.  Mild nonspecific perinephric stranding is seen.  There is no evidence of hydronephrosis.  No renal or ureteral stones are identified.  No free fluid is identified.  The small bowel is unremarkable  in appearance.  The stomach is within normal limits.  No acute vascular abnormalities are seen.  Extensive diffuse calcification is noted along the abdominal aorta and its branches, including along the proximal renal arteries bilaterally.  There is likely moderate to severe stenosis along the common and external iliac arteries bilaterally, due to extensive calcific atherosclerotic disease.  The patient is status post appendectomy.  The colon is difficult to  fully characterize due to surrounding bowel loops, but there may be mild colonic wall thickening along the sigmoid colon, which could reflect a mild infectious or inflammatory process.  The bladder is mildly distended and grossly unremarkable in appearance.  The uterus is not well characterized.  No suspicious adnexal masses are seen.  No inguinal lymphadenopathy is seen.  No acute osseous abnormalities are identified.  IMPRESSION:  1.  Question of mild colonic wall thickening along the sigmoid colon, which could reflect a mild infectious or inflammatory process.  Alternatively, this could simply reflect decompression; the colon is difficult to fully assess. 2.  Diffuse wall thickening and a small amount of pericholecystic fluid with respect to the gallbladder, new from the prior study. The gallbladder lumen is relatively contracted.  This is nonspecific, but raises question for cholecystitis.  Would correlate for associated symptoms. 3.  Note again made of marked prominence of the biliary ducts within the left hepatic lobe, with marked distension of the common hepatic duct to 1.8 cm in diameter.  As before, this raises concern for underlying obstructive process, suggest cholangiocarcinoma; MRCP could be considered for further evaluation, when and as deemed clinically appropriate (when and if the patient can tolerate holding her breath for the MRI study). 4.  Increased interstitial prominence of the lung bases could reflect mild interstitial edema. 5.   Splenomegaly noted. 6.  Underlying periportal edema seen. 7.  Numerous large bilateral renal cysts again seen, with distortion of the underlying renal parenchyma. 8.  Extensive diffuse calcification along the abdominal aorta and its branches, including along the proximal renal arteries bilaterally.  Likely moderate to severe stenosis along the common and external iliac arteries bilaterally, due to calcific atherosclerotic disease.   Original Report Authenticated By: Tonia Ghent, M.D.     Assessment/Plan Present on Admission:  . Thrombocytosis . Weight loss Colonic wall thickening. Intrahepatic dilatation Diarrhea PVD.  PLAN:  Originally, the EDP was concerned about the elevated platelet count as it was felt that this was new onset.  I reviewed the chart, however, and she actually had ET with platelet count of 1.7 millions before.  She was actually on Hydrea, but it was discontinued because of bone marrow suppression giving her anemia.  Now, her platelet count has again risen to above a million.  EDP spoke with heme/onc orginally and was recommended outpatient follow up.  I also found that the intrahepatic dilatation was also not new and GI was going to make further recommendation as she was last admitted.  She has diarrhea and is slightly volume depleted and will need to the the diarrhea work up.  I will admit her to general medical floor, resume her Hydrea at 15mg /Kg in BID doses.  Her stools will be sent for C diff and culture.  Consider having GI see her for follow up and getting MRI as recommended ( I have not ordered this).  She appears stable and will be admitted to [facility] service.  She is a DNR and we will honor her wishes.  Thank you for allowing me to partake in the care of your nice patient.  Other plans as per orders.  Code Status: DNR.   Houston Siren, MD. [facility] Pager [phone] 7pm to 7am.  08/20/2012, 3:32 AM

## 2012-08-20 NOTE — Consult Note (Addendum)
[facility] Consult: 4:34 PM 08/20/2012   Referring Shariq Puig: Waymon Amato  Primary Care Physician:  Florentina Jenny, MD Primary Gastroenterologist:  Dr. Leone Payor.   Reason for Consultation:  New GB and colon findings on the latest CT scan   HPI: Tamara Turner is a 78 y.o. female.  Years of chronic abdominal pain.  Dr Leone Payor evaluated in fall 2013.  EGD negative.  LFTs normal.  Has dementia,   CT 03/2012 scan with "Atrophy and intrahepatic biliary dilatation within the left hepatic lobe are concerning for a central obstructing process, potentially cholangiocarcinoma. The common bile duct is mildly dilated. MRCP/MRI 04/2012 with " 1. Left hepatic lobe atrophy and dilated tortuous irregular beaded bile ducts likely the sequela of prior inflammation/infection/cholangitis.  2. Mild dilatation of the right intrahepatic biliary tree but no inflammatory changes.  3. Common bile duct dilatation but no common bile duct stones or evidence of pancreatic head mass or ampullary lesion. This could be normal for age or could be due to a distal stricture.  Correlation with liver function studies is recommended.  Dr Leone Payor reviewed records from [facility] and in last note of 04/25/2012 stated: "MRI shows some changes that look to be from a prior inflammatory process in her liver. Could be related to some of her pain but with normal liver tests I doubt it very much. I do not recommend any more testing. As stated before - the few notes I was able to see from [facility] indicated chronic abdominal pain issues x many years. I am sorry but I have no other recommendations at this time and recommend she be followed by primary care overall."  Pt admitted with diarrhea for 24 hours PTA and abdominal pain. Repeat CT scan show ? Sigmoid thickening GB thickening but no pericholecystic fluid.   Diarrhea now resolved.  Had formed stool once yesterday.  No blood in  stool. The abdominal pain is in lower abdomen bilaterally.  It is most likely to come on post meals, it is relived by flatus and BMs.  Does not have daily BMs.  Has not had pain in RUQ.  The pain she was admitted with was in keeping as to distribution and intensity with previous chronic pain.  Note that both daily Miralx and Questran along with probiotic are on her home and inpt med list.   Is for HIDA scan around 12:30 today, case delayed due to getting vicodin early this AM.  Pt is Hungry and would love some water.     Past Medical History  Diagnosis Date  . Vascular abnormality   . DVT (deep venous thrombosis)   . Alcoholism   . Depression   . Hypertension   . UTI (lower urinary tract infection)   . Chronic back pain   . Arthritis     all over  . Renal disorder   . Heart disease   . Hyperlipidemia   . Phlebitis   . UTI (urinary tract infection)     Past Surgical History  Procedure Laterality Date  . Thrombectomy    . Shoulder surgery    . Hand surgery    . Appendectomy    . Coronary angioplasty with stent placement    . Spine surgery      x 3    Prior to Admission medications   Medication Sig Start Date End Date Taking? Authorizing Jefferey Lippmann  ALPRAZolam (XANAX) 0.25 MG tablet Take 0.25 mg by mouth 2 (two) times daily.   Yes Historical Franchelle Foskett, MD  alum & mag hydroxide-simeth (MI-ACID) 200-200-20 MG/5ML suspension Take 15 mLs by mouth 3 (three) times daily.   Yes Historical Teola Felipe, MD  amLODipine (NORVASC) 10 MG tablet Take 10 mg by mouth daily with breakfast.    Yes Historical Marvin Maenza, MD  aspirin 81 MG chewable tablet Chew 81 mg by mouth daily with breakfast.   Yes Historical Pharrell Ledford, MD  calcium-vitamin D (OSCAL WITH D) 500-200 MG-UNIT per tablet Take 1 tablet by mouth daily with breakfast.   Yes Historical Raneen Jaffer, MD  cholestyramine light (PREVALITE) 4 G packet Take 8 g by mouth daily before breakfast.    Yes Historical Raylea Adcox, MD  digoxin (LANOXIN) 0.125 MG  tablet Take 0.125 mg by mouth daily with breakfast.    Yes Historical Bayard More, MD  DULoxetine (CYMBALTA) 60 MG capsule Take 1 capsule (60 mg total) by mouth daily. 05/23/12  Yes Meredeth Ide, MD  feeding supplement (ENSURE IMMUNE HEALTH) LIQD Take 237 mLs by mouth 2 (two) times daily before lunch and supper.   Yes Historical Serigne Kubicek, MD  gabapentin (NEURONTIN) 300 MG capsule Take 300 mg by mouth 2 (two) times daily.    Yes Historical Faun Mcqueen, MD  HYDROcodone-acetaminophen (NORCO/VICODIN) 5-325 MG per tablet Take 1 tablet by mouth every 4 (four) hours as needed for pain. Pain 05/23/12  Yes Meredeth Ide, MD  isosorbide mononitrate (IMDUR) 60 MG 24 hr tablet Take 60 mg by mouth daily with breakfast.    Yes Historical Ramesses Crampton, MD  lactobacillus (FLORANEX/LACTINEX) PACK Take 1 g by mouth 2 (two) times daily.   Yes Historical Christabell Loseke, MD  lansoprazole (PREVACID) 30 MG capsule Take 30 mg by mouth daily with breakfast.   Yes Historical Jessye Imhoff, MD  loperamide (IMODIUM) 2 MG capsule Take 2-4 mg by mouth as needed. Take 2 caps by mouth after first loose stool then 1 cap by mouth after each subsequent   Yes Historical Bevin Das, MD  memantine (NAMENDA) 5 MG tablet Take 5 mg by mouth 2 (two) times daily.   Yes Historical Zan Orlick, MD  metoprolol tartrate (LOPRESSOR) 25 MG tablet Take 25 mg by mouth daily with breakfast.    Yes Historical Jermani Pund, MD  mirtazapine (REMERON) 15 MG tablet Take 7.5 mg by mouth at bedtime.   Yes Historical Carmie Lanpher, MD  morphine (MS CONTIN) 15 MG 12 hr tablet Take 45 mg by mouth 3 (three) times daily.   Yes Historical Zonie Crutcher, MD  Multiple Vitamins-Minerals (PRESERVISION AREDS PO) Take 1 capsule by mouth 2 (two) times daily.   Yes Historical Leif Loflin, MD  polyethylene glycol (MIRALAX / GLYCOLAX) packet Take 17 g by mouth daily.    Yes Historical Lindsay Straka, MD  silver sulfADIAZINE (SILVADENE) 1 % cream Apply 1 application topically every other day. With wound cleaning procedure    Yes Historical Keon Benscoter, MD  spironolactone (ALDACTONE) 25 MG tablet Take 25 mg by mouth daily with breakfast.    Yes Historical Drew Lips, MD    Scheduled Meds: . acidophilus  1 capsule Oral BID  . ALPRAZolam  0.25 mg Oral BID  . amLODipine  10 mg Oral Q breakfast  . aspirin  81 mg Oral Q breakfast  . cholestyramine light  8 g Oral Q lunch  . digoxin  0.125 mg Oral Q breakfast  . DULoxetine  60 mg Oral Daily  . enoxaparin (LOVENOX) injection  40 mg Subcutaneous Q24H  . feeding supplement  1 Container Oral TID BM  . gabapentin  300 mg Oral BID  . isosorbide mononitrate  60 mg Oral Q breakfast  . memantine  5 mg Oral BID  . metoprolol tartrate  25 mg Oral Q breakfast  . mirtazapine  7.5 mg Oral QHS  . morphine  45 mg Oral TID  . pantoprazole  40 mg Oral Daily  . polyethylene glycol  17 g Oral Daily  . silver sulfADIAZINE  1 application Topical Q48H   Infusions: . dextrose 5 % and 0.9 % NaCl with KCl 20 mEq/L 100 mL/hr at 08/20/12 0746   PRN Meds: HYDROcodone-acetaminophen, ondansetron (ZOFRAN) IV, ondansetron   Allergies as of 08/19/2012 - Review Complete 08/19/2012  Allergen Reaction Noted  . Penicillins Hives 02/14/2012  . Sulfa antibiotics Nausea Only 02/14/2012    Family History  Problem Relation Age of Onset  . Colon cancer Neg Hx   . Pancreatic cancer Sister     History   Social History  . Marital Status: Widowed    Spouse Name: N/A    Number of Children: 0  . Years of Education: N/A   Occupational History  . Retired     Social History Main Topics  . Smoking status: Current Every Day Smoker -- 0.25 packs/day    Types: Cigarettes  . Smokeless tobacco: Never Used  . Alcohol Use: No  . Drug Use: No  . Sexually Active: Not on file   Other Topics Concern  . Not on file   Social History Narrative   Widowed, moved to [place] to assisted living in August 2013 after many years in [place]    Daily caffeine 3 drinks daily   Next of kin is her niece  Janeece Agee    REVIEW OF SYSTEMS: No skin sores, itching or rash No hematuria or dysuria No current abdominal pain or nausea Occasional dysphagia to pills No swelling of legs, face or hands No falls.   No tinnitus, hearing is slightly diminished.  No seizures or headaches.     PHYSICAL EXAM: Vital signs in last 24 hours: Temp:  [97.1 F (36.2 C)-98.5 F (36.9 C)] 97.1 F (36.2 C) (03/05 1400) Pulse Rate:  [53-123] 55 (03/05 1400) Resp:  [10-20] 18 (03/05 1400) BP: (124-163)/(48-106) 124/63 mmHg (03/05 1400) SpO2:  [94 %-100 %] 99 % (03/05 1400) Weight:  [46.267 kg (102 lb)] 46.267 kg (102 lb) (03/05 0545)   General Elderly female in NAD.  She is cachectic. Alert and pleasant.  Skin No rashes; anicteric HEENT No pharyngeal abnormalities; pupils equal.  Moist and clear oral MM.  Dental repair poor but retains many native teeth.  Neck No masses, No thyromegaly Chest Clear to auscultation.  No resp distress Cardiac RRR. No murmurs, gallops, rubs Abdomen BS active; no masses, no hernias, NT, ND.  No HSM Rectal deferred Extremities  no cyanosis, clubbing, edema.  Muscular atrophy in limbs Skeletal No swelling.  Kyphotic thoracic spine.  No joint swelling. No contractures.  Neuro Alert; no focal abnormalities Some fine tremor of hands.  Moves all 4s.  Appropriate, not confused.   Intake/Output from previous day: 03/04 0701 - 03/05 0700 In: 1000 [I.V.:1000] Out: 650 [Urine:650] Intake/Output this shift: Total I/O In: 120 [P.O.:120] Out: 1000 [Urine:1000]  LAB RESULTS:  Recent Labs  08/19/12 1727 08/19/12 1751 08/19/12 1913 08/20/12 0648  WBC 12.0*  --  11.9* 12.1*  HGB 12.2 13.9 11.4* 12.0  HCT 38.0 41.0 35.5* 36.7  PLT 1892*  --  1797* 1627*   BMET Lab Results  Component Value Date   NA 140 08/19/2012  NA 136 08/19/2012   NA 130* 05/23/2012   K 4.4 08/19/2012   K 4.5 08/19/2012   K 4.2 05/23/2012   CL 97 08/19/2012   CL 96 08/19/2012   CL 95*  05/23/2012   CO2 32 08/19/2012   CO2 28 05/23/2012   CO2 27 05/22/2012   GLUCOSE 107* 08/19/2012   GLUCOSE 104* 08/19/2012   GLUCOSE 89 05/23/2012   BUN 25* 08/19/2012   BUN 24* 08/19/2012   BUN 14 05/23/2012   CREATININE 0.63 08/20/2012   CREATININE 0.90 08/19/2012   CREATININE 0.79 08/19/2012   CALCIUM 9.8 08/19/2012   CALCIUM 9.4 05/23/2012   CALCIUM 9.1 05/22/2012   LFT  Recent Labs  08/19/12 1727  PROT 7.3  ALBUMIN 3.8  AST 26  ALT 7  ALKPHOS 133*  BILITOT 0.4  albumin       3.8  PT/INR Lab Results  Component Value Date   INR 1.24 05/19/2012     RADIOLOGY STUDIES: US Abdomen Complete 08/20/2012    IMPRESSION:  1.  Diffusely thick-walled gallbladder, with the wall measuring up to 0.7 cm in thickness.  No pericholecystic fluid seen; no stones identified.  No ultrasonographic Murphy's sign elicited.  Findings are nonspecific, and could reflect a variety of conditions, such as hypoalbuminemia or congestive right heart failure. 2.  Diffuse dilatation of the common hepatic duct, of uncertain significance.  Intrahepatic biliary ductal dilatation at the left hepatic lobe is not well characterized on ultrasound.  Findings on CT raise concern for distal obstruction, not well assessed on this study.  As suggested on CT, MRCP could be considered for further evaluation, when and as deemed clinically appropriate (when and if the patient can tolerate holding her breath for the study). 3.  Diffuse fatty infiltration within the liver.  Small hepatic cyst seen. 4.  Splenomegaly again noted. 5.  Large bilateral renal cysts seen. 6.  Diffusely increased renal parenchymal echogenicity noted bilaterally, raising question for medical renal disease. 7.  Scattered calcific atherosclerotic disease noted along the visualized abdominal aorta.   Original Report Authenticated By: Tonia Ghent, M.D.    Ct Abdomen Pelvis W Contrast 08/19/2012    IMPRESSION:  1.  Question of mild colonic wall thickening along the sigmoid colon,  which could reflect a mild infectious or inflammatory process.  Alternatively, this could simply reflect decompression; the colon is difficult to fully assess. 2.  Diffuse wall thickening and a small amount of pericholecystic fluid with respect to the gallbladder, new from the prior study. The gallbladder lumen is relatively contracted.  This is nonspecific, but raises question for cholecystitis.  Would correlate for associated symptoms. 3.  Note again made of marked prominence of the biliary ducts within the left hepatic lobe, with marked distension of the common hepatic duct to 1.8 cm in diameter.  As before, this raises concern for underlying obstructive process, suggest cholangiocarcinoma; MRCP could be considered for further evaluation, when and as deemed clinically appropriate (when and if the patient can tolerate holding her breath for the MRI study)(Note: She had the MRI/MRCP in 04/2012) 4.  Increased interstitial prominence of the lung bases could reflect mild interstitial edema. 5.  Splenomegaly noted. 6.  Underlying periportal edema seen. 7.  Numerous large bilateral renal cysts again seen, with distortion of the underlying renal parenchyma. 8.  Extensive diffuse calcification along the abdominal aorta and its branches, including along the proximal renal arteries bilaterally.  Likely moderate to severe stenosis along the common and external iliac  arteries bilaterally, due to calcific atherosclerotic disease.   Original Report Authenticated By: Tonia Ghent, M.D.    MRCP/MR abdomen 04/24/12 IMPRESSION:  1. Left hepatic lobe atrophy and dilated tortuous irregular beaded  bile ducts likely the sequela of prior  inflammation/infection/cholangitis.  2. Mild dilatation of the right intrahepatic biliary tree but no  inflammatory changes.  3. Common bile duct dilatation but no common bile duct stones or  evidence of pancreatic head mass or ampullary lesion. This could  be normal for age or could be  due to a distal stricture.  Correlation with liver function studies is recommended.  4. Numerous bilateral renal cysts.    ENDOSCOPIC STUDIES: 03/2012   EGD  Normal to D2.   IMPRESSION: *  Chronic nausea and abdominal pain for many years. Diagnosed with IBS and chronic abdominal pain by MDs in [place].  CT and MRI fall 2013 suggesting prior inflamatory process. But LFTs consistently normal Normal EGD in 03/2012   Now with contracted GB, thickened GB wall and pericholecystic fluid. Again LFTs normal.  *  Diarrhea with possible sigmoid colitis on CT scan.  This could be infectious (viral vs bacterial) or ischemic.  This has resolved     PLAN *  Await HIDA scan to assess GB function.  *   D/C contact precautions along with stool studies as diarrhea is resolved.  *  Will D/C both scheduled Questran and Miralax, the daily combination are at odds with each other.  Will make Miralax PRN, continue Lactinex.     LOS: 1 day   Jennye Moccasin  08/20/2012, 4:34 PM Pager: [phone]  Pt likely has chronic cholecystitis as evidenced by thickened GB wall although similar findings can be seen with CHF and hypoalbumenemia.  There is a small amount of pericholecystic fluid be CT which raises the question of acute cholecystitis.  It is difficult to correlate clinically.  Findings of colonic wall thickening are soft.  Biliary tract changes are old, and, in the absence of LFT abnormalities, unlikely causing symptoms or obstruction.  Diarrhea appears to be subsiding.  Recommend - proceed with HIDA scan.  Hold further GI studies.

## 2012-08-20 NOTE — Progress Notes (Signed)
INITIAL NUTRITION ASSESSMENT  DOCUMENTATION CODES Per approved criteria  -Underweight   INTERVENTION: 1. Resource Breeze po TID, each supplement provides 250 kcal and 9 grams of protein.  2. Change to Ensure Complete once diet is able to advance  3. RD will continue to follow    NUTRITION DIAGNOSIS: Underweight related to increased needs as evidenced by Body mass index is 15.51 kg/(m^2). [place] Kitchen   Goal: PO intake to support weight gain  Monitor:  PO intake, weight trends, labs, I/O's  Reason for Assessment: Health History   79 y.o. female  Admitting Dx:   ASSESSMENT: Pt admitted with diarrhea and weakness.  RD pulled to pt for low BMI, problem list includes weight loss.  Per pt weight hx, pt weight has been stable, 102 lbs appears to be her usual weight. Pt is unsure of what she normally weighs. Endorses weight loss in the past, unsure of time frame or amount lost. Pt states she eats well at her ALF, like the food. Requesting breakfast at this time, RD explained clear liquid diet but pt continued to ask for breakfast. Does drink Ensure on occasion. Currently on Clears, will add resource breeze, when diet is advanced can be changed to Ensure.  Pt likely with some level of malnutrition given chronic underweight status.   Height: Ht Readings from Last 1 Encounters:  08/20/12 5\' 8"  (1.727 m)    Weight: Wt Readings from Last 1 Encounters:  08/20/12 102 lb (46.267 kg)    Ideal Body Weight: 140 lbs   % Ideal Body Weight: 73%  Wt Readings from Last 10 Encounters:  08/20/12 102 lb (46.267 kg)  05/19/12 97 lb 14.2 oz (44.4 kg)  04/01/12 102 lb (46.267 kg)  03/27/12 104 lb (47.174 kg)  03/26/12 102 lb (46.267 kg)  03/25/12 102 lb 6.4 oz (46.448 kg)    Usual Body Weight: 102 lbs   % Usual Body Weight: 100%  BMI:  Body mass index is 15.51 kg/(m^2). Underweight   Estimated Nutritional Needs: Kcal: 1300-1500 Protein: 45-55 gm  Fluid: >/= 1.5 L/day  Skin: intact   Diet  Order: Clear Liquid  EDUCATION NEEDS: -No education needs identified at this time   Intake/Output Summary (Last 24 hours) at 08/20/12 0958 Last data filed at 08/20/12 0600  Gross per 24 hour  Intake   1000 ml  Output    650 ml  Net    350 ml    Last BM: PTA    Labs:   Recent Labs Lab 08/19/12 1727 08/19/12 1751 08/20/12 0648  NA 136 140  --   K 4.5 4.4  --   CL 96 97  --   CO2 32  --   --   BUN 24* 25*  --   CREATININE 0.79 0.90 0.63  CALCIUM 9.8  --   --   GLUCOSE 104* 107*  --     CBG (last 3)   Recent Labs  08/19/12 1703  GLUCAP 81    Scheduled Meds: . acidophilus  1 capsule Oral BID  . ALPRAZolam  0.25 mg Oral BID  . amLODipine  10 mg Oral Q breakfast  . aspirin  81 mg Oral Q breakfast  . cholestyramine light  8 g Oral Q lunch  . digoxin  0.125 mg Oral Q breakfast  . DULoxetine  60 mg Oral Daily  . enoxaparin (LOVENOX) injection  40 mg Subcutaneous Q24H  . feeding supplement  237 mL Oral BID AC  . gabapentin  300 mg Oral BID  . hydroxyurea  500 mg Oral BID  . isosorbide mononitrate  60 mg Oral Q breakfast  . memantine  5 mg Oral BID  . metoprolol tartrate  25 mg Oral Q breakfast  . mirtazapine  7.5 mg Oral QHS  . morphine  45 mg Oral TID  . pantoprazole  40 mg Oral Daily  . polyethylene glycol  17 g Oral Daily  . silver sulfADIAZINE  1 application Topical Q48H    Continuous Infusions: . sodium chloride 1,000 mL (08/19/12 1923)  . dextrose 5 % and 0.9 % NaCl with KCl 20 mEq/L 100 mL/hr at 08/20/12 1610    Past Medical History  Diagnosis Date  . Vascular abnormality   . DVT (deep venous thrombosis)   . Alcoholism   . Depression   . Hypertension   . UTI (lower urinary tract infection)   . Chronic back pain   . Arthritis     all over  . Renal disorder   . Heart disease   . Hyperlipidemia   . Phlebitis   . UTI (urinary tract infection)     Past Surgical History  Procedure Laterality Date  . Thrombectomy    . Shoulder surgery     . Hand surgery    . Appendectomy    . Coronary angioplasty with stent placement    . Spine surgery      x 3    Clarene Duke RD, LDN Pager [phone] After Hours pager [phone]

## 2012-08-21 ENCOUNTER — Inpatient Hospital Stay (HOSPITAL_COMMUNITY): Payer: Medicare Other

## 2012-08-21 DIAGNOSIS — K838 Other specified diseases of biliary tract: Secondary | ICD-10-CM

## 2012-08-21 DIAGNOSIS — R932 Abnormal findings on diagnostic imaging of liver and biliary tract: Secondary | ICD-10-CM

## 2012-08-21 DIAGNOSIS — I4891 Unspecified atrial fibrillation: Secondary | ICD-10-CM

## 2012-08-21 LAB — COMPREHENSIVE METABOLIC PANEL
ALT: 6 U/L (ref 0–35)
AST: 21 U/L (ref 0–37)
Albumin: 3.3 g/dL — ABNORMAL LOW (ref 3.5–5.2)
CO2: 24 mEq/L (ref 19–32)
Calcium: 9.7 mg/dL (ref 8.4–10.5)
GFR calc non Af Amer: 77 mL/min — ABNORMAL LOW (ref >90)
Sodium: 138 mEq/L (ref 135–145)
Total Protein: 6.5 g/dL (ref 6.0–8.3)

## 2012-08-21 LAB — CBC
MCH: 31.7 pg (ref 26.0–34.0)
Platelets: 1240 10*3/uL (ref 150–400)
RBC: 3.75 MIL/uL — ABNORMAL LOW (ref 3.87–5.11)

## 2012-08-21 MED ORDER — MORPHINE SULFATE 2 MG/ML IJ SOLN
2.0000 mg | Freq: Once | INTRAMUSCULAR | Status: AC
  Administered 2012-08-21: 2 mg via INTRAVENOUS

## 2012-08-21 MED ORDER — POLYETHYLENE GLYCOL 3350 17 G PO PACK
17.0000 g | PACK | Freq: Every day | ORAL | Status: DC | PRN
  Filled 2012-08-21: qty 1

## 2012-08-21 MED ORDER — DIGOXIN 125 MCG PO TABS
0.1250 mg | ORAL_TABLET | Freq: Every day | ORAL | Status: DC
  Administered 2012-08-22 – 2012-08-28 (×7): 0.125 mg via ORAL
  Filled 2012-08-21 (×8): qty 1

## 2012-08-21 MED ORDER — TECHNETIUM TC 99M MEBROFENIN IV KIT
5.0000 | PACK | Freq: Once | INTRAVENOUS | Status: AC | PRN
  Administered 2012-08-21: 5 via INTRAVENOUS

## 2012-08-21 NOTE — Progress Notes (Addendum)
Pt heart rate sustaining in the 30's-40's.  Dropped as low as 29 this am.  Pt also having pauses, greatest at 2.74 secs.  Pt denies any dizziness or lightheadedness but is somewhat lethargic, responding appropriately to commands.  BP 143/71 at this time.  MD has been made aware.  New orders given.  Will continue to monitor. Nino Glow RN

## 2012-08-21 NOTE — Progress Notes (Signed)
[facility] PROGRESS NOTE  Tamara Turner ZOX:[id number] DOB: Nov 05, 1921 DOA: 08/19/2012 PCP: Florentina Jenny, MD  Brief narrative Tamara Turner is an 79 y.o. female with hx of PVD, DVT, HTN, depression, and right lateral ankle infection recently on antibiotic, presents to the ER with abdominal pain and diarrhea. She said her diarrhea has been for three days. She felt weak. Evaluation in the ER showed platelet count of 1.8 millions, with normal other indices. She also had a CT scan of the abdomen, which showed intrahepatic dilatation to 1.8 cm, and pericolic fluid, suggestive of cholecystitis or obtructive process as in cholangiocarcinoma. She also has segment of colonic wall thickening which could represents inflammatory process vs infectious process. Hospitalist was asked to admit this patients for the above reasons.   Assessment/Plan: 1. Diarrhea: Acute viral gastroenteritis versus rule out C. difficile. Seems to have resolved. 2. Thrombocytosis, ?chronic: EDP discussed with Dr. Arline Asp on admission, who recommended checking JAK-2 and outpatient followup in his office in a week's time with repeat CBC. He also recommended aspirin but no hydroxyurea at this time. Platelet count slightly better. 3. Abdominal pain, abnormal imaging (ultrasound abdomen and CT abdomen):? Mild colonic wall thickening along the sigmoid colon, gallbladder wall thickening, distended, hepatic duct and splenomegaly. Abdominal pain has resolved. [facility] consultation appreciated-please see note. They are requesting HIDA scan. Await further recommendations. 4. HTN: Controlled. 5. History of A. fib /bradycardia: Patient placed on telemetry 3/5 afternoon. Early this morning and even overnight, episodic sustained bradycardia in the 40s and sometimes 30s but asymptomatic. Patient did receive digoxin 0.125 and Toprol-XL 25 mg this morning. For now, discontinue metoprolol and placed holding parameters on digoxin. Continue to  monitor on telemetry. Patient on digoxin- level normal. Will place on telemetry. Continue aspirin for stroke prophylaxis--has not been on anticoagulants prior to admission. 6. Chronic low back pain/on chronic opioids: 7. History of depression and possible anxiety: Continue home medications   Code Status: DO NOT RESUSCITATE Family Communication: Discussed with patient Disposition Plan: Home when medically stable.   Consultants:  EDP discussed with Dr. Arline Asp, Oncology  [facility]  Procedures:  None  Antibiotics:  None   HPI/Subjective: Denies abdominal pain or diarrhea. Indicates that she is on chronic opioids for chronic low back pain.  Objective: Filed Vitals:   08/20/12 1400 08/20/12 2100 08/21/12 0504 08/21/12 1128  BP: 124/63 140/73 142/61 163/63  Pulse: 55 63 66 45  Temp: 97.1 F (36.2 C) 97.8 F (36.6 C) 97 F (36.1 C) 97.8 F (36.6 C)  TempSrc: Oral Oral Oral Oral  Resp: 18 20 18 18   Height:      Weight:  47 kg (103 lb 9.9 oz) 46.5 kg (102 lb 8.2 oz)   SpO2: 99% 98% 98% 95%    Intake/Output Summary (Last 24 hours) at 08/21/12 1405 Last data filed at 08/21/12 1300  Gross per 24 hour  Intake 2641.66 ml  Output   1000 ml  Net 1641.66 ml   Filed Weights   08/20/12 0545 08/20/12 2100 08/21/12 0504  Weight: 46.267 kg (102 lb) 47 kg (103 lb 9.9 oz) 46.5 kg (102 lb 8.2 oz)    Exam:   General exam: Comfortable.  Respiratory system: Clear. No increased work of breathing.  Cardiovascular system: S1 & S2 heard, RRR. No JVD, murmurs, gallops, clicks or pedal edema.  Gastrointestinal system: Abdomen is nondistended, soft and nontender. Normal bowel sounds heard. Telemetry shows atrial fibrillation with ventricular rate currently in the 50s. However this morning  she is had periods of sustained rates in the 40s and sometimes in the 30s albeit asymptomatic.  Central nervous system: Alert and oriented. No focal neurological  deficits.  Extremities: Symmetric 5 x 5 power.   Data Reviewed: Basic Metabolic Panel:  Recent Labs Lab 08/19/12 1727 08/19/12 1751 08/20/12 0648 08/21/12 0540  NA 136 140  --  138  K 4.5 4.4  --  4.5  CL 96 97  --  104  CO2 32  --   --  24  GLUCOSE 104* 107*  --  108*  BUN 24* 25*  --  15  CREATININE 0.79 0.90 0.63 0.65  CALCIUM 9.8  --   --  9.7   Liver Function Tests:  Recent Labs Lab 08/19/12 1727 08/21/12 0540  AST 26 21  ALT 7 6  ALKPHOS 133* 93  BILITOT 0.4 0.3  PROT 7.3 6.5  ALBUMIN 3.8 3.3*    Recent Labs Lab 08/19/12 1727  LIPASE 23   No results found for this basename: AMMONIA,  in the last 168 hours CBC:  Recent Labs Lab 08/19/12 1727 08/19/12 1751 08/19/12 1913 08/20/12 0648 08/21/12 0540  WBC 12.0*  --  11.9* 12.1* 17.5*  HGB 12.2 13.9 11.4* 12.0 11.9*  HCT 38.0 41.0 35.5* 36.7 36.7  MCV 97.9  --  99.7 96.3 97.9  PLT 1892*  --  1797* 1627* 1240*   Cardiac Enzymes:  Recent Labs Lab 08/19/12 1727  TROPONINI <0.30   BNP (last 3 results) No results found for this basename: PROBNP,  in the last 8760 hours CBG:  Recent Labs Lab 08/19/12 1703  GLUCAP 81    Recent Results (from the past 240 hour(s))  MRSA PCR SCREENING     Status: None   Collection Time    08/20/12  6:09 AM      Result Value Range Status   MRSA by PCR NEGATIVE  NEGATIVE Final   Comment:            The GeneXpert MRSA Assay (FDA     approved for NASAL specimens     only), is one component of a     comprehensive MRSA colonization     surveillance program. It is not     intended to diagnose MRSA     infection nor to guide or     monitor treatment for     MRSA infections.     Studies: US Abdomen Complete  08/20/2012  *RADIOLOGY REPORT*  Clinical Data:  Right upper quadrant abdominal pain.  ABDOMINAL ULTRASOUND COMPLETE  Comparison:  CT of the abdomen and pelvis performed earlier today at 09:16 p.m.  Findings:  Gallbladder: The gallbladder is diffusely  thick-walled, with the wall measuring up to 0.7 cm in thickness.  No pericholecystic fluid is seen.  This is nonspecific, and could reflect a variety of conditions, such as hypoalbuminemia or congestive right heart failure.  No stones are identified; no ultrasonographic Murphy's sign is elicited.  Common Bile Duct:  1.3 cm in diameter; diffusely dilated, of uncertain significance.  Liver: Diffusely increased hepatic echogenicity and coarsened echotexture, compatible with fatty infiltration.  Intrahepatic biliary ductal dilatation at the left hepatic lobe is not well characterized on ultrasound.  A 1.2 cm cyst is noted within the inferior aspect of the right hepatic lobe.  Limited Doppler evaluation demonstrates normal blood flow within the liver.  IVC:  Unremarkable in appearance.  Pancreas:  Although the pancreas is difficult to visualize in its entirety  due to overlying bowel gas, no focal pancreatic abnormality is identified.  Spleen:  14.4 cm in length; increased in size, with increased echogenicity.  Right kidney:  9.8 cm in length; grossly normal in size and configuration.  Diffusely increased parenchymal echogenicity is noted.  No evidence of hydronephrosis.  Multiple large right-sided renal cysts are seen, measuring up to 9.0 cm in size.  Left kidney:  11.1 cm in length; grossly normal in size and configuration.  Diffusely increased parenchymal echogenicity is noted.  No evidence of hydronephrosis. Multiple large left-sided renal cysts are seen, measuring up to 5.9 cm in size.  Abdominal Aorta:  Normal in caliber; no aneurysm identified. Scattered calcific atherosclerotic disease is noted along the visualized abdominal aorta.  IMPRESSION:  1.  Diffusely thick-walled gallbladder, with the wall measuring up to 0.7 cm in thickness.  No pericholecystic fluid seen; no stones identified.  No ultrasonographic Murphy's sign elicited.  Findings are nonspecific, and could reflect a variety of conditions, such as  hypoalbuminemia or congestive right heart failure. 2.  Diffuse dilatation of the common hepatic duct, of uncertain significance.  Intrahepatic biliary ductal dilatation at the left hepatic lobe is not well characterized on ultrasound.  Findings on CT raise concern for distal obstruction, not well assessed on this study.  As suggested on CT, MRCP could be considered for further evaluation, when and as deemed clinically appropriate (when and if the patient can tolerate holding her breath for the study). 3.  Diffuse fatty infiltration within the liver.  Small hepatic cyst seen. 4.  Splenomegaly again noted. 5.  Large bilateral renal cysts seen. 6.  Diffusely increased renal parenchymal echogenicity noted bilaterally, raising question for medical renal disease. 7.  Scattered calcific atherosclerotic disease noted along the visualized abdominal aorta.   Original Report Authenticated By: Tonia Ghent, M.D.    Ct Abdomen Pelvis W Contrast  08/19/2012  *RADIOLOGY REPORT*  Clinical Data: Diarrhea.  CT ABDOMEN AND PELVIS WITH CONTRAST  Technique:  Multidetector CT imaging of the abdomen and pelvis was performed following the standard protocol during bolus administration of intravenous contrast.  Contrast: 80mL OMNIPAQUE IOHEXOL 300 MG/ML  SOLN  Comparison: CT of the abdomen and pelvis performed 03/28/2012  Findings: Increased interstitial prominence at the lung bases could reflect mild interstitial edema.  Note is again made of marked prominence of the biliary ducts within the left hepatic lobe, with marked distension of the common hepatic duct, measuring 1.8 cm in diameter.  This again raises concern for an underlying obstructive process, such as cholangiocarcinoma; as before, MRCP could be considered for further evaluation, when and as deemed clinically appropriate.  Underlying periportal edema is noted.  There is diffuse wall thickening and a small amount of pericholecystic fluid with respect to the gallbladder; the  gallbladder lumen is relatively contracted. This is nonspecific, but raises question for cholecystitis.  Would correlate for associated symptoms.  The liver is otherwise grossly unremarkable in appearance.  The spleen is enlarged, measuring 14.2 cm, and appears somewhat bulky.  The pancreas and adrenal glands are unremarkable.  Numerous large bilateral renal cysts are again seen, measuring up to 8.2 cm in size.  There is associated distortion of the renal parenchyma bilaterally.  Mild nonspecific perinephric stranding is seen.  There is no evidence of hydronephrosis.  No renal or ureteral stones are identified.  No free fluid is identified.  The small bowel is unremarkable in appearance.  The stomach is within normal limits.  No acute vascular abnormalities are  seen.  Extensive diffuse calcification is noted along the abdominal aorta and its branches, including along the proximal renal arteries bilaterally.  There is likely moderate to severe stenosis along the common and external iliac arteries bilaterally, due to extensive calcific atherosclerotic disease.  The patient is status post appendectomy.  The colon is difficult to fully characterize due to surrounding bowel loops, but there may be mild colonic wall thickening along the sigmoid colon, which could reflect a mild infectious or inflammatory process.  The bladder is mildly distended and grossly unremarkable in appearance.  The uterus is not well characterized.  No suspicious adnexal masses are seen.  No inguinal lymphadenopathy is seen.  No acute osseous abnormalities are identified.  IMPRESSION:  1.  Question of mild colonic wall thickening along the sigmoid colon, which could reflect a mild infectious or inflammatory process.  Alternatively, this could simply reflect decompression; the colon is difficult to fully assess. 2.  Diffuse wall thickening and a small amount of pericholecystic fluid with respect to the gallbladder, new from the prior study. The  gallbladder lumen is relatively contracted.  This is nonspecific, but raises question for cholecystitis.  Would correlate for associated symptoms. 3.  Note again made of marked prominence of the biliary ducts within the left hepatic lobe, with marked distension of the common hepatic duct to 1.8 cm in diameter.  As before, this raises concern for underlying obstructive process, suggest cholangiocarcinoma; MRCP could be considered for further evaluation, when and as deemed clinically appropriate (when and if the patient can tolerate holding her breath for the MRI study). 4.  Increased interstitial prominence of the lung bases could reflect mild interstitial edema. 5.  Splenomegaly noted. 6.  Underlying periportal edema seen. 7.  Numerous large bilateral renal cysts again seen, with distortion of the underlying renal parenchyma. 8.  Extensive diffuse calcification along the abdominal aorta and its branches, including along the proximal renal arteries bilaterally.  Likely moderate to severe stenosis along the common and external iliac arteries bilaterally, due to calcific atherosclerotic disease.   Original Report Authenticated By: Tonia Ghent, M.D.      Additional labs:   Scheduled Meds: . acidophilus  1 capsule Oral BID  . ALPRAZolam  0.25 mg Oral BID  . amLODipine  10 mg Oral Q breakfast  . aspirin  81 mg Oral Q breakfast  . [START ON 08/22/2012] digoxin  0.125 mg Oral Q breakfast  . DULoxetine  60 mg Oral Daily  . enoxaparin (LOVENOX) injection  40 mg Subcutaneous Q24H  . feeding supplement  1 Container Oral TID BM  . gabapentin  300 mg Oral BID  . isosorbide mononitrate  60 mg Oral Q breakfast  . memantine  5 mg Oral BID  . mirtazapine  7.5 mg Oral QHS  . morphine  45 mg Oral TID  . pantoprazole  40 mg Oral Daily  . silver sulfADIAZINE  1 application Topical Q48H   Continuous Infusions: . dextrose 5 % and 0.9 % NaCl with KCl 20 mEq/L 100 mL/hr at 08/21/12 1610    Active Problems:    Weight loss   Thrombocytosis   Diarrhea   Dilated intrahepatic bile duct   Dehydration, mild   Nonspecific (abnormal) findings on radiological and other examination of biliary tract    Time spent: 40 minutes    [facility]  [facility] Pager [phone].   If 8PM-8AM, please contact night-coverage at [email or web address], password [email or web address] 08/21/2012, 2:05 PM  LOS: 2 days

## 2012-08-22 ENCOUNTER — Encounter (HOSPITAL_COMMUNITY): Payer: Self-pay | Admitting: Sports Medicine

## 2012-08-22 DIAGNOSIS — R109 Unspecified abdominal pain: Secondary | ICD-10-CM

## 2012-08-22 DIAGNOSIS — K810 Acute cholecystitis: Secondary | ICD-10-CM

## 2012-08-22 MED ORDER — ENOXAPARIN SODIUM 30 MG/0.3ML ~~LOC~~ SOLN
30.0000 mg | SUBCUTANEOUS | Status: DC
  Administered 2012-08-23 – 2012-08-27 (×5): 30 mg via SUBCUTANEOUS
  Filled 2012-08-22 (×6): qty 0.3

## 2012-08-22 MED ORDER — CIPROFLOXACIN IN D5W 400 MG/200ML IV SOLN
400.0000 mg | Freq: Two times a day (BID) | INTRAVENOUS | Status: DC
  Administered 2012-08-22 – 2012-08-28 (×12): 400 mg via INTRAVENOUS
  Filled 2012-08-22 (×14): qty 200

## 2012-08-22 MED ORDER — METRONIDAZOLE IN NACL 5-0.79 MG/ML-% IV SOLN
500.0000 mg | Freq: Three times a day (TID) | INTRAVENOUS | Status: DC
  Administered 2012-08-22 – 2012-08-28 (×18): 500 mg via INTRAVENOUS
  Filled 2012-08-22 (×21): qty 100

## 2012-08-22 NOTE — Progress Notes (Signed)
     [facility] Daily Rounding Note 08/22/2012, 9:49 AM  SUBJECTIVE:       No diarrhea or abd pain.  No nausea.  Hungry  OBJECTIVE:         Vital signs in last 24 hours:    Temp:  [97.4 F (36.3 C)-98.1 F (36.7 C)] 97.4 F (36.3 C) (03/07 0553) Pulse Rate:  [45-77] 54 (03/07 0553) Resp:  [16-18] 16 (03/07 0553) BP: (152-164)/(50-81) 152/50 mmHg (03/07 0553) SpO2:  [92 %-95 %] 94 % (03/07 0553) Weight:  [45.995 kg (101 lb 6.4 oz)] 45.995 kg (101 lb 6.4 oz) (03/07 0553) Last BM Date: 08/20/12 General: aged but not ill appearing   Heart: Irregular, slow Chest: clear.  No resp distress Abdomen: soft, NT, ND  Extremities: no CCE Neuro/Psych:  Pleasant, appropriate, fully alert.   Intake/Output from previous day: 03/06 0701 - 03/07 0700 In: 0  Out: 600 [Urine:600]  Intake/Output this shift:    Lab Results:  Recent Labs  08/19/12 1913 08/20/12 0648 08/21/12 0540  WBC 11.9* 12.1* 17.5*  HGB 11.4* 12.0 11.9*  HCT 35.5* 36.7 36.7  PLT 1797* 1627* 1240*   BMET  Recent Labs  08/19/12 1727 08/19/12 1751 08/20/12 0648 08/21/12 0540  NA 136 140  --  138  K 4.5 4.4  --  4.5  CL 96 97  --  104  CO2 32  --   --  24  GLUCOSE 104* 107*  --  108*  BUN 24* 25*  --  15  CREATININE 0.79 0.90 0.63 0.65  CALCIUM 9.8  --   --  9.7   LFT  Recent Labs  08/19/12 1727 08/21/12 0540  PROT 7.3 6.5  ALBUMIN 3.8 3.3*  AST 26 21  ALT 7 6  ALKPHOS 133* 93  BILITOT 0.4 0.3   Studies/Results: Nm Hepatobiliary 08/21/2012    Findings: There is uniform uptake of radiotracer within the liver. Counts are evident within the small bowel by 15 minutes.  The gallbladder was not demonstrated by 60 minutes.  1.85 mg of IV morphine was administered.  The patient was imaged for additional 25 minutes without visualization of the gallbladder.  The patient was in extreme pain and patient refused further imaging. Typically the patient's image 30-45 minutes post morphine.  IMPRESSION: 1.   Nonvisualization of the gallbladder following morphine injection is most consistent with acute cholecystitis. 2.  Patent common bile duct.  These results will be called to the ordering clinician or representative by the Radiologist Assistant, and communication documented in the PACS Dashboard.   Original Report Authenticated By: Genevive Bi, M.D.     ASSESMENT: *  Chronic abd pain.  Acute flare PTA with new changes in GB.  HIDA scan + for cholecystitis.  Not sure she is operative candidate but will have gen surg weigh in.  *  Asymptomatic bradycardia.    PLAN: *  Spoke with Dr Waymon Amato, will call gen surgery.   LOS: 3 days   Jennye Moccasin  08/22/2012, 9:49 AM Pager: [phone]  I have personally taken an interval history, reviewed the chart, and examined the patient.  I agree with the extender's note, impression and recommendations. Pt has acute cholecystitis as evidenced by nonviz GB on HIDA scan.  I favor percutaneous cystostomy tube in view of overall medical condition.     Barbette Hair. Arlyce Dice, MD, [facility] [phone]

## 2012-08-22 NOTE — Progress Notes (Signed)
Patient interviewed and examined and all imaging and chart reviewed with the resident physician. This is not a clear-cut clinical picture. She has chronic abdominal pain and she really cannot localize pain to her right upper quadrant. Her abdomen is not significantly tender without any particular gallbladder tenderness. She has a thickened gallbladder wall but no gallstones. HIDA does not fill the gallbladder but this test can have a significant false positive results in patients with other illness. She could have multiple other etiologies for her thickened gallbladder wall. She also would be at increased operative risk at her age and multiple comorbidities and she is a DO NOT RESUSCITATE nursing home resident. With all these concerns I believe that surgical intervention would have more risk than benefit. I would favor percutaneous drainage of her gallbladder to see if we see evidence of acute cholecystitis and to see if this would improve her clinical course.

## 2012-08-22 NOTE — Progress Notes (Signed)
[facility] Consult Note  Patient name: Tamara Turner Medical record number: [id number] Date of birth: Feb 26, 1922 Age: 79 y.o. Gender: female  Primary Care Provider: Florentina Jenny, MD Attending Physician: Elease Etienne, MD Consultants: GI  CODE STATUS: DNR  CC  Abdominal Pain  HPI  Tamara Turner is a 79 y.o. year old female who presented on 08/20/12 with Abdominal Pain and Diarrhea for 3 days.  She was found to have a significant thrombocytosis that appears chronic, CT and US of the abdomen revealed mild colonic wall thickening along the sigmoid colon, gallbladder wall thickening of 0.7cm, distended common hepatic duct to 1.8cm, 1.2cm cyst in inferior aspect of R hepatic lobe and splenomegaly.  [place] Also noted   GI was initially consulted and HIDA scan was obtained that showed non-visualization of the gallbladder.  General surgery consulted for consideration of choelcystectomy.    She reports intermittent diarrhea and RUQ abdominal pain with eating for the past 3 days.  No prior episodes similar to this.  Good appetite currently.  Medical hx is significant for CAD s/p PCI, HLD, Heart Disease, A. Fib, DVT and marked thrombocytosis.  Past abdominal surgeries include, appendectomy, and Exploratory Laparotomy with low midline incision.   ROS   Constitutional Fatigue, malaise, and generalized deconditioning requiring walker to ambulate.  Infectious No fevers, + chills  Resp No cough, no congestion  Cardiac Intermittent chest pain, non since hospitalization, occasional orthopnea, significant dyspnea on exertion.   GI + abdominal pain as above, diarrhea now resolved, no n/v  GU No dysuria, or frequency  Skin No noted skin breakdown    HISTORY  PMHx:  Past Medical History  Diagnosis Date  . Vascular abnormality   . DVT (deep venous thrombosis)   . Alcoholism   . Depression   . Hypertension   . UTI (lower urinary tract infection)   . Chronic back pain   . Arthritis      all over  . Renal disorder   . Heart disease   . Hyperlipidemia   . Phlebitis   . UTI (urinary tract infection)   . CAD (coronary artery disease)     PSHx: Past Surgical History  Procedure Laterality Date  . Thrombectomy      R leg  . Shoulder surgery    . Hand surgery    . Appendectomy    . Coronary angioplasty with stent placement    . Spine surgery      x 3  . Exploratory laparotomy      associated with "trying to get pregnant" - low midline scar    Social Hx: History   Social History  . Marital Status: Widowed    Spouse Name: N/A    Number of Children: 0  . Years of Education: N/A   Occupational History  . Retired     Social History Main Topics  . Smoking status: Current Every Day Smoker -- 0.25 packs/day    Types: Cigarettes  . Smokeless tobacco: Never Used  . Alcohol Use: No  . Drug Use: No  . Sexually Active: None   Other Topics Concern  . None   Social History Narrative   Widowed, moved to [place] to assisted living in August 2013 after many years in [place]    Daily caffeine 3 drinks daily   Next of kin is her niece Janeece Agee    Family Hx: Family History  Problem Relation Age of Onset  . Colon cancer Neg Hx   .  Pancreatic cancer Sister     Allergies: Allergies  Allergen Reactions  . Penicillins Hives  . Sulfa Antibiotics Nausea Only    Home Medications: Prescriptions prior to admission  Medication Sig Dispense Refill  . ALPRAZolam (XANAX) 0.25 MG tablet Take 0.25 mg by mouth 2 (two) times daily.      Marland Kitchen alum & mag hydroxide-simeth (MI-ACID) 200-200-20 MG/5ML suspension Take 15 mLs by mouth 3 (three) times daily.      Marland Kitchen amLODipine (NORVASC) 10 MG tablet Take 10 mg by mouth daily with breakfast.       . aspirin 81 MG chewable tablet Chew 81 mg by mouth daily with breakfast.      . calcium-vitamin D (OSCAL WITH D) 500-200 MG-UNIT per tablet Take 1 tablet by mouth daily with breakfast.      . cholestyramine light  (PREVALITE) 4 G packet Take 8 g by mouth daily before breakfast.       . digoxin (LANOXIN) 0.125 MG tablet Take 0.125 mg by mouth daily with breakfast.       . DULoxetine (CYMBALTA) 60 MG capsule Take 1 capsule (60 mg total) by mouth daily.  30 capsule  0  . feeding supplement (ENSURE IMMUNE HEALTH) LIQD Take 237 mLs by mouth 2 (two) times daily before lunch and supper.      . gabapentin (NEURONTIN) 300 MG capsule Take 300 mg by mouth 2 (two) times daily.       Marland Kitchen HYDROcodone-acetaminophen (NORCO/VICODIN) 5-325 MG per tablet Take 1 tablet by mouth every 4 (four) hours as needed for pain. Pain  30 tablet  0  . isosorbide mononitrate (IMDUR) 60 MG 24 hr tablet Take 60 mg by mouth daily with breakfast.       . lactobacillus (FLORANEX/LACTINEX) PACK Take 1 g by mouth 2 (two) times daily.      . lansoprazole (PREVACID) 30 MG capsule Take 30 mg by mouth daily with breakfast.      . loperamide (IMODIUM) 2 MG capsule Take 2-4 mg by mouth as needed. Take 2 caps by mouth after first loose stool then 1 cap by mouth after each subsequent      . memantine (NAMENDA) 5 MG tablet Take 5 mg by mouth 2 (two) times daily.      . metoprolol tartrate (LOPRESSOR) 25 MG tablet Take 25 mg by mouth daily with breakfast.       . mirtazapine (REMERON) 15 MG tablet Take 7.5 mg by mouth at bedtime.      Marland Kitchen morphine (MS CONTIN) 15 MG 12 hr tablet Take 45 mg by mouth 3 (three) times daily.      . Multiple Vitamins-Minerals (PRESERVISION AREDS PO) Take 1 capsule by mouth 2 (two) times daily.      . polyethylene glycol (MIRALAX / GLYCOLAX) packet Take 17 g by mouth daily.       . silver sulfADIAZINE (SILVADENE) 1 % cream Apply 1 application topically every other day. With wound cleaning procedure      . spironolactone (ALDACTONE) 25 MG tablet Take 25 mg by mouth daily with breakfast.         OBJECTIVE  Vitals: Temp:  [97.4 F (36.3 C)-98.1 F (36.7 C)] 97.4 F (36.3 C) (03/07 0553) Pulse Rate:  [54-77] 54 (03/07 0553) Resp:   [16-18] 16 (03/07 0553) BP: (152-164)/(50-81) 152/50 mmHg (03/07 0553) SpO2:  [92 %-95 %] 94 % (03/07 0553) Weight:  [101 lb 6.4 oz (45.995 kg)] 101 lb 6.4 oz (45.995 kg) (  03/07 0553)  Weight: Wt Readings from Last 3 Encounters:  08/22/12 101 lb 6.4 oz (45.995 kg)  05/19/12 97 lb 14.2 oz (44.4 kg)  04/01/12 102 lb (46.267 kg)    I&Os: Yesterday: 03/06 0701 - 03/07 0700 In: 0  Out: 600 [Urine:600] This shift: Total I/O In: 0  Out: 350 [Urine:350]   PE: GENERAL:  Elderly cachetic female. In no discomfort; no respiratory distress. PSYCH: Alert and appropriately interactive; Alert and oriented X 3 but slightly confused.  Insight:Fair   H&N: AT/[place], trachea midline EENT:  MMM, no scleral icterus, EOMi HEART: irregularly irregular, bradycardic, no murmur LUNGS: CTA B, no wheezes, no crackles Abdomen: +BS, soft, mild TTP in RUQ, spleenomegaly and hematomegaly, negative murphy's sign, no TTP in lower abdomen, no rigidity, no rebound EXTREMITIES: Moves all 4 extremities spontaneously, warm well perfused, no edema, bilateral DP and PT pulses 2/4.     LABS: CBC BMET   Recent Labs Lab 08/19/12 1913 08/20/12 0648 08/21/12 0540  WBC 11.9* 12.1* 17.5*  HGB 11.4* 12.0 11.9*  HCT 35.5* 36.7 36.7  PLT 1797* 1627* 1240*    Recent Labs Lab 08/19/12 1727 08/19/12 1751 08/20/12 0648 08/21/12 0540  NA 136 140  --  138  K 4.5 4.4  --  4.5  CL 96 97  --  104  CO2 32  --   --  24  BUN 24* 25*  --  15  CREATININE 0.79 0.90 0.63 0.65  GLUCOSE 104* 107*  --  108*  CALCIUM 9.8  --   --  9.7      08/20/2012 06:48  TSH 1.506    URINE STUDIES: none  MICRO: none  IMAGING: Nm Hepatobiliary  08/21/2012  *RADIOLOGY REPORT*  Clinical Data:  Cholecystitis  NUCLEAR MEDICINE HEPATOBILIARY IMAGING  Technique:  Sequential images of the abdomen were obtained out to 60 minutes following intravenous administration of radiopharmaceutical.  Radiopharmaceutical:  5.37mCi Tc-72m Choletec   Comparison:  CT ultrasound 08/19/2012  Findings: There is uniform uptake of radiotracer within the liver. Counts are evident within the small bowel by 15 minutes.  The gallbladder was not demonstrated by 60 minutes.  1.85 mg of IV morphine was administered.  The patient was imaged for additional 25 minutes without visualization of the gallbladder.  The patient was in extreme pain and patient refused further imaging. Typically the patient's image 30-45 minutes post morphine.  IMPRESSION: 1.  Nonvisualization of the gallbladder following morphine injection is most consistent with acute cholecystitis. 2.  Patent common bile duct.  These results will be called to the ordering clinician or representative by the Radiologist Assistant, and communication documented in the PACS Dashboard.   Original Report Authenticated By: Genevive Bi, M.D.     Medications:   . dextrose 5 % and 0.9 % NaCl with KCl 20 mEq/L 20 mL/hr at 08/22/12 0542   . acidophilus  1 capsule Oral BID  . ALPRAZolam  0.25 mg Oral BID  . amLODipine  10 mg Oral Q breakfast  . aspirin  81 mg Oral Q breakfast  . digoxin  0.125 mg Oral Q breakfast  . DULoxetine  60 mg Oral Daily  . enoxaparin (LOVENOX) injection  40 mg Subcutaneous Q24H  . feeding supplement  1 Container Oral TID BM  . gabapentin  300 mg Oral BID  . isosorbide mononitrate  60 mg Oral Q breakfast  . memantine  5 mg Oral BID  . mirtazapine  7.5 mg Oral QHS  . morphine  45 mg Oral TID  . pantoprazole  40 mg Oral Daily  . silver sulfADIAZINE  1 application Topical Q48H   HYDROcodone-acetaminophen, ondansetron (ZOFRAN) IV, ondansetron, polyethylene glycol  Assessment & Plan  LOS: 36 79 y.o. year old female with RUQ pain, leukocytosis and evidence of acute cholecystitis on HIDA scan  # Leukocytosis, consider Acute Cholecystitis:  Without evidence of gallstones and mild tenderness over gallbladder I have a low suspicion this is primary Acute Cholecystitis.  Recommend  Percutaneous drainage of Gallbaldder for both diagnostic and potentially therapeutic effect.  Recommending checking UA for consideration of UTI. Subjectively improving and requesting food.      # Cardiac (AFib, HTN, Bradycardia, Hx CAD, poor functional class): If clinical picture continues to evolve will need cardiology to evaluate pre-op.  EF 04/28/2012 with LVEF 55-60% (unable to eval diastolic dysfunction) PA Peak Pressure .    # Essential Thrombocytopenia and Hx of DVT: will need to be monitored, continue Lovenox SQ and scds.      Disposition  Recommending Percutaneous Drainage of Gallbladder for diagnostic and therapeutic effect.  Very high operative risk and unclear etiology given high false positive rate of HIDA.  Given profound thrombocytosis, multiple renal cysts, splenomegaly, dilated common bile duct consider underlying malignancy however surgical intervention for diagnostic purposes would likely result in more harm than good.    We will continue to follow daily and appreciate the consult.   Andrena Mews, DO [facility] Resident - PGY-2  08/22/2012 11:56 AM

## 2012-08-22 NOTE — Progress Notes (Signed)
[facility] PROGRESS NOTE  Tamara Turner GEX:[id number] DOB: [birth year] DOA: 08/19/2012 PCP: Florentina Jenny, MD  Brief narrative Tamara Turner is an 79 y.o. female with hx of PVD, DVT, HTN, depression, and right lateral ankle infection recently on antibiotic, presents to the ER with abdominal pain and diarrhea. She said her diarrhea has been for three days. She felt weak. Evaluation in the ER showed platelet count of 1.8 millions, with normal other indices. She also had a CT scan of the abdomen, which showed intrahepatic dilatation to 1.8 cm, and pericolic fluid, suggestive of cholecystitis or obtructive process as in cholangiocarcinoma. She also has segment of colonic wall thickening which could represents inflammatory process vs infectious process. Hospitalist was asked to admit this patients for the above reasons.   Assessment/Plan: 1. Diarrhea: Acute viral gastroenteritis. Resolved. 2. Possible acute cholecystitis: Discussed with GI and surgeons consulted. Per surgery, recommend percutaneous drainage of gallbladder- placed order. Patient afebrile but white cell count rising. Will start empiric IV Cipro and Flagyl. Patient also on chronic opioids which may mask her symptoms. He does not look septic or toxic. Discussed with IR-plan for percutaneous cholecystostomy on 3/8 the 3. Thrombocytosis, ?chronic: EDP discussed with Dr. Arline Asp on admission, who recommended checking JAK-2 and outpatient followup in his office in a week's time with repeat CBC. He also recommended aspirin but no hydroxyurea at this time. Platelet count slightly better. 4. Abdominal pain, abnormal imaging (ultrasound abdomen and CT abdomen):? Mild colonic wall thickening along the sigmoid colon, gallbladder wall thickening, distended, hepatic duct and splenomegaly. Abdominal pain has resolved. [facility] consultation appreciated-please see note. HIDA scan positive for acute cholecystitis. Surgical consultation  appreciated. 5. HTN: Controlled. 6. History of A. fib /bradycardia: Early 3/6  morning episodic sustained bradycardia in the 40s and sometimes 30s but asymptomatic. DC metoprolol. Continue digoxin. Improved. Continue aspirin for stroke prophylaxis--has not been on anticoagulants prior to admission. 7. Chronic low back pain/on chronic opioids: 8. History of depression and possible anxiety: Continue home medications   Code Status: DO NOT RESUSCITATE Family Communication: Discussed with patient Disposition Plan: Home when medically stable.   Consultants:  EDP discussed with Dr. Arline Asp, Oncology  [facility]  General surgery  Procedures:  None  Antibiotics:  None   HPI/Subjective: Denies abdominal pain or diarrhea. Mild chronic low back pain. Hungry.  Objective: Filed Vitals:   08/21/12 1458 08/21/12 2034 08/22/12 0553 08/22/12 1300  BP: 160/70 164/81 152/50 150/59  Pulse: 77 74 54 56  Temp: 97.9 F (36.6 C) 98.1 F (36.7 C) 97.4 F (36.3 C) 97.3 F (36.3 C)  TempSrc: Oral Oral Oral Oral  Resp: 18 17 16 18   Height:      Weight:   45.995 kg (101 lb 6.4 oz)   SpO2: 95% 92% 94% 95%    Intake/Output Summary (Last 24 hours) at 08/22/12 1740 Last data filed at 08/22/12 1700  Gross per 24 hour  Intake    240 ml  Output    800 ml  Net   -560 ml   Filed Weights   08/20/12 2100 08/21/12 0504 08/22/12 0553  Weight: 47 kg (103 lb 9.9 oz) 46.5 kg (102 lb 8.2 oz) 45.995 kg (101 lb 6.4 oz)    Exam:   General exam: Comfortable.  Respiratory system: Clear. No increased work of breathing.  Cardiovascular system: S1 & S2 heard, RRR. No JVD, murmurs, gallops, clicks or pedal edema. Telemetry shows A. fib with ventricular rate in the 50s to 60s.  Gastrointestinal system: Abdomen is nondistended, soft and nontender. Normal bowel sounds heard.   Central nervous system: Alert and oriented. No focal neurological deficits.  Extremities: Symmetric 5 x 5  power.   Data Reviewed: Basic Metabolic Panel:  Recent Labs Lab 08/19/12 1727 08/19/12 1751 08/20/12 0648 08/21/12 0540  NA 136 140  --  138  K 4.5 4.4  --  4.5  CL 96 97  --  104  CO2 32  --   --  24  GLUCOSE 104* 107*  --  108*  BUN 24* 25*  --  15  CREATININE 0.79 0.90 0.63 0.65  CALCIUM 9.8  --   --  9.7   Liver Function Tests:  Recent Labs Lab 08/19/12 1727 08/21/12 0540  AST 26 21  ALT 7 6  ALKPHOS 133* 93  BILITOT 0.4 0.3  PROT 7.3 6.5  ALBUMIN 3.8 3.3*    Recent Labs Lab 08/19/12 1727  LIPASE 23   No results found for this basename: AMMONIA,  in the last 168 hours CBC:  Recent Labs Lab 08/19/12 1727 08/19/12 1751 08/19/12 1913 08/20/12 0648 08/21/12 0540  WBC 12.0*  --  11.9* 12.1* 17.5*  HGB 12.2 13.9 11.4* 12.0 11.9*  HCT 38.0 41.0 35.5* 36.7 36.7  MCV 97.9  --  99.7 96.3 97.9  PLT 1892*  --  1797* 1627* 1240*   Cardiac Enzymes:  Recent Labs Lab 08/19/12 1727  TROPONINI <0.30   BNP (last 3 results) No results found for this basename: PROBNP,  in the last 8760 hours CBG:  Recent Labs Lab 08/19/12 1703  GLUCAP 81    Recent Results (from the past 240 hour(s))  MRSA PCR SCREENING     Status: None   Collection Time    08/20/12  6:09 AM      Result Value Range Status   MRSA by PCR NEGATIVE  NEGATIVE Final   Comment:            The GeneXpert MRSA Assay (FDA     approved for NASAL specimens     only), is one component of a     comprehensive MRSA colonization     surveillance program. It is not     intended to diagnose MRSA     infection nor to guide or     monitor treatment for     MRSA infections.     Studies: Nm Hepatobiliary  08/21/2012  *RADIOLOGY REPORT*  Clinical Data:  Cholecystitis  NUCLEAR MEDICINE HEPATOBILIARY IMAGING  Technique:  Sequential images of the abdomen were obtained out to 60 minutes following intravenous administration of radiopharmaceutical.  Radiopharmaceutical:  5.55mCi Tc-59m Choletec  Comparison:   CT ultrasound 08/19/2012  Findings: There is uniform uptake of radiotracer within the liver. Counts are evident within the small bowel by 15 minutes.  The gallbladder was not demonstrated by 60 minutes.  1.85 mg of IV morphine was administered.  The patient was imaged for additional 25 minutes without visualization of the gallbladder.  The patient was in extreme pain and patient refused further imaging. Typically the patient's image 30-45 minutes post morphine.  IMPRESSION: 1.  Nonvisualization of the gallbladder following morphine injection is most consistent with acute cholecystitis. 2.  Patent common bile duct.  These results will be called to the ordering clinician or representative by the Radiologist Assistant, and communication documented in the PACS Dashboard.   Original Report Authenticated By: Genevive Bi, M.D.      Additional labs:  Scheduled Meds: . acidophilus  1 capsule Oral BID  . ALPRAZolam  0.25 mg Oral BID  . amLODipine  10 mg Oral Q breakfast  . aspirin  81 mg Oral Q breakfast  . digoxin  0.125 mg Oral Q breakfast  . DULoxetine  60 mg Oral Daily  . enoxaparin (LOVENOX) injection  40 mg Subcutaneous Q24H  . feeding supplement  1 Container Oral TID BM  . gabapentin  300 mg Oral BID  . isosorbide mononitrate  60 mg Oral Q breakfast  . memantine  5 mg Oral BID  . mirtazapine  7.5 mg Oral QHS  . morphine  45 mg Oral TID  . pantoprazole  40 mg Oral Daily  . silver sulfADIAZINE  1 application Topical Q48H   Continuous Infusions: . dextrose 5 % and 0.9 % NaCl with KCl 20 mEq/L 20 mL/hr at 08/22/12 2956    Active Problems:   Weight loss   Thrombocytosis   Diarrhea   Dilated intrahepatic bile duct   Dehydration, mild   Nonspecific (abnormal) findings on radiological and other examination of biliary tract   Cholecystitis, acute    Time spent: 40 minutes    [facility]  [facility] Pager [phone].   If 8PM-8AM, please contact night-coverage at  [email or web address], password [facility] 08/22/2012, 5:40 PM  LOS: 3 days

## 2012-08-23 ENCOUNTER — Inpatient Hospital Stay (HOSPITAL_COMMUNITY): Payer: Medicare Other

## 2012-08-23 LAB — CBC
HCT: 31.3 % — ABNORMAL LOW (ref 36.0–46.0)
Hemoglobin: 10.1 g/dL — ABNORMAL LOW (ref 12.0–15.0)
MCH: 31.5 pg (ref 26.0–34.0)
MCHC: 32.3 g/dL (ref 30.0–36.0)
MCV: 97.5 fL (ref 78.0–100.0)

## 2012-08-23 LAB — COMPREHENSIVE METABOLIC PANEL
ALT: 5 U/L (ref 0–35)
CO2: 25 mEq/L (ref 19–32)
Calcium: 9.3 mg/dL (ref 8.4–10.5)
Creatinine, Ser: 0.67 mg/dL (ref 0.50–1.10)
GFR calc Af Amer: 88 mL/min — ABNORMAL LOW (ref >90)
GFR calc non Af Amer: 76 mL/min — ABNORMAL LOW (ref >90)
Glucose, Bld: 90 mg/dL (ref 70–99)
Sodium: 139 mEq/L (ref 135–145)
Total Bilirubin: 0.3 mg/dL (ref 0.3–1.2)

## 2012-08-23 MED ORDER — COLLAGENASE 250 UNIT/GM EX OINT
TOPICAL_OINTMENT | Freq: Every day | CUTANEOUS | Status: DC
  Administered 2012-08-23 – 2012-08-28 (×6): via TOPICAL
  Filled 2012-08-23: qty 30

## 2012-08-23 NOTE — Consult Note (Signed)
WOC consult Note Reason for Consult: ulceration of the right lateral malleolus  Wound type: ulceration of the right lateral malleolus  Measurement:1.0cm x 1.0cm x 0.2cm  Wound bed:95% ylellow/white adherent slough Drainage (amount, consistency, odor) yellow-thick, no odor Periwound:intact Dressing procedure/placement/frequency: enzymatic debridement ointment daily to clear slough.  Cover with dry dressing.    Discussed with bedside nurse to apply once up from pharmacy.  Re consult if needed, will not follow at this time. Thanks  [employer], CWOCN [phone])

## 2012-08-23 NOTE — Consult Note (Signed)
Reason for Consult:cholecystitis Consulting Radiologist: Grace Isaac Referring Physician: Johna Sheriff   HPI: Tamara Turner is an 79 y.o. female who was admitted with primary c/o diarrhea and generalized abd pain. Her workup has led to concern for cholecystitis. She is a poor surgical candidate and IR is requested to attempt perc chole drain. Case and images were reviewed and discussed by Dr. Grace Isaac and Dr. Johna Sheriff, and thought process is to offer attempt at perc chole drain. She developed a rising WBC up to 17k yesterday and was started on IV abx, and now WBC dropped back to normal. She really denies much of any abd pain and the diarrhea has resolved.  PMHx and meds reviewed.  Past Medical History:  Past Medical History  Diagnosis Date  . Vascular abnormality   . DVT (deep venous thrombosis)   . Alcoholism   . Depression   . Hypertension   . UTI (lower urinary tract infection)   . Chronic back pain   . Arthritis     all over  . Renal disorder   . Heart disease   . Hyperlipidemia   . Phlebitis   . UTI (urinary tract infection)   . CAD (coronary artery disease)     Surgical History:  Past Surgical History  Procedure Laterality Date  . Thrombectomy      R leg  . Shoulder surgery    . Hand surgery    . Appendectomy    . Coronary angioplasty with stent placement    . Spine surgery      x 3  . Exploratory laparotomy      associated with "trying to get pregnant" - low midline scar    Family History:  Family History  Problem Relation Age of Onset  . Colon cancer Neg Hx   . Pancreatic cancer Sister     Social History:  reports that she has been smoking Cigarettes.  She has been smoking about 0.25 packs per day. She has never used smokeless tobacco. She reports that she does not drink alcohol or use illicit drugs.  Allergies:  Allergies  Allergen Reactions  . Penicillins Hives  . Sulfa Antibiotics Nausea Only    Medications:  Prior to Admission:  Prescriptions prior to  admission  Medication Sig Dispense Refill  . ALPRAZolam (XANAX) 0.25 MG tablet Take 0.25 mg by mouth 2 (two) times daily.      Marland Kitchen alum & mag hydroxide-simeth (MI-ACID) 200-200-20 MG/5ML suspension Take 15 mLs by mouth 3 (three) times daily.      Marland Kitchen amLODipine (NORVASC) 10 MG tablet Take 10 mg by mouth daily with breakfast.       . aspirin 81 MG chewable tablet Chew 81 mg by mouth daily with breakfast.      . calcium-vitamin D (OSCAL WITH D) 500-200 MG-UNIT per tablet Take 1 tablet by mouth daily with breakfast.      . cholestyramine light (PREVALITE) 4 G packet Take 8 g by mouth daily before breakfast.       . digoxin (LANOXIN) 0.125 MG tablet Take 0.125 mg by mouth daily with breakfast.       . DULoxetine (CYMBALTA) 60 MG capsule Take 1 capsule (60 mg total) by mouth daily.  30 capsule  0  . feeding supplement (ENSURE IMMUNE HEALTH) LIQD Take 237 mLs by mouth 2 (two) times daily before lunch and supper.      . gabapentin (NEURONTIN) 300 MG capsule Take 300 mg by mouth 2 (two) times daily.       Marland Kitchen  HYDROcodone-acetaminophen (NORCO/VICODIN) 5-325 MG per tablet Take 1 tablet by mouth every 4 (four) hours as needed for pain. Pain  30 tablet  0  . isosorbide mononitrate (IMDUR) 60 MG 24 hr tablet Take 60 mg by mouth daily with breakfast.       . lactobacillus (FLORANEX/LACTINEX) PACK Take 1 g by mouth 2 (two) times daily.      . lansoprazole (PREVACID) 30 MG capsule Take 30 mg by mouth daily with breakfast.      . loperamide (IMODIUM) 2 MG capsule Take 2-4 mg by mouth as needed. Take 2 caps by mouth after first loose stool then 1 cap by mouth after each subsequent      . memantine (NAMENDA) 5 MG tablet Take 5 mg by mouth 2 (two) times daily.      . metoprolol tartrate (LOPRESSOR) 25 MG tablet Take 25 mg by mouth daily with breakfast.       . mirtazapine (REMERON) 15 MG tablet Take 7.5 mg by mouth at bedtime.      Marland Kitchen morphine (MS CONTIN) 15 MG 12 hr tablet Take 45 mg by mouth 3 (three) times daily.       . Multiple Vitamins-Minerals (PRESERVISION AREDS PO) Take 1 capsule by mouth 2 (two) times daily.      . polyethylene glycol (MIRALAX / GLYCOLAX) packet Take 17 g by mouth daily.       . silver sulfADIAZINE (SILVADENE) 1 % cream Apply 1 application topically every other day. With wound cleaning procedure      . spironolactone (ALDACTONE) 25 MG tablet Take 25 mg by mouth daily with breakfast.         ROS: See HPI for pertinent findings, otherwise complete 10 system review negative.  Physical Exam: Blood pressure 143/51, pulse 77, temperature 98.4 F (36.9 C), temperature source Oral, resp. rate 18, height 5\' 8"  (1.727 m), weight 106 lb 12.8 oz (48.444 kg), SpO2 98.00%. Lungs: CTA without w/r/r Heart: Regular Abdomen: soft, ND. Minimal tenderness elicited on right side of abdomen, no peritoneal signs. No mass effect.   Labs: CBC  Recent Labs  08/21/12 0540 08/23/12 0610  WBC 17.5* 8.4  HGB 11.9* 10.1*  HCT 36.7 31.3*  PLT 1240* 573*   MET  Recent Labs  08/21/12 0540 08/23/12 0610  NA 138 139  K 4.5 4.1  CL 104 106  CO2 24 25  GLUCOSE 108* 90  BUN 15 17  CREATININE 0.65 0.67  CALCIUM 9.7 9.3    Recent Labs  08/23/12 0610  PROT 5.9*  ALBUMIN 2.8*  AST 19  ALT 5  ALKPHOS 90  BILITOT 0.3   PT/INR  Recent Labs  08/23/12 0610  LABPROT 16.9*  INR 1.41     Nm Hepatobiliary  08/21/2012  *RADIOLOGY REPORT*  Clinical Data:  Cholecystitis  NUCLEAR MEDICINE HEPATOBILIARY IMAGING  Technique:  Sequential images of the abdomen were obtained out to 60 minutes following intravenous administration of radiopharmaceutical.  Radiopharmaceutical:  5.68mCi Tc-31m Choletec  Comparison:  CT ultrasound 08/19/2012  Findings: There is uniform uptake of radiotracer within the liver. Counts are evident within the small bowel by 15 minutes.  The gallbladder was not demonstrated by 60 minutes.  1.85 mg of IV morphine was administered.  The patient was imaged for additional 25 minutes  without visualization of the gallbladder.  The patient was in extreme pain and patient refused further imaging. Typically the patient's image 30-45 minutes post morphine.  IMPRESSION: 1.  Nonvisualization of the  gallbladder following morphine injection is most consistent with acute cholecystitis. 2.  Patent common bile duct.  These results will be called to the ordering clinician or representative by the Radiologist Assistant, and communication documented in the PACS Dashboard.   Original Report Authenticated By: Genevive Bi, M.D.     Assessment/Plan: Suspicion for Cholecystitis by CT and HIDA Agree with surgery that not clear cut clinical picture and symptoms have essentially resolved, as well WBC has returned to normal this am and pt remains afebrile. We discussed IR attempt at image guided perc chole, including use of sedation, risks, complications, benefits and possible no benefit. She is undecided if she would want to proceed. She asked if it would remedy her recurrent bouts of diarrhea and I could not guarantee that it would. Known history of mild dementia, though pt had very lucid conversation and seems to understand everything we discussed. She has POA (niece) but states she makes most of her own decisions.  Further discussed with Dr. Sharl Ma Dr. Leone Payor his note and recs reviewed. Dr. Grace Isaac will re-discuss with surgery team but feel holding off on any intervention at this point is reasonable. Can certainly be available for attempt at perc drain if clinically worsens or WBC rises again.  Brayton El PA-C 08/23/2012, 9:40 AM

## 2012-08-23 NOTE — Progress Notes (Signed)
[facility] PROGRESS NOTE  Tamara Turner WUJ:[id number] DOB: 09-Oct-1921 DOA: 08/19/2012 PCP: Florentina Jenny, MD  Brief narrative Tamara Turner is an 79 y.o. female with hx of PVD, DVT, HTN, depression, and right lateral ankle infection recently on antibiotic, presents to the ER with abdominal pain and diarrhea. She said her diarrhea has been for three days. She felt weak. Evaluation in the ER showed platelet count of 1.8 millions, with normal other indices. She also had a CT scan of the abdomen, which showed intrahepatic dilatation to 1.8 cm, and pericolic fluid, suggestive of cholecystitis or obtructive process as in cholangiocarcinoma. She also has segment of colonic wall thickening which could represents inflammatory process vs infectious process. Hospitalist was asked to admit this patients for the above reasons.   Assessment/Plan:  1. Possible acute cholecystitis: Discussed with GI and IR  at this time patient has no abdominal pain,and they do not think that patient will benefit from percutaneous tube. Continue with  IV Cipro and Flagyl. Continue to monitor. WBC is normal today.  2. Thrombocytosis, ?chronic: EDP discussed with Dr. Arline Asp on admission, who recommended checking JAK-2 and outpatient followup in his office in a week's time with repeat CBC. He also recommended aspirin but no hydroxyurea at this time. Platelet count slightly better.   3. Abdominal pain, abnormal imaging (ultrasound abdomen and CT abdomen):resolved. Secondary to? Cholecystitis, Discussed wit Dr Concha Se, patient has chronic abdominal pain and he does not think that she would benefit from perc cholecystomy tube.                   4. HTN: BP controlled  5. History of A. fib /bradycardia:Intermittent bradycardia, patient is on digoxin continue to monitor.  6. History of depression and possible anxiety: Continue home medications   Code Status: DO NOT RESUSCITATE Family Communication: Discussed with  patient's niece on phone and updated about the patient's condition.  Disposition Plan: assisted living   Consultants:  EDP discussed with Dr. Arline Asp, Oncology  [facility]  General surgery  Procedures:  None  Antibiotics:  None   HPI/Subjective: Patient seen and examined, denies abdominal pain.  Objective: Filed Vitals:   08/22/12 1300 08/22/12 2024 08/23/12 0423 08/23/12 1315  BP: 150/59 128/98 143/51 143/64  Pulse: 56 73 77 69  Temp: 97.3 F (36.3 C) 98.1 F (36.7 C) 98.4 F (36.9 C) 97.5 F (36.4 C)  TempSrc: Oral Oral Oral Oral  Resp: 18 18 18 20   Height:      Weight:   48.444 kg (106 lb 12.8 oz)   SpO2: 95% 99% 98% 98%    Intake/Output Summary (Last 24 hours) at 08/23/12 1402 Last data filed at 08/23/12 1300  Gross per 24 hour  Intake   1080 ml  Output    725 ml  Net    355 ml   Filed Weights   08/21/12 0504 08/22/12 0553 08/23/12 0423  Weight: 46.5 kg (102 lb 8.2 oz) 45.995 kg (101 lb 6.4 oz) 48.444 kg (106 lb 12.8 oz)    Exam:  Physical Exam: Head: Normocephalic, atraumatic.  Eyes: No signs of jaundice, EOMI Mouth: Mucous membranes dry.  Neck: supple,No deformities, masses, or tenderness noted. Lungs: Normal respiratory effort. B/L Clear to auscultation, no crackles or wheezes.  Heart: Regular RR. S1 and S2 normal  Abdomen: BS normoactive. Soft, Nondistended, non-tender.  Extremities: No pretibial edema, no erythema    Data Reviewed: Basic Metabolic Panel:  Recent Labs Lab 08/19/12 1727 08/19/12 1751 08/20/12 9562  08/21/12 0540 08/23/12 0610  NA 136 140  --  138 139  K 4.5 4.4  --  4.5 4.1  CL 96 97  --  104 106  CO2 32  --   --  24 25  GLUCOSE 104* 107*  --  108* 90  BUN 24* 25*  --  15 17  CREATININE 0.79 0.90 0.63 0.65 0.67  CALCIUM 9.8  --   --  9.7 9.3   Liver Function Tests:  Recent Labs Lab 08/19/12 1727 08/21/12 0540 08/23/12 0610  AST 26 21 19   ALT 7 6 5   ALKPHOS 133* 93 90  BILITOT 0.4 0.3  0.3  PROT 7.3 6.5 5.9*  ALBUMIN 3.8 3.3* 2.8*    Recent Labs Lab 08/19/12 1727  LIPASE 23   No results found for this basename: AMMONIA,  in the last 168 hours CBC:  Recent Labs Lab 08/19/12 1727 08/19/12 1751 08/19/12 1913 08/20/12 0648 08/21/12 0540 08/23/12 0610  WBC 12.0*  --  11.9* 12.1* 17.5* 8.4  HGB 12.2 13.9 11.4* 12.0 11.9* 10.1*  HCT 38.0 41.0 35.5* 36.7 36.7 31.3*  MCV 97.9  --  99.7 96.3 97.9 97.5  PLT 1892*  --  1797* 1627* 1240* 573*   Cardiac Enzymes:  Recent Labs Lab 08/19/12 1727  TROPONINI <0.30   BNP (last 3 results) No results found for this basename: PROBNP,  in the last 8760 hours CBG:  Recent Labs Lab 08/19/12 1703  GLUCAP 81    Recent Results (from the past 240 hour(s))  MRSA PCR SCREENING     Status: None   Collection Time    08/20/12  6:09 AM      Result Value Range Status   MRSA by PCR NEGATIVE  NEGATIVE Final   Comment:            The GeneXpert MRSA Assay (FDA     approved for NASAL specimens     only), is one component of a     comprehensive MRSA colonization     surveillance program. It is not     intended to diagnose MRSA     infection nor to guide or     monitor treatment for     MRSA infections.     Studies: Nm Hepatobiliary  08/21/2012  *RADIOLOGY REPORT*  Clinical Data:  Cholecystitis  NUCLEAR MEDICINE HEPATOBILIARY IMAGING  Technique:  Sequential images of the abdomen were obtained out to 60 minutes following intravenous administration of radiopharmaceutical.  Radiopharmaceutical:  5.28mCi Tc-81m Choletec  Comparison:  CT ultrasound 08/19/2012  Findings: There is uniform uptake of radiotracer within the liver. Counts are evident within the small bowel by 15 minutes.  The gallbladder was not demonstrated by 60 minutes.  1.85 mg of IV morphine was administered.  The patient was imaged for additional 25 minutes without visualization of the gallbladder.  The patient was in extreme pain and patient refused further imaging.  Typically the patient's image 30-45 minutes post morphine.  IMPRESSION: 1.  Nonvisualization of the gallbladder following morphine injection is most consistent with acute cholecystitis. 2.  Patent common bile duct.  These results will be called to the ordering clinician or representative by the Radiologist Assistant, and communication documented in the PACS Dashboard.   Original Report Authenticated By: Genevive Bi, M.D.      Additional labs:   Scheduled Meds: . acidophilus  1 capsule Oral BID  . ALPRAZolam  0.25 mg Oral BID  . amLODipine  10 mg  Oral Q breakfast  . aspirin  81 mg Oral Q breakfast  . ciprofloxacin  400 mg Intravenous Q12H  . collagenase   Topical Daily  . digoxin  0.125 mg Oral Q breakfast  . DULoxetine  60 mg Oral Daily  . [START ON 08/24/2012] enoxaparin (LOVENOX) injection  30 mg Subcutaneous Q24H  . feeding supplement  1 Container Oral TID BM  . gabapentin  300 mg Oral BID  . isosorbide mononitrate  60 mg Oral Q breakfast  . memantine  5 mg Oral BID  . metronidazole  500 mg Intravenous Q8H  . mirtazapine  7.5 mg Oral QHS  . morphine  45 mg Oral TID  . pantoprazole  40 mg Oral Daily  . silver sulfADIAZINE  1 application Topical Q48H   Continuous Infusions: . dextrose 5 % and 0.9 % NaCl with KCl 20 mEq/L 20 mL/hr at 08/22/12 1610    Active Problems:   Weight loss   Thrombocytosis   Diarrhea   Dilated intrahepatic bile duct   Dehydration, mild   Nonspecific (abnormal) findings on radiological and other examination of biliary tract   Cholecystitis, acute    Time spent: 40 minutes    [facility]  [facility] Pager [phone].   If 8PM-8AM, please contact night-coverage at [email or web address], password [facility] 08/23/2012, 2:02 PM  LOS: 4 days

## 2012-08-23 NOTE — Progress Notes (Signed)
[facility] Progress Note  Patient Name: Tamara Turner Date of Encounter: 08/23/2012, 9:54 AM    Subjective  She denies any pain this AM   Objective   Scheduled Meds: . acidophilus  1 capsule Oral BID  . ALPRAZolam  0.25 mg Oral BID  . amLODipine  10 mg Oral Q breakfast  . aspirin  81 mg Oral Q breakfast  . ciprofloxacin  400 mg Intravenous Q12H  . collagenase   Topical Daily  . digoxin  0.125 mg Oral Q breakfast  . DULoxetine  60 mg Oral Daily  . [START ON 08/24/2012] enoxaparin (LOVENOX) injection  30 mg Subcutaneous Q24H  . feeding supplement  1 Container Oral TID BM  . gabapentin  300 mg Oral BID  . isosorbide mononitrate  60 mg Oral Q breakfast  . memantine  5 mg Oral BID  . metronidazole  500 mg Intravenous Q8H  . mirtazapine  7.5 mg Oral QHS  . morphine  45 mg Oral TID  . pantoprazole  40 mg Oral Daily  . silver sulfADIAZINE  1 application Topical Q48H   Continuous Infusions: . dextrose 5 % and 0.9 % NaCl with KCl 20 mEq/L 20 mL/hr at 08/22/12 0542   PRN Meds:.HYDROcodone-acetaminophen, ondansetron (ZOFRAN) IV, ondansetron, polyethylene glycol   Physical Exam: Filed Vitals:   08/23/12 0423  BP: 143/51  Pulse: 77  Temp: 98.4 F (36.9 C)  Resp: 18   General: NAD Abdomen: soft and nontender, BS + Extremities: no edema Psych: pleasant and cooperative    Intake/Output Summary (Last 24 hours) at 08/23/12 0954 Last data filed at 08/23/12 0834  Gross per 24 hour  Intake   1080 ml  Output    550 ml  Net    530 ml    Labs:  Recent Labs  08/21/12 0540 08/23/12 0610  NA 138 139  K 4.5 4.1  CL 104 106  CO2 24 25  GLUCOSE 108* 90  BUN 15 17  CREATININE 0.65 0.67  CALCIUM 9.7 9.3    Recent Labs  08/21/12 0540 08/23/12 0610  AST 21 19  ALT 6 5  ALKPHOS 93 90  BILITOT 0.3 0.3  PROT 6.5 5.9*  ALBUMIN 3.3* 2.8*     Recent Labs  08/21/12 0540 08/23/12 0610  WBC 17.5* 8.4  HGB 11.9* 10.1*  HCT 36.7 31.3*  MCV 97.9 97.5  PLT  1240* 573*      Radiology/Studies:  Nm Hepatobiliary  08/21/2012  *RADIOLOGY REPORT*  Clinical Data:  Cholecystitis  NUCLEAR MEDICINE HEPATOBILIARY IMAGING  Technique:  Sequential images of the abdomen were obtained out to 60 minutes following intravenous administration of radiopharmaceutical.  Radiopharmaceutical:  5.22mCi Tc-84m Choletec  Comparison:  CT ultrasound 08/19/2012  Findings: There is uniform uptake of radiotracer within the liver. Counts are evident within the small bowel by 15 minutes.  The gallbladder was not demonstrated by 60 minutes.  1.85 mg of IV morphine was administered.  The patient was imaged for additional 25 minutes without visualization of the gallbladder.  The patient was in extreme pain and patient refused further imaging. Typically the patient's image 30-45 minutes post morphine.  IMPRESSION: 1.  Nonvisualization of the gallbladder following morphine injection is most consistent with acute cholecystitis. 2.  Patent common bile duct.  These results will be called to the ordering clinician or representative by the Radiologist Assistant, and communication documented in the PACS Dashboard.   Original Report Authenticated By: Genevive Bi, M.D.    US Abdomen  Complete  08/20/2012  *RADIOLOGY REPORT*  Clinical Data:  Right upper quadrant abdominal pain.  ABDOMINAL ULTRASOUND COMPLETE  Comparison:  CT of the abdomen and pelvis performed earlier today at 09:16 p.m.  Findings:  Gallbladder: The gallbladder is diffusely thick-walled, with the wall measuring up to 0.7 cm in thickness.  No pericholecystic fluid is seen.  This is nonspecific, and could reflect a variety of conditions, such as hypoalbuminemia or congestive right heart failure.  No stones are identified; no ultrasonographic Murphy's sign is elicited.  Common Bile Duct:  1.3 cm in diameter; diffusely dilated, of uncertain significance.  Liver: Diffusely increased hepatic echogenicity and coarsened echotexture, compatible with  fatty infiltration.  Intrahepatic biliary ductal dilatation at the left hepatic lobe is not well characterized on ultrasound.  A 1.2 cm cyst is noted within the inferior aspect of the right hepatic lobe.  Limited Doppler evaluation demonstrates normal blood flow within the liver.  IVC:  Unremarkable in appearance.  Pancreas:  Although the pancreas is difficult to visualize in its entirety due to overlying bowel gas, no focal pancreatic abnormality is identified.  Spleen:  14.4 cm in length; increased in size, with increased echogenicity.  Right kidney:  9.8 cm in length; grossly normal in size and configuration.  Diffusely increased parenchymal echogenicity is noted.  No evidence of hydronephrosis.  Multiple large right-sided renal cysts are seen, measuring up to 9.0 cm in size.  Left kidney:  11.1 cm in length; grossly normal in size and configuration.  Diffusely increased parenchymal echogenicity is noted.  No evidence of hydronephrosis. Multiple large left-sided renal cysts are seen, measuring up to 5.9 cm in size.  Abdominal Aorta:  Normal in caliber; no aneurysm identified. Scattered calcific atherosclerotic disease is noted along the visualized abdominal aorta.  IMPRESSION:  1.  Diffusely thick-walled gallbladder, with the wall measuring up to 0.7 cm in thickness.  No pericholecystic fluid seen; no stones identified.  No ultrasonographic Murphy's sign elicited.  Findings are nonspecific, and could reflect a variety of conditions, such as hypoalbuminemia or congestive right heart failure. 2.  Diffuse dilatation of the common hepatic duct, of uncertain significance.  Intrahepatic biliary ductal dilatation at the left hepatic lobe is not well characterized on ultrasound.  Findings on CT raise concern for distal obstruction, not well assessed on this study.  As suggested on CT, MRCP could be considered for further evaluation, when and as deemed clinically appropriate (when and if the patient can tolerate holding  her breath for the study). 3.  Diffuse fatty infiltration within the liver.  Small hepatic cyst seen. 4.  Splenomegaly again noted. 5.  Large bilateral renal cysts seen. 6.  Diffusely increased renal parenchymal echogenicity noted bilaterally, raising question for medical renal disease. 7.  Scattered calcific atherosclerotic disease noted along the visualized abdominal aorta.   Original Report Authenticated By: Tonia Ghent, M.D.    Ct Abdomen Pelvis W Contrast  08/19/2012  *RADIOLOGY REPORT*  Clinical Data: Diarrhea.  CT ABDOMEN AND PELVIS WITH CONTRAST  Technique:  Multidetector CT imaging of the abdomen and pelvis was performed following the standard protocol during bolus administration of intravenous contrast.  Contrast: 80mL OMNIPAQUE IOHEXOL 300 MG/ML  SOLN  Comparison: CT of the abdomen and pelvis performed 03/28/2012  Findings: Increased interstitial prominence at the lung bases could reflect mild interstitial edema.  Note is again made of marked prominence of the biliary ducts within the left hepatic lobe, with marked distension of the common hepatic duct, measuring 1.8 cm  in diameter.  This again raises concern for an underlying obstructive process, such as cholangiocarcinoma; as before, MRCP could be considered for further evaluation, when and as deemed clinically appropriate.  Underlying periportal edema is noted.  There is diffuse wall thickening and a small amount of pericholecystic fluid with respect to the gallbladder; the gallbladder lumen is relatively contracted. This is nonspecific, but raises question for cholecystitis.  Would correlate for associated symptoms.  The liver is otherwise grossly unremarkable in appearance.  The spleen is enlarged, measuring 14.2 cm, and appears somewhat bulky.  The pancreas and adrenal glands are unremarkable.  Numerous large bilateral renal cysts are again seen, measuring up to 8.2 cm in size.  There is associated distortion of the renal parenchyma bilaterally.   Mild nonspecific perinephric stranding is seen.  There is no evidence of hydronephrosis.  No renal or ureteral stones are identified.  No free fluid is identified.  The small bowel is unremarkable in appearance.  The stomach is within normal limits.  No acute vascular abnormalities are seen.  Extensive diffuse calcification is noted along the abdominal aorta and its branches, including along the proximal renal arteries bilaterally.  There is likely moderate to severe stenosis along the common and external iliac arteries bilaterally, due to extensive calcific atherosclerotic disease.  The patient is status post appendectomy.  The colon is difficult to fully characterize due to surrounding bowel loops, but there may be mild colonic wall thickening along the sigmoid colon, which could reflect a mild infectious or inflammatory process.  The bladder is mildly distended and grossly unremarkable in appearance.  The uterus is not well characterized.  No suspicious adnexal masses are seen.  No inguinal lymphadenopathy is seen.  No acute osseous abnormalities are identified.  IMPRESSION:  1.  Question of mild colonic wall thickening along the sigmoid colon, which could reflect a mild infectious or inflammatory process.  Alternatively, this could simply reflect decompression; the colon is difficult to fully assess. 2.  Diffuse wall thickening and a small amount of pericholecystic fluid with respect to the gallbladder, new from the prior study. The gallbladder lumen is relatively contracted.  This is nonspecific, but raises question for cholecystitis.  Would correlate for associated symptoms. 3.  Note again made of marked prominence of the biliary ducts within the left hepatic lobe, with marked distension of the common hepatic duct to 1.8 cm in diameter.  As before, this raises concern for underlying obstructive process, suggest cholangiocarcinoma; MRCP could be considered for further evaluation, when and as deemed clinically  appropriate (when and if the patient can tolerate holding her breath for the MRI study). 4.  Increased interstitial prominence of the lung bases could reflect mild interstitial edema. 5.  Splenomegaly noted. 6.  Underlying periportal edema seen. 7.  Numerous large bilateral renal cysts again seen, with distortion of the underlying renal parenchyma. 8.  Extensive diffuse calcification along the abdominal aorta and its branches, including along the proximal renal arteries bilaterally.  Likely moderate to severe stenosis along the common and external iliac arteries bilaterally, due to calcific atherosclerotic disease.   Original Report Authenticated By: Tonia Ghent, M.D.      Assessment and Plan  1) Abdominal pain and suspicion of cholecystitis by CT and HIDA - improved 2) Chronic abdominal pain and diarrhea and chronically dilated CHD - known from prior evaluation by me and review of records from [place] where she used to be followed - normal LFT's go against cholangiocarcinoma, etc 3) Dementia 4)  thrombocytosis - had been on hydrea in past - Dr. Shawn Stall aware and will f/u - in past decision to stop this with lower PLT's She seems sig better today so maybe does not need the tube - has had chronic abdominal pain problems in the past  Would continue Abx - not wrong to place a perc drain but since she is improving may not need that and she is waffling - would need to discuss w/ POA   Iva Boop, MD, [facility] [phone] (pager) 08/23/2012 10:06 AM

## 2012-08-23 NOTE — Evaluation (Signed)
Physical Therapy Evaluation Patient Details Name: Tamara Turner MRN: [id number] DOB: 1922-03-12 Today's Date: 08/23/2012 Time: 4098-1191 PT Time Calculation (min): 28 min  PT Assessment / Plan / Recommendation Clinical Impression  pt adm with abdominal pain and diarrhea.  Suspect cholecystitis.  Pt can benerfit from PT to maximize function strength and gait stability    PT Assessment  Patient needs continued PT services    Follow Up Recommendations  Home health PT;Supervision for mobility/OOB    Does the patient have the potential to tolerate intense rehabilitation      Barriers to Discharge        Equipment Recommendations  None recommended by PT    Recommendations for Other Services     Frequency Min 3X/week    Precautions / Restrictions Precautions Precautions: Fall Restrictions Weight Bearing Restrictions: No   Pertinent Vitals/Pain       Mobility  Bed Mobility Bed Mobility: Supine to Sit;Sitting - Scoot to Edge of Bed Supine to Sit: 4: Min assist;HOB elevated Sitting - Scoot to [place] of Bed: 4: Min guard Details for Bed Mobility Assistance: vc's for hand placement Transfers Transfers: Sit to Stand;Stand to Sit Sit to Stand: 4: Min assist;With upper extremity assist;From bed Stand to Sit: 4: Min assist;With upper extremity assist;To chair/3-in-1 Details for Transfer Assistance: reinforced hand placement; mild steadying assist initially Ambulation/Gait Ambulation/Gait Assistance: 4: Min assist Ambulation Distance (Feet): 25 Feet Assistive device: Rolling walker Ambulation/Gait Assistance Details: short, mildly unsteady gait with consistently flexed knees.  Some difficulty maneuvering the RW Gait Pattern: Step-through pattern;Decreased step length - right;Decreased step length - left;Decreased stride length Gait velocity: slow Stairs: No Wheelchair Mobility Wheelchair Mobility: No    Exercises     PT Diagnosis: Generalized weakness  PT Problem List:  Decreased strength;Decreased activity tolerance;Decreased balance;Decreased mobility;Decreased knowledge of use of DME PT Treatment Interventions: DME instruction;Gait training;Functional mobility training;Therapeutic activities;Therapeutic exercise;Balance training;Patient/family education   PT Goals Acute Rehab PT Goals PT Goal Formulation: With patient Time For Goal Achievement: 08/30/12 Potential to Achieve Goals: Good Pt will go Supine/Side to Sit: with modified independence;with HOB 0 degrees PT Goal: Supine/Side to Sit - Progress: Goal set today Pt will go Sit to Stand: with supervision PT Goal: Sit to Stand - Progress: Goal set today Pt will Transfer Bed to Chair/Chair to Bed: with supervision PT Transfer Goal: Bed to Chair/Chair to Bed - Progress: Goal set today Pt will Ambulate: 51 - 150 feet;with least restrictive assistive device;Other (comment) (min guard) PT Goal: Ambulate - Progress: Goal set today  Visit Information  Last PT Received On: 08/23/12 Assistance Needed: +1    Subjective Data  Subjective: I don't usually get to walk much except in therapy, mostly I roll around in my w/c.  I was happy with how far I could walk in therapy. Patient Stated Goal: Get back home   Prior Functioning  Home Living Lives With: Alone Type of Home: Assisted living Home Access: Level entry Home Layout: One level Bathroom Shower/Tub: Tub/shower unit;Other (comment) (takes sink bath) Bathroom Toilet: Standard Home Adaptive Equipment: Shower chair without back;Wheelchair - manual;Walker - rolling Prior Function Level of Independence: Independent with assistive device(s) Driving: No Communication Communication: No difficulties    Cognition  Cognition Overall Cognitive Status: Appears within functional limits for tasks assessed/performed Arousal/Alertness: Awake/alert Orientation Level: Appears intact for tasks assessed Behavior During Session: [facility] for tasks performed     Extremity/Trunk Assessment Right Upper Extremity Assessment RUE ROM/Strength/Tone: Deficits RUE ROM/Strength/Tone Deficits: Bil should flexion  to ~60*, biceps/triceps2+/5, grip WFL Left Upper Extremity Assessment LUE ROM/Strength/Tone: Deficits LUE ROM/Strength/Tone Deficits: See R UE Right Lower Extremity Assessment RLE ROM/Strength/Tone: [facility] for tasks assessed;Deficits RLE ROM/Strength/Tone Deficits: grossly weak at 3+ to 4-/5, Hams conracture to -15* Left Lower Extremity Assessment LLE ROM/Strength/Tone: [facility] for tasks assessed;Deficits LLE ROM/Strength/Tone Deficits: see R LE Trunk Assessment Trunk Assessment: Other exceptions (weak trunk--diff. stabilizing for UE strength)   Balance Balance Balance Assessed: Yes Static Sitting Balance Static Sitting - Balance Support: Feet supported;No upper extremity supported Static Sitting - Level of Assistance: 6: Modified independent (Device/Increase time) Static Sitting - Comment/# of Minutes: 8 min EOB  End of Session PT - End of Session Activity Tolerance: Patient tolerated treatment well Patient left: in chair;with call bell/phone within reach Nurse Communication: Mobility status  GP     Mottinger, Eliseo Gum 08/23/2012, 10:43 AM 08/23/2012   Bing, PT [phone] (pager)

## 2012-08-23 NOTE — Consult Note (Signed)
Discussed with Dr. Johna Sheriff and given pt's improving WBC and input from Drs. Leone Payor and [place], will not perform cholecystomy at the present time.  Please re-consult if this procedure is desired in the future.

## 2012-08-23 NOTE — Progress Notes (Signed)
Call received from CMT that pt was brady 43 & unsustained , and HR73 currently.  Pt asymptomatic.  BP157/70, Cammie Mcgee primary nurse aware.  Amanda Pea, Charity fundraiser.

## 2012-08-23 NOTE — Progress Notes (Signed)
Patient ID: Tamara Turner, female   DOB: May 15, 1922, 79 y.o.   MRN: [id number]    Subjective: Denies any abd pain today.  Up in chair  Objective: Vital signs in last 24 hours: Temp:  [97.3 F (36.3 C)-98.4 F (36.9 C)] 98.4 F (36.9 C) (03/08 0423) Pulse Rate:  [56-77] 77 (03/08 0423) Resp:  [18] 18 (03/08 0423) BP: (128-150)/(51-98) 143/51 mmHg (03/08 0423) SpO2:  [95 %-99 %] 98 % (03/08 0423) Weight:  [106 lb 12.8 oz (48.444 kg)] 106 lb 12.8 oz (48.444 kg) (03/08 0423) Last BM Date: 08/20/12  Intake/Output from previous day: 03/07 0701 - 03/08 0700 In: 1080 [P.O.:480; IV Piggyback:600] Out: 900 [Urine:900] Intake/Output this shift:    General appearance: alert, cooperative, no distress and chronically ill appearing GI: normal findings: soft, non-tender  Lab Results:   Recent Labs  08/21/12 0540 08/23/12 0610  WBC 17.5* 8.4  HGB 11.9* 10.1*  HCT 36.7 31.3*  PLT 1240* 573*   BMET  Recent Labs  08/21/12 0540 08/23/12 0610  NA 138 139  K 4.5 4.1  CL 104 106  CO2 24 25  GLUCOSE 108* 90  BUN 15 17  CREATININE 0.65 0.67  CALCIUM 9.7 9.3     Studies/Results: Nm Hepatobiliary  08/21/2012  *RADIOLOGY REPORT*  Clinical Data:  Cholecystitis  NUCLEAR MEDICINE HEPATOBILIARY IMAGING  Technique:  Sequential images of the abdomen were obtained out to 60 minutes following intravenous administration of radiopharmaceutical.  Radiopharmaceutical:  5.34mCi Tc-25m Choletec  Comparison:  CT ultrasound 08/19/2012  Findings: There is uniform uptake of radiotracer within the liver. Counts are evident within the small bowel by 15 minutes.  The gallbladder was not demonstrated by 60 minutes.  1.85 mg of IV morphine was administered.  The patient was imaged for additional 25 minutes without visualization of the gallbladder.  The patient was in extreme pain and patient refused further imaging. Typically the patient's image 30-45 minutes post morphine.  IMPRESSION: 1.  Nonvisualization of  the gallbladder following morphine injection is most consistent with acute cholecystitis. 2.  Patent common bile duct.  These results will be called to the ordering clinician or representative by the Radiologist Assistant, and communication documented in the PACS Dashboard.   Original Report Authenticated By: Genevive Bi, M.D.     Anti-infectives: Anti-infectives   Start     Dose/Rate Route Frequency Ordered Stop   08/22/12 1900  metroNIDAZOLE (FLAGYL) IVPB 500 mg     500 mg 100 mL/hr over 60 Minutes Intravenous Every 8 hours 08/22/12 1754     08/22/12 1830  ciprofloxacin (CIPRO) IVPB 400 mg     400 mg 200 mL/hr over 60 Minutes Intravenous Every 12 hours 08/22/12 1754        Assessment/Plan: WBC now normal and no pain or tenderness over GB.  I doubt she has significant acute cholecystitis.  I agree with abx and observation without drainage for now    LOS: 4 days    HOXWORTH,BENJAMIN T 08/23/2012

## 2012-08-24 LAB — CBC
MCH: 31.3 pg (ref 26.0–34.0)
Platelets: 719 10*3/uL — ABNORMAL HIGH (ref 150–400)
RBC: 3.36 MIL/uL — ABNORMAL LOW (ref 3.87–5.11)
RDW: 14.6 % (ref 11.5–15.5)

## 2012-08-24 LAB — BASIC METABOLIC PANEL
CO2: 26 mEq/L (ref 19–32)
Calcium: 9.2 mg/dL (ref 8.4–10.5)
GFR calc Af Amer: >90 mL/min (ref >90)
GFR calc non Af Amer: 78 mL/min — ABNORMAL LOW (ref >90)
Sodium: 136 mEq/L (ref 135–145)

## 2012-08-24 NOTE — Progress Notes (Signed)
[facility] PROGRESS NOTE  Tamara Turner ZOX:[id number] DOB: August 18, 1921 DOA: 08/19/2012 PCP: Florentina Jenny, MD  Brief narrative Tamara Turner is an 79 y.o. female with hx of PVD, DVT, HTN, depression, and right lateral ankle infection recently on antibiotic, presents to the ER with abdominal pain and diarrhea. She said her diarrhea has been for three days. She felt weak. Evaluation in the ER showed platelet count of 1.8 millions, with normal other indices. She also had a CT scan of the abdomen, which showed intrahepatic dilatation to 1.8 cm, and pericolic fluid, suggestive of cholecystitis or obtructive process as in cholangiocarcinoma. She also has segment of colonic wall thickening which could represents inflammatory process vs infectious process. Hospitalist was asked to admit this patients for the above reasons.   Assessment/Plan:  1. Possible acute cholecystitis: Discussed with GI and IR  at this time patient has no abdominal pain,and they do not think that patient will benefit from percutaneous tube. Continue with  IV Cipro and Flagyl. Continue to monitor. WBC is normal today.Started on regular diet, if tolerates well can be discharged in am on po Cipro and flagyl for total 14 days.  2. Thrombocytosis, chronic: EDP discussed with Dr. Arline Asp on admission, who recommended checking JAK-2 and outpatient followup in his office in a week's time with repeat CBC. He also recommended aspirin but no hydroxyurea at this time.   3. Abdominal pain, abnormal imaging (ultrasound abdomen and CT abdomen):resolved. Secondary to? Cholecystitis, Discussed wit Dr Concha Se, patient has chronic abdominal pain and he does not think that she would benefit from perc cholecystomy tube.                   4. HTN: BP controlled  5. History of A. fib /bradycardia:Intermittent bradycardia, patient is on digoxin continue to monitor.  6. History of depression and possible anxiety: Continue home  medications   Code Status: DO NOT RESUSCITATE Family Communication: Discussed with patient's niece on phone and updated about the patient's condition.  Disposition Plan: assisted living   Consultants:  EDP discussed with Dr. Arline Asp, Oncology  [place] [facility]  General surgery  Procedures:  None  Antibiotics:  None   HPI/Subjective: Patient seen and examined, today c/o abdominal pain.  Objective: Filed Vitals:   08/23/12 1315 08/23/12 2010 08/24/12 0512 08/24/12 1400  BP: 143/64 149/72 173/68 143/59  Pulse: 69 69 80 71  Temp: 97.5 F (36.4 C) 97.4 F (36.3 C) 97.8 F (36.6 C) 97 F (36.1 C)  TempSrc: Oral Oral Axillary Oral  Resp: 20 18 20 22   Height:      Weight:   48.807 kg (107 lb 9.6 oz)   SpO2: 98% 93% 92% 100%    Intake/Output Summary (Last 24 hours) at 08/24/12 1604 Last data filed at 08/24/12 1330  Gross per 24 hour  Intake    961 ml  Output    500 ml  Net    461 ml   Filed Weights   08/22/12 0553 08/23/12 0423 08/24/12 0512  Weight: 45.995 kg (101 lb 6.4 oz) 48.444 kg (106 lb 12.8 oz) 48.807 kg (107 lb 9.6 oz)    Exam:  Physical Exam: Head: Normocephalic, atraumatic.  Eyes: No signs of jaundice, EOMI Mouth: Mucous membranes dry.  Neck: supple,No deformities, masses, or tenderness noted. Lungs: Normal respiratory effort. B/L Clear to auscultation, no crackles or wheezes.  Heart: Regular RR. S1 and S2 normal  Abdomen: BS normoactive. Soft, Nondistended, positive tender to palpation in RUQ Extremities: No  pretibial edema, no erythema    Data Reviewed: Basic Metabolic Panel:  Recent Labs Lab 08/19/12 1727 08/19/12 1751 08/20/12 0648 08/21/12 0540 08/23/12 0610 08/24/12 0600  NA 136 140  --  138 139 136  K 4.5 4.4  --  4.5 4.1 4.1  CL 96 97  --  104 106 102  CO2 32  --   --  24 25 26   GLUCOSE 104* 107*  --  108* 90 92  BUN 24* 25*  --  15 17 13   CREATININE 0.79 0.90 0.63 0.65 0.67 0.63  CALCIUM 9.8  --   --  9.7 9.3  9.2   Liver Function Tests:  Recent Labs Lab 08/19/12 1727 08/21/12 0540 08/23/12 0610  AST 26 21 19   ALT 7 6 5   ALKPHOS 133* 93 90  BILITOT 0.4 0.3 0.3  PROT 7.3 6.5 5.9*  ALBUMIN 3.8 3.3* 2.8*    Recent Labs Lab 08/19/12 1727  LIPASE 23   No results found for this basename: AMMONIA,  in the last 168 hours CBC:  Recent Labs Lab 08/19/12 1913 08/20/12 0648 08/21/12 0540 08/23/12 0610 08/24/12 0600  WBC 11.9* 12.1* 17.5* 8.4 8.5  HGB 11.4* 12.0 11.9* 10.1* 10.5*  HCT 35.5* 36.7 36.7 31.3* 32.4*  MCV 99.7 96.3 97.9 97.5 96.4  PLT 1797* 1627* 1240* 573* 719*   Cardiac Enzymes:  Recent Labs Lab 08/19/12 1727  TROPONINI <0.30   BNP (last 3 results) No results found for this basename: PROBNP,  in the last 8760 hours CBG:  Recent Labs Lab 08/19/12 1703  GLUCAP 81    Recent Results (from the past 240 hour(s))  MRSA PCR SCREENING     Status: None   Collection Time    08/20/12  6:09 AM      Result Value Range Status   MRSA by PCR NEGATIVE  NEGATIVE Final   Comment:            The GeneXpert MRSA Assay (FDA     approved for NASAL specimens     only), is one component of a     comprehensive MRSA colonization     surveillance program. It is not     intended to diagnose MRSA     infection nor to guide or     monitor treatment for     MRSA infections.     Studies: No results found.   Additional labs:   Scheduled Meds: . acidophilus  1 capsule Oral BID  . ALPRAZolam  0.25 mg Oral BID  . amLODipine  10 mg Oral Q breakfast  . aspirin  81 mg Oral Q breakfast  . ciprofloxacin  400 mg Intravenous Q12H  . collagenase   Topical Daily  . digoxin  0.125 mg Oral Q breakfast  . DULoxetine  60 mg Oral Daily  . enoxaparin (LOVENOX) injection  30 mg Subcutaneous Q24H  . feeding supplement  1 Container Oral TID BM  . gabapentin  300 mg Oral BID  . isosorbide mononitrate  60 mg Oral Q breakfast  . memantine  5 mg Oral BID  . metronidazole  500 mg  Intravenous Q8H  . mirtazapine  7.5 mg Oral QHS  . morphine  45 mg Oral TID  . pantoprazole  40 mg Oral Daily  . silver sulfADIAZINE  1 application Topical Q48H   Continuous Infusions: . dextrose 5 % and 0.9 % NaCl with KCl 20 mEq/L 20 mL/hr at 08/22/12 0542    Active  Problems:   Weight loss   Thrombocytosis   Diarrhea   Dilated intrahepatic bile duct   Dehydration, mild   Nonspecific (abnormal) findings on radiological and other examination of biliary tract   Cholecystitis, acute    Time spent:    [facility]  [facility] Pager [phone].   If 8PM-8AM, please contact night-coverage at [email or web address], password [facility] 08/24/2012, 4:04 PM  LOS: 5 days

## 2012-08-24 NOTE — Progress Notes (Signed)
Patient ID: Tamara Turner, female   DOB: 08/14/1921, 79 y.o.   MRN: [id number]    Subjective: Today she is complaining of some right-sided abdominal pain. No nausea.  Objective: Vital signs in last 24 hours: Temp:  [97.4 F (36.3 C)-97.8 F (36.6 C)] 97.8 F (36.6 C) (03/09 0512) Pulse Rate:  [69-80] 80 (03/09 0512) Resp:  [18-20] 20 (03/09 0512) BP: (143-173)/(64-72) 173/68 mmHg (03/09 0512) SpO2:  [92 %-98 %] 92 % (03/09 0512) Weight:  [107 lb 9.6 oz (48.807 kg)] 107 lb 9.6 oz (48.807 kg) (03/09 0512) Last BM Date: 08/20/12  Intake/Output from previous day: 03/08 0701 - 03/09 0700 In: 481 [P.O.:481] Out: 675 [Urine:675] Intake/Output this shift:    General appearance: alert, cooperative, no distress and chronically ill-appearing GI: soft some subjective tenderness in the right upper quadrant although no guarding with deep palpation and no palpable masses..  Lab Results:   Recent Labs  08/23/12 0610 08/24/12 0600  WBC 8.4 8.5  HGB 10.1* 10.5*  HCT 31.3* 32.4*  PLT 573* 719*   BMET  Recent Labs  08/23/12 0610 08/24/12 0600  NA 139 136  K 4.1 4.1  CL 106 102  CO2 25 26  GLUCOSE 90 92  BUN 17 13  CREATININE 0.67 0.63  CALCIUM 9.3 9.2     Studies/Results: No results found.  Anti-infectives: Anti-infectives   Start     Dose/Rate Route Frequency Ordered Stop   08/22/12 1900  metroNIDAZOLE (FLAGYL) IVPB 500 mg     500 mg 100 mL/hr over 60 Minutes Intravenous Every 8 hours 08/22/12 1754     08/22/12 1830  ciprofloxacin (CIPRO) IVPB 400 mg     400 mg 200 mL/hr over 60 Minutes Intravenous Every 12 hours 08/22/12 1754        Assessment/Plan: Gallbladder wall thickening, possible cholecystitis versus chronic thickening from other causes. She had a leukocytosis that has resolved. She has chronic abdominal pain and current complaints are difficult to evaluate. Her abdominal exam is very unimpressive. Recommend completing an empiric course of antibiotics  and observe things. If after this she continues to have significant right upper quadrant pain or recurrent leukocytosis we could consider percutaneous drainage of her gallbladder as a diagnostic and therapeutic step. She would be very high operative risk with multiple medical problems. We will sign off for now, please call if needed.    LOS: 5 days    HOXWORTH,BENJAMIN T 08/24/2012

## 2012-08-24 NOTE — Progress Notes (Signed)
[facility] Progress Note  Patient Name: Tamara Turner Date of Encounter: 08/24/2012, 12:36 PM    Subjective  Resting, no abdominal pain reported   Objective   Scheduled Meds: . acidophilus  1 capsule Oral BID  . ALPRAZolam  0.25 mg Oral BID  . amLODipine  10 mg Oral Q breakfast  . aspirin  81 mg Oral Q breakfast  . ciprofloxacin  400 mg Intravenous Q12H  . collagenase   Topical Daily  . digoxin  0.125 mg Oral Q breakfast  . DULoxetine  60 mg Oral Daily  . enoxaparin (LOVENOX) injection  30 mg Subcutaneous Q24H  . feeding supplement  1 Container Oral TID BM  . gabapentin  300 mg Oral BID  . isosorbide mononitrate  60 mg Oral Q breakfast  . memantine  5 mg Oral BID  . metronidazole  500 mg Intravenous Q8H  . mirtazapine  7.5 mg Oral QHS  . morphine  45 mg Oral TID  . pantoprazole  40 mg Oral Daily  . silver sulfADIAZINE  1 application Topical Q48H   Continuous Infusions: . dextrose 5 % and 0.9 % NaCl with KCl 20 mEq/L 20 mL/hr at 08/22/12 0542   PRN Meds:.HYDROcodone-acetaminophen, ondansetron (ZOFRAN) IV, ondansetron, polyethylene glycol   Physical Exam: Filed Vitals:   08/24/12 0512  BP: 173/68  Pulse: 80  Temp: 97.8 F (36.6 C)  Resp: 20   General: NAD Abdomen: soft and nontender, BS + Psych: pleasant and cooperative    Intake/Output Summary (Last 24 hours) at 08/24/12 1236 Last data filed at 08/24/12 0900  Gross per 24 hour  Intake    721 ml  Output    675 ml  Net     46 ml   Labs:  Recent Labs  08/23/12 0610 08/24/12 0600  NA 139 136  K 4.1 4.1  CL 106 102  CO2 25 26  GLUCOSE 90 92  BUN 17 13  CREATININE 0.67 0.63  CALCIUM 9.3 9.2    Recent Labs  08/23/12 0610 08/24/12 0600  WBC 8.4 8.5  HGB 10.1* 10.5*  HCT 31.3* 32.4*  MCV 97.5 96.4  PLT 573* 719*     Assessment and Plan  1) Abdominal pain and suspicion of cholecystitis by CT and HIDA - improved significantly on Abx 2) Chronic abdominal pain and diarrhea and  chronically dilated CHD - known from prior evaluation by me and review of records from [place] where she used to be followed - normal LFT's go against cholangiocarcinoma, etc 3) Dementia  Agree w/ GSU - treat w/ Abx x 10-14 days F/U prn  Signing off   Iva Boop, MD, [facility] [phone] (pager) 08/24/2012 12:36 PM

## 2012-08-25 NOTE — Progress Notes (Addendum)
Physical Therapy Treatment Patient Details Name: Tamara Turner MRN: [id number] DOB: Sep 10, 1921 Today's Date: 08/25/2012 Time: 0102-7253 PT Time Calculation (min): 48 min  PT Assessment / Plan / Recommendation Comments on Treatment Session  Patient not as agreeable to work today due to reports of pain and fatigue.  Did perform about the same as on evaluation per notes, but seems at risk of falling especially with incontinence.  May need increased level of care to SNF upon d/c, though still unsure of current living situation with patient report from ALF, RN reports from home with neice.    Follow Up Recommendations  SNF     Does the patient have the potential to tolerate intense rehabilitation   N/A  Barriers to Discharge  None      Equipment Recommendations  None recommended by PT    Recommendations for Other Services  N/A  Frequency Min 3X/week   Plan Discharge plan needs to be updated;Frequency remains appropriate    Precautions / Restrictions Precautions Precautions: Fall   Pertinent Vitals/Pain C/o back and right flank pain; 6/10    Mobility  Bed Mobility Bed Mobility: Sit to Supine;Rolling Right;Rolling Left Rolling Right: 4: Min assist Rolling Left: 4: Min assist Supine to Sit: 4: Min assist;HOB elevated;With rails Sitting - Scoot to Edge of Bed: 4: Min assist Sit to Supine: 5: Supervision Details for Bed Mobility Assistance: rolling for hygiene after walking back from bathroom and still incontinent; needed cues for reaching to rail and assist to roll. Transfers Sit to Stand: 4: Min assist;With upper extremity assist;From bed;From toilet Stand to Sit: 4: Min assist;With upper extremity assist;To bed;To toilet Details for Transfer Assistance: cues in bathroom to use grabbar for safety, assisted 3 times up and down to ensure no longer stooling Ambulation/Gait Ambulation/Gait Assistance: 4: Min assist Ambulation Distance (Feet): 20 Feet (x 2) Assistive device:  Rolling walker Ambulation/Gait Assistance Details: to bathroom and back due to incontinent of stool; flexed posture and c/o increased back pain out of bed; keeps walker at a distance and needs assist for safety with IV Gait Pattern: Step-through pattern;Decreased stride length;Trunk flexed     PT Goals Acute Rehab PT Goals Pt will go Supine/Side to Sit: with modified independence PT Goal: Supine/Side to Sit - Progress: Progressing toward goal Pt will go Sit to Stand: with supervision PT Goal: Sit to Stand - Progress: Progressing toward goal Pt will Ambulate: 51 - 150 feet;with least restrictive assistive device;Other (comment) PT Goal: Ambulate - Progress: Progressing toward goal  Visit Information  Last PT Received On: 08/25/12    Subjective Data  Subjective: Don't feel like getting up.  Had rough night with lots of urinating and getting help in and out.   Cognition  Cognition Overall Cognitive Status: Difficult to assess Difficult to assess due to: Other (comment) (see note below) Arousal/Alertness: Awake/alert Orientation Level: Appears intact for tasks assessed Behavior During Session: [facility] for tasks performed Cognition - Other Comments: cognition and orientation appear fine, but RN states reports in staffing and noted in chart pt from home with neice, but pt states came from [facility]F.    Balance  Static Sitting Balance Static Sitting - Balance Support: Feet unsupported;No upper extremity supported Static Sitting - Level of Assistance: 6: Modified independent (Device/Increase time) Static Sitting - Comment/# of Minutes: sat edge of bed while nursing delivering meds; flexed posture with weak trunk, but independent sitting (about 10 minutes) Static Standing Balance Static Standing - Balance Support: Bilateral upper extremity supported  Static Standing - Level of Assistance: 4: Min assist Static Standing - Comment/# of Minutes: standing for hygiene after toileting with  minguard assist and cues to keep hands on walker due to weak trunk  End of Session PT - End of Session Equipment Utilized During Treatment: Gait belt Activity Tolerance: Patient limited by pain Patient left: in bed;with bed alarm set;with call bell/phone within reach Nurse Communication: Other (comment) (incontinence)   GP     [facility] 08/25/2012, 12:45 PM Sheran Lawless, PT [phone] 08/25/2012

## 2012-08-25 NOTE — Progress Notes (Signed)
[facility] PROGRESS NOTE  Tamara Turner ZOX:[id number] DOB: [birth year] DOA: 08/19/2012 PCP: Florentina Jenny, MD  Brief narrative Tamara Turner is an 79 y.o. female with hx of PVD, DVT, HTN, depression, and right lateral ankle infection recently on antibiotic, presents to the ER with abdominal pain and diarrhea. She said her diarrhea has been for three days. She felt weak. Evaluation in the ER showed platelet count of 1.8 millions, with normal other indices. She also had a CT scan of the abdomen, which showed intrahepatic dilatation to 1.8 cm, and pericolic fluid, suggestive of cholecystitis or obtructive process as in cholangiocarcinoma. She also has segment of colonic wall thickening which could represents inflammatory process vs infectious process. Hospitalist was asked to admit this patients for the above reasons.   Assessment/Plan:  1. Possible acute cholecystitis: GI and surgery following. Poor surgical candidate. Initial plans were to place percutaneous cholecystostomy drain by IR. However over the weekend, patient was apparently doing well enough where it was decided to defer the drain and continue antibiotics only.  This morning however patient is complaining of RUQ pain radiating to the back 5/10 in intensity. She also gives history of abdominal pain prior to admission worse with by mouth intake. Discussed with surgery to reevaluate regarding need for drain at this time. Continue IV antibiotics. 2. Thrombocytosis, chronic: EDP discussed with Dr. Arline Asp on admission, who recommended checking JAK-2-not detected and outpatient followup in his office in a week's time with repeat CBC. He also recommended aspirin but no hydroxyurea at this time.  3. Abdominal pain, abnormal imaging (ultrasound abdomen and CT abdomen): Secondary to? Cholecystitis. See discussion for problem #1.               4. HTN: BP controlled 5. History of A. fib /bradycardia:Intermittent bradycardia, patient is on  digoxin continue to monitor. Metoprolol discontinued this admission. 6. History of depression and possible anxiety: Continue home medications 7. Anemia: Stable   Code Status: DO NOT RESUSCITATE Family Communication: Discussed with patient. Disposition Plan: assisted living   Consultants:  EDP discussed with Dr. Arline Asp, Oncology  [facility] gastroenterology  General surgery  Procedures:  None  Antibiotics:  IV Cipro 3/7 >  IV Flagyl 3/7 >  HPI/Subjective:  Patient complaining this morning of 5/6 RUQ pain radiating to back.  Objective: Filed Vitals:   08/25/12 0515 08/25/12 1106 08/25/12 1140 08/25/12 1415  BP: 176/75 152/55  157/63  Pulse: 83 68 74 70  Temp: 97.8 F (36.6 C) 97.8 F (36.6 C)  97.9 F (36.6 C)  TempSrc: Oral Oral  Oral  Resp: 19 20  20   Height:      Weight: 45.587 kg (100 lb 8 oz)     SpO2: 95% 94% 91% 98%    Intake/Output Summary (Last 24 hours) at 08/25/12 1526 Last data filed at 08/25/12 1504  Gross per 24 hour  Intake   1160 ml  Output   2410 ml  Net  -1250 ml   Filed Weights   08/23/12 0423 08/24/12 0512 08/25/12 0515  Weight: 48.444 kg (106 lb 12.8 oz) 48.807 kg (107 lb 9.6 oz) 45.587 kg (100 lb 8 oz)    Exam:  General exam: Appears in mild painful distress/discomfort  Respiratory system: Clear. No increased work of breathing.  Cardiovascular system: S1 & S2 heard, RRR. No JVD, murmurs, gallops, clicks or pedal edema.  Gastrointestinal system: Abdomen is nondistended and soft. Tender right upper quadrant and epigastrium without rigidity or rebound. Normal bowel sounds heard.  Central nervous system: Alert and oriented. No focal neurological deficits.  Extremities: Symmetric 5 x 5 power.    Data Reviewed: Basic Metabolic Panel:  Recent Labs Lab 08/19/12 1727 08/19/12 1751 08/20/12 0648 08/21/12 0540 08/23/12 0610 08/24/12 0600  NA 136 140  --  138 139 136  K 4.5 4.4  --  4.5 4.1 4.1  CL 96 97  --  104 106 102  CO2  32  --   --  24 25 26   GLUCOSE 104* 107*  --  108* 90 92  BUN 24* 25*  --  15 17 13   CREATININE 0.79 0.90 0.63 0.65 0.67 0.63  CALCIUM 9.8  --   --  9.7 9.3 9.2   Liver Function Tests:  Recent Labs Lab 08/19/12 1727 08/21/12 0540 08/23/12 0610  AST 26 21 19   ALT 7 6 5   ALKPHOS 133* 93 90  BILITOT 0.4 0.3 0.3  PROT 7.3 6.5 5.9*  ALBUMIN 3.8 3.3* 2.8*    Recent Labs Lab 08/19/12 1727  LIPASE 23   No results found for this basename: AMMONIA,  in the last 168 hours CBC:  Recent Labs Lab 08/19/12 1913 08/20/12 0648 08/21/12 0540 08/23/12 0610 08/24/12 0600  WBC 11.9* 12.1* 17.5* 8.4 8.5  HGB 11.4* 12.0 11.9* 10.1* 10.5*  HCT 35.5* 36.7 36.7 31.3* 32.4*  MCV 99.7 96.3 97.9 97.5 96.4  PLT 1797* 1627* 1240* 573* 719*   Cardiac Enzymes:  Recent Labs Lab 08/19/12 1727  TROPONINI <0.30   BNP (last 3 results) No results found for this basename: PROBNP,  in the last 8760 hours CBG:  Recent Labs Lab 08/19/12 1703  GLUCAP 81    Recent Results (from the past 240 hour(s))  MRSA PCR SCREENING     Status: None   Collection Time    08/20/12  6:09 AM      Result Value Range Status   MRSA by PCR NEGATIVE  NEGATIVE Final   Comment:            The GeneXpert MRSA Assay (FDA     approved for NASAL specimens     only), is one component of a     comprehensive MRSA colonization     surveillance program. It is not     intended to diagnose MRSA     infection nor to guide or     monitor treatment for     MRSA infections.     Studies: No results found.   Additional labs:   Scheduled Meds: . acidophilus  1 capsule Oral BID  . ALPRAZolam  0.25 mg Oral BID  . amLODipine  10 mg Oral Q breakfast  . aspirin  81 mg Oral Q breakfast  . ciprofloxacin  400 mg Intravenous Q12H  . collagenase   Topical Daily  . digoxin  0.125 mg Oral Q breakfast  . DULoxetine  60 mg Oral Daily  . enoxaparin (LOVENOX) injection  30 mg Subcutaneous Q24H  . feeding supplement  1  Container Oral TID BM  . gabapentin  300 mg Oral BID  . isosorbide mononitrate  60 mg Oral Q breakfast  . memantine  5 mg Oral BID  . metronidazole  500 mg Intravenous Q8H  . mirtazapine  7.5 mg Oral QHS  . morphine  45 mg Oral TID  . pantoprazole  40 mg Oral Daily  . silver sulfADIAZINE  1 application Topical Q48H   Continuous Infusions: . dextrose 5 % and 0.9 % NaCl  with KCl 20 mEq/L 20 mL/hr at 08/22/12 1610    Active Problems:   Weight loss   Thrombocytosis   Diarrhea   Dilated intrahepatic bile duct   Dehydration, mild   Nonspecific (abnormal) findings on radiological and other examination of biliary tract   Cholecystitis, acute    Time spent:    [facility]  [facility] Pager [phone].   If 8PM-8AM, please contact night-coverage at [email or web address], password [facility] 08/25/2012, 3:26 PM  LOS: 6 days

## 2012-08-25 NOTE — Progress Notes (Signed)
NUTRITION FOLLOW UP  Intervention:   1. Continue Resource Breeze  2. RD will continue to follow    Nutrition Dx:   Underweight related to increased needs as evidenced by BMI of 15. Ongoing  Goal:   PO intake to support weight gain. Unmet   Monitor:   PO intake, weight trends, I/O's  Assessment:   Diet now advanced to regular. Reports her appetite is ok, ate "some" of dinner and breakfast since diet advance. Likes the [employer], willing to continue until appetite is back to normal.  Per GI, pt with possible acalculous cholecysitits, possible per chole drain placement?   Height: Ht Readings from Last 1 Encounters:  08/20/12 5\' 8"  (1.727 m)    Weight Status:   Wt Readings from Last 1 Encounters:  08/25/12 100 lb 8 oz (45.587 kg)    Re-estimated needs:  Kcal: 1300-1500  Protein: 45-55 gm  Fluid: >/= 1.5 L/day   Skin: intact   Diet Order: General   Intake/Output Summary (Last 24 hours) at 08/25/12 1431 Last data filed at 08/25/12 1321  Gross per 24 hour  Intake   1160 ml  Output   2210 ml  Net  -1050 ml    Last BM: PTA    Labs:   Recent Labs Lab 08/21/12 0540 08/23/12 0610 08/24/12 0600  NA 138 139 136  K 4.5 4.1 4.1  CL 104 106 102  CO2 24 25 26   BUN 15 17 13   CREATININE 0.65 0.67 0.63  CALCIUM 9.7 9.3 9.2  GLUCOSE 108* 90 92    CBG (last 3)  No results found for this basename: GLUCAP,  in the last 72 hours  Scheduled Meds: . acidophilus  1 capsule Oral BID  . ALPRAZolam  0.25 mg Oral BID  . amLODipine  10 mg Oral Q breakfast  . aspirin  81 mg Oral Q breakfast  . ciprofloxacin  400 mg Intravenous Q12H  . collagenase   Topical Daily  . digoxin  0.125 mg Oral Q breakfast  . DULoxetine  60 mg Oral Daily  . enoxaparin (LOVENOX) injection  30 mg Subcutaneous Q24H  . feeding supplement  1 Container Oral TID BM  . gabapentin  300 mg Oral BID  . isosorbide mononitrate  60 mg Oral Q breakfast  . memantine  5 mg Oral BID  . metronidazole   500 mg Intravenous Q8H  . mirtazapine  7.5 mg Oral QHS  . morphine  45 mg Oral TID  . pantoprazole  40 mg Oral Daily  . silver sulfADIAZINE  1 application Topical Q48H    Continuous Infusions: . dextrose 5 % and 0.9 % NaCl with KCl 20 mEq/L 20 mL/hr at 08/22/12 0542    Clarene Duke RD, LDN Pager [phone] After Hours pager [phone]

## 2012-08-25 NOTE — Progress Notes (Signed)
Chart review complete.  Patient is not eligible for [facility] Management services because her PCP is not a [facility] primary care provider or is not [facility] affiliated.  For any additional questions or new referrals please contact Tim Henderson BSN RN [facility]iaison at [phone] °

## 2012-08-25 NOTE — Progress Notes (Signed)
I have seen and examined the patient and agrere with PA-Osborne's note Pt localizes pain to R flank/RLQ area which correlated to R renal cyst. Pt will require MRCP to eval for possible carcinoma.  I don't not think this related to cholecystitis with normal alk phos and LFTs

## 2012-08-25 NOTE — Progress Notes (Signed)
Patient ID: Tamara Turner, female   DOB: 08-20-1921, 79 y.o.   MRN: [id number]    Subjective: Pt c/o more right flank and back pain this morning to her primary doctor.  She did not have worsening pain with her clear liquids and so far today not with her solid food, although she didn't eat very much she states because she doesn't feel well.  She does however, state that, I believe, prior to this admission she would get abdominal pain after eating a lot of times.  She states her pain today is worse than yesterday.  Objective: Vital signs in last 24 hours: Temp:  [97 F (36.1 C)-98.3 F (36.8 C)] 97.8 F (36.6 C) (03/10 1106) Pulse Rate:  [68-83] 68 (03/10 1106) Resp:  [19-22] 20 (03/10 1106) BP: (143-176)/(55-75) 152/55 mmHg (03/10 1106) SpO2:  [94 %-100 %] 94 % (03/10 1106) Weight:  [100 lb 8 oz (45.587 kg)] 100 lb 8 oz (45.587 kg) (03/10 0515) Last BM Date: 08/22/12  Intake/Output from previous day: 03/09 0701 - 03/10 0700 In: 1680 [P.O.:960; I.V.:120; IV Piggyback:600] Out: 1900 [Urine:1900] Intake/Output this shift: Total I/O In: 120 [P.O.:120] Out: 610 [Urine:610]  PE: Abd: soft, mild RUQ tenderness, more tender along right flank, +BS, ND, NT elsewhere in her abdomen.  Lab Results:   Recent Labs  08/23/12 0610 08/24/12 0600  WBC 8.4 8.5  HGB 10.1* 10.5*  HCT 31.3* 32.4*  PLT 573* 719*   BMET  Recent Labs  08/23/12 0610 08/24/12 0600  NA 139 136  K 4.1 4.1  CL 106 102  CO2 25 26  GLUCOSE 90 92  BUN 17 13  CREATININE 0.67 0.63  CALCIUM 9.3 9.2   PT/INR  Recent Labs  08/23/12 0610  LABPROT 16.9*  INR 1.41   CMP     Component Value Date/Time   NA 136 08/24/2012 0600   K 4.1 08/24/2012 0600   CL 102 08/24/2012 0600   CO2 26 08/24/2012 0600   GLUCOSE 92 08/24/2012 0600   BUN 13 08/24/2012 0600   CREATININE 0.63 08/24/2012 0600   CALCIUM 9.2 08/24/2012 0600   PROT 5.9* 08/23/2012 0610   ALBUMIN 2.8* 08/23/2012 0610   AST 19 08/23/2012 0610   ALT 5 08/23/2012 0610    ALKPHOS 90 08/23/2012 0610   BILITOT 0.3 08/23/2012 0610   GFRNONAA 78* 08/24/2012 0600   GFRAA >90 08/24/2012 0600   Lipase     Component Value Date/Time   LIPASE 23 08/19/2012 1727       Studies/Results: No results found.  Anti-infectives: Anti-infectives   Start     Dose/Rate Route Frequency Ordered Stop   08/22/12 1900  metroNIDAZOLE (FLAGYL) IVPB 500 mg     500 mg 100 mL/hr over 60 Minutes Intravenous Every 8 hours 08/22/12 1754     08/22/12 1830  ciprofloxacin (CIPRO) IVPB 400 mg     400 mg 200 mL/hr over 60 Minutes Intravenous Every 12 hours 08/22/12 1754         Assessment/Plan  1. Possible acalculous cholecystitis 2. Multiple other medical problems   Plan: 1. The patient has findings c/w possible chronic acalculous cholecystitis, however, he most recent CT scan questions a large hepatic duct of 1.8cm c/w some type of obstruction.  Cholangiocarcinoma could not be definitively ruled out.  MRCP was recommended but not done.  Last MRCP was in 10/13.  I will d/w Dr. Derrell Lolling and have him review the case to make sure prior to proceeding with a  perc chole drain that the patient does not need a new MRCP to rule out another source of her pain.  Will follow.  LOS: 6 days    OSBORNE,KELLY E 08/25/2012, 11:31 AM Pager: [phone]

## 2012-08-26 ENCOUNTER — Inpatient Hospital Stay (HOSPITAL_COMMUNITY): Payer: Medicare Other

## 2012-08-26 MED ORDER — GADOBENATE DIMEGLUMINE 529 MG/ML IV SOLN
10.0000 mL | Freq: Once | INTRAVENOUS | Status: AC
  Administered 2012-08-26: 10 mL via INTRAVENOUS

## 2012-08-26 NOTE — Progress Notes (Signed)
[facility] PROGRESS NOTE  Tylicia Sherman GNF:[id number] DOB: 1922-01-09 DOA: 08/19/2012 PCP: Florentina Jenny, MD  Brief narrative Tamara Turner is an 79 y.o. female with hx of PVD, DVT, HTN, depression, and right lateral ankle infection recently on antibiotic, presents to the ER with abdominal pain and diarrhea. She said her diarrhea has been for three days. She felt weak. Evaluation in the ER showed platelet count of 1.8 millions, with normal other indices. She also had a CT scan of the abdomen, which showed intrahepatic dilatation to 1.8 cm, and pericolic fluid, suggestive of cholecystitis or obtructive process as in cholangiocarcinoma. She also has segment of colonic wall thickening which could represents inflammatory process vs infectious process. Hospitalist was asked to admit this patients for the above reasons.   Assessment/Plan:  1. Diarrhea, on admission: Possibly acute viral gastroenteritis. Resolved.   2. New onset abdominal pain: In the context of abnormal imaging (CT abdomen and abdominal ultrasound). Patient is a difficult and inconsistent historian. [facility] was consulted and performed HIDA scan which was suggestive of acute cholecystitis. Surgeons were consulted and determined that patient was a poor surgical candidate and initially recommended percutaneous cholecystostomy drain by IR and antibiotics. Subsequently they advised no drainage and only antibiotics. Patient's abdominal pain seemed to get worse. Surgeon's followed and performed MRCP which did not show lesion suspicious for cholangiocarcinoma or objective evidence of cholecystitis. Thereby, surgery  indicates no need for cholecystostomy tube or surgery.  Please discuss with surgery on 3/12 if any need for further Abx.  3. Thrombocytosis, chronic: EDP discussed with Dr. Arline Asp on admission, who recommended checking JAK-2-not detected and outpatient followup in his office in a week's time with repeat CBC. He also  recommended aspirin but no hydroxyurea at this time.   4. Abnormal imaging (ultrasound abdomen and CT abdomen): Discussion per problem #2. Many of the imaging findings are chronic.         5. HTN: BP controlled  6. History of A. fib /bradycardia:Intermittent bradycardia, patient is on digoxin continue to monitor. Metoprolol discontinued this admission. Bradycardia improved  7. History of depression and possible anxiety: Continue home medications  8. Anemia: Stable  9. Underweight, BMI 15.15   Code Status: DO NOT RESUSCITATE Family Communication: Discussed with patient. Disposition Plan: SNF when bed available.   Consultants:  EDP discussed with Dr. Arline Asp, Oncology  [facility] gastroenterology  General surgery  Procedures:  None  Antibiotics:  IV Cipro 3/7 >  IV Flagyl 3/7 >  HPI/Subjective: Patient denies abdominal pain today.  Objective: Filed Vitals:   08/25/12 2051 08/26/12 0550 08/26/12 1149 08/26/12 1500  BP: 154/56  137/51 127/65  Pulse: 74 91 77 72  Temp: 97.8 F (36.6 C) 97.5 F (36.4 C) 97.8 F (36.6 C) 97.6 F (36.4 C)  TempSrc: Oral Oral Oral Oral  Resp: 20 20 20 20   Height:      Weight:  48.988 kg (108 lb)    SpO2: 95% 100% 96% 97%    Intake/Output Summary (Last 24 hours) at 08/26/12 1741 Last data filed at 08/26/12 1500  Gross per 24 hour  Intake    600 ml  Output    725 ml  Net   -125 ml   Filed Weights   08/24/12 0512 08/25/12 0515 08/26/12 0550  Weight: 48.807 kg (107 lb 9.6 oz) 45.587 kg (100 lb 8 oz) 48.988 kg (108 lb)    Exam:  General exam: Comfortable. Respiratory system: Clear. No increased work of breathing.  Cardiovascular  system: S1 & S2 heard, RRR. No JVD, murmurs, gallops, clicks or pedal edema. Telemetry shows atrial fibrillation with ventricular rate mostly in the 50s to 70s.  Gastrointestinal system: Abdomen is nondistended and soft. Tender right upper quadrant and epigastrium without rigidity or rebound. Normal  bowel sounds heard. Central nervous system: Alert and oriented. No focal neurological deficits.  Extremities: Symmetric 5 x 5 power.    Data Reviewed: Basic Metabolic Panel:  Recent Labs Lab 08/19/12 1751 08/20/12 0648 08/21/12 0540 08/23/12 0610 08/24/12 0600  NA 140  --  138 139 136  K 4.4  --  4.5 4.1 4.1  CL 97  --  104 106 102  CO2  --   --  24 25 26   GLUCOSE 107*  --  108* 90 92  BUN 25*  --  15 17 13   CREATININE 0.90 0.63 0.65 0.67 0.63  CALCIUM  --   --  9.7 9.3 9.2   Liver Function Tests:  Recent Labs Lab 08/21/12 0540 08/23/12 0610  AST 21 19  ALT 6 5  ALKPHOS 93 90  BILITOT 0.3 0.3  PROT 6.5 5.9*  ALBUMIN 3.3* 2.8*   No results found for this basename: LIPASE, AMYLASE,  in the last 168 hours No results found for this basename: AMMONIA,  in the last 168 hours CBC:  Recent Labs Lab 08/19/12 1913 08/20/12 0648 08/21/12 0540 08/23/12 0610 08/24/12 0600  WBC 11.9* 12.1* 17.5* 8.4 8.5  HGB 11.4* 12.0 11.9* 10.1* 10.5*  HCT 35.5* 36.7 36.7 31.3* 32.4*  MCV 99.7 96.3 97.9 97.5 96.4  PLT 1797* 1627* 1240* 573* 719*   Cardiac Enzymes: No results found for this basename: CKTOTAL, CKMB, CKMBINDEX, TROPONINI,  in the last 168 hours BNP (last 3 results) No results found for this basename: PROBNP,  in the last 8760 hours CBG: No results found for this basename: GLUCAP,  in the last 168 hours  Recent Results (from the past 240 hour(s))  MRSA PCR SCREENING     Status: None   Collection Time    08/20/12  6:09 AM      Result Value Range Status   MRSA by PCR NEGATIVE  NEGATIVE Final   Comment:            The GeneXpert MRSA Assay (FDA     approved for NASAL specimens     only), is one component of a     comprehensive MRSA colonization     surveillance program. It is not     intended to diagnose MRSA     infection nor to guide or     monitor treatment for     MRSA infections.     Studies: Mr 3d Recon At Scanner  09/03/12  *RADIOLOGY REPORT*   Clinical Data:  Abdominal pain with biliary dilatation.  Evaluate for biliary obstruction, neoplasm or cholecystitis.  MRI ABDOMEN WITHOUT AND WITH CONTRAST (INCLUDING MRCP)  Technique:  Multiplanar multisequence MR imaging of the abdomen was performed both before and after the administration of intravenous contrast. Heavily T2-weighted images of the biliary and pancreatic ducts were obtained, and three-dimensional MRCP images were rendered by post processing.  Contrast: 10mL MULTIHANCE GADOBENATE DIMEGLUMINE 529 MG/ML IV SOLN  Comparison:  Abdominal ultrasound and CT 08/19/2012.  MRCP 04/24/2012.  Hepatobiliary scan 08/21/2012.  Findings:  The study is moderately degraded by motion on this inpatient who had difficulty suspending respiration especially towards the end of the study which includes the post contrast images.  Again demonstrated is marked atrophy of the left hepatic lobe with moderate irregular intrahepatic biliary dilatation.  There is no significant intrahepatic biliary dilatation within the right hepatic lobe.  The extrahepatic biliary system is moderately dilated, measuring up to 1.5 cm in diameter, similar to prior MRCP. There is no evidence of choledocholithiasis.  Distension of the gallbladder appears stable.  No gallbladder wall thickening or cholelithiasis is identified.  There is a stable small cyst inferiorly in the right hepatic lobe. No suspicious hepatic enhancement is identified.  Mild splenomegaly appears stable.  There is stable distortion of the pancreas and pancreatic atrophy.  No pancreatic mass or pancreatic ductal dilatation is identified.  Again demonstrated are multiple large renal cysts bilaterally. Some of the right-sided cysts project anteriorly near the ampulla but appear unchanged.  There are complex bilateral pleural effusions with associated bibasilar atelectasis. There is mild diffuse soft tissue edema.  IMPRESSION:  1.  Extrahepatic and left intrahepatic biliary  dilatation is similar to the prior MRCP and likely longstanding, but of undetermined etiology.  No evidence of choledocholithiasis or pancreatic mass. 2. Stable atrophy of the left hepatic lobe without suspicious enhancement to suggest a central cholangiocarcinoma. 3.  No objective evidence of cholecystitis demonstrated.  However, patency of the cystic duct was not demonstrated on recent hepatobiliary scan. 4.  Multiple large bilateral renal cysts again noted. 5.  Anasarca with enlarging bilateral pleural effusions.   Original Report Authenticated By: Carey Bullocks, M.D.    Mr Abd W/wo Cm/mrcp  08/26/2012  *RADIOLOGY REPORT*  Clinical Data:  Abdominal pain with biliary dilatation.  Evaluate for biliary obstruction, neoplasm or cholecystitis.  MRI ABDOMEN WITHOUT AND WITH CONTRAST (INCLUDING MRCP)  Technique:  Multiplanar multisequence MR imaging of the abdomen was performed both before and after the administration of intravenous contrast. Heavily T2-weighted images of the biliary and pancreatic ducts were obtained, and three-dimensional MRCP images were rendered by post processing.  Contrast: 10mL MULTIHANCE GADOBENATE DIMEGLUMINE 529 MG/ML IV SOLN  Comparison:  Abdominal ultrasound and CT 08/19/2012.  MRCP 04/24/2012.  Hepatobiliary scan 08/21/2012.  Findings:  The study is moderately degraded by motion on this inpatient who had difficulty suspending respiration especially towards the end of the study which includes the post contrast images.  Again demonstrated is marked atrophy of the left hepatic lobe with moderate irregular intrahepatic biliary dilatation.  There is no significant intrahepatic biliary dilatation within the right hepatic lobe.  The extrahepatic biliary system is moderately dilated, measuring up to 1.5 cm in diameter, similar to prior MRCP. There is no evidence of choledocholithiasis.  Distension of the gallbladder appears stable.  No gallbladder wall thickening or cholelithiasis is  identified.  There is a stable small cyst inferiorly in the right hepatic lobe. No suspicious hepatic enhancement is identified.  Mild splenomegaly appears stable.  There is stable distortion of the pancreas and pancreatic atrophy.  No pancreatic mass or pancreatic ductal dilatation is identified.  Again demonstrated are multiple large renal cysts bilaterally. Some of the right-sided cysts project anteriorly near the ampulla but appear unchanged.  There are complex bilateral pleural effusions with associated bibasilar atelectasis. There is mild diffuse soft tissue edema.  IMPRESSION:  1.  Extrahepatic and left intrahepatic biliary dilatation is similar to the prior MRCP and likely longstanding, but of undetermined etiology.  No evidence of choledocholithiasis or pancreatic mass. 2. Stable atrophy of the left hepatic lobe without suspicious enhancement to suggest a central cholangiocarcinoma. 3.  No objective evidence of cholecystitis demonstrated.  However, patency of the cystic duct was not demonstrated on recent hepatobiliary scan. 4.  Multiple large bilateral renal cysts again noted. 5.  Anasarca with enlarging bilateral pleural effusions.   Original Report Authenticated By: Carey Bullocks, M.D.      Additional labs:   Scheduled Meds: . acidophilus  1 capsule Oral BID  . ALPRAZolam  0.25 mg Oral BID  . amLODipine  10 mg Oral Q breakfast  . aspirin  81 mg Oral Q breakfast  . ciprofloxacin  400 mg Intravenous Q12H  . collagenase   Topical Daily  . digoxin  0.125 mg Oral Q breakfast  . DULoxetine  60 mg Oral Daily  . enoxaparin (LOVENOX) injection  30 mg Subcutaneous Q24H  . feeding supplement  1 Container Oral TID BM  . gabapentin  300 mg Oral BID  . isosorbide mononitrate  60 mg Oral Q breakfast  . memantine  5 mg Oral BID  . metronidazole  500 mg Intravenous Q8H  . mirtazapine  7.5 mg Oral QHS  . morphine  45 mg Oral TID  . pantoprazole  40 mg Oral Daily  . silver sulfADIAZINE  1  application Topical Q48H   Continuous Infusions: . dextrose 5 % and 0.9 % NaCl with KCl 20 mEq/L 20 mL/hr at 08/22/12 7846    Active Problems:   Weight loss   Thrombocytosis   Diarrhea   Dilated intrahepatic bile duct   Dehydration, mild   Nonspecific (abnormal) findings on radiological and other examination of biliary tract   Cholecystitis, acute    Time spent:    [facility]  [facility] Pager [phone].   If 8PM-8AM, please contact night-coverage at [email or web address], password [facility] 08/26/2012, 5:41 PM  LOS: 7 days

## 2012-08-26 NOTE — Clinical Documentation Improvement (Signed)
BMI DOCUMENTATION CLARIFICATION QUERY  CLINICAL DOCUMENTATION QUERIES ARE NOT PART OF THE PERMANENT MEDICAL RECORD          08/26/12  Dr. Waymon Amato and/or Associate,  In an effort to better capture your patient's severity of illness, reflect appropriate length of stay and utilization of resources, a review of the patient medical record has revealed the following indicators:  Progress Notes signed by Tonye Becket, RD at 08/20/2012 10:07 AM INITIAL NUTRITION ASSESSMENT DOCUMENTATION CODES  Per approved criteria   -Underweight   INTERVENTION:  1. Resource Breeze po TID, each supplement provides 250 kcal and 9 grams of protein.  2. Change to Ensure Complete once diet is able to advance  3. RD will continue to follow  NUTRITION DIAGNOSIS:  Underweight related to increased needs as evidenced by Body mass index is 15.51 kg/(m^2). Marland Kitchen  Goal:  PO intake to support weight gain Reason for Assessment: Health History  79 y.o. female  Admitting Dx:  ASSESSMENT:  Pt admitted with diarrhea and weakness.  RD pulled to pt for low BMI, problem list includes weight loss.  Per pt weight hx, pt weight has been stable, 102 lbs appears to be her usual weight. Pt is unsure of what she normally weighs. Endorses weight loss in the past, unsure of time frame or amount lost. Pt states she eats well at her ALF, like the food. Requesting breakfast at this time, RD explained clear liquid diet but pt continued to ask for breakfast. Does drink Ensure on occasion. Currently on Clears, will add resource breeze, when diet is advanced can be changed to Ensure.  Pt likely with some level of malnutrition given chronic underweight status.  BMI: Body mass index is 15.51 kg/(m^2). Underweight    Progress Notes signed by Tonye Becket, RD at 08/25/2012 2:35 PM NUTRITION FOLLOW UP Intervention:  1. Continue Resource Breeze  2. RD will continue to follow  Nutrition Dx:  Underweight related to increased needs as  evidenced by BMI of 15. Ongoing  Goal:  PO intake to support weight gain. Unmet  Monitor:  PO intake, weight trends, I/O's  Assessment:  Diet now advanced to regular. Reports her appetite is ok, ate "some" of dinner and breakfast since diet advance. Likes the [employer], willing to continue until appetite is back to normal.  Per GI, pt with possible acalculous cholecysitits, possible per chole drain placement?     Based on your clinical judgment, please document in the progress notes and discharge summary if a condition below provides greater specificity regarding the patient's nutritional status:   - Underweight, BMI 15.15   - Other Condition   - Unable to Clinically Determine   In responding to this query please exercise your independent judgment.    The fact that a query is asked, does not imply that any particular answer is desired or expected.    Reviewed: 08/27/12 - "underweight, BMI 15.15" documented in pn 08/26/12.  Mathis Dad RN  Thank You,  Jerral Ralph RN BSN CCDS Certified Clinical Documentation Specialist: Cell   [phone]  Health Information Management [place]   TO RESPOND TO THE THIS QUERY, FOLLOW THE INSTRUCTIONS BELOW:  1. If needed, update documentation for the patient's encounter via the notes activity.  2. Access this query again and click edit on the In [employer].  3. After updating, or not, click F2 to complete all highlighted (required) fields concerning your review. Select "additional documentation in the medical record" OR "no  additional documentation provided".  4. Click Sign note button.  5. The deficiency will fall out of your In Basket *Please let us know if you are not able to complete this workflow by phone or e-mail (listed below).

## 2012-08-26 NOTE — Progress Notes (Signed)
I have seen and examined the patient and agree with PA-Osborne's note MRCP today to look for possible pancreatic vs cholangioca. In speaking with the pt if CA is found she would not want surgery at this time. Will follow up MRCP.

## 2012-08-26 NOTE — Progress Notes (Signed)
Patient ID: Tamara Turner, female   DOB: 1922-01-07, 79 y.o.   MRN: [id number]    Subjective: Pt denies abdominal pain today.  She just c/o neck and back pain.  Objective: Vital signs in last 24 hours: Temp:  [97.5 F (36.4 C)-97.9 F (36.6 C)] 97.5 F (36.4 C) (03/11 0550) Pulse Rate:  [68-91] 91 (03/11 0550) Resp:  [20] 20 (03/11 0550) BP: (152-157)/(55-63) 154/56 mmHg (03/10 2051) SpO2:  [91 %-100 %] 100 % (03/11 0550) Weight:  [108 lb (48.988 kg)] 108 lb (48.988 kg) (03/11 0550) Last BM Date: 08/25/12  Intake/Output from previous day: 03/10 0701 - 03/11 0700 In: 840 [P.O.:840] Out: 1235 [Urine:1235] Intake/Output this shift:    PE: Abd: soft, NT, ND, +BS  Lab Results:   Recent Labs  08/24/12 0600  WBC 8.5  HGB 10.5*  HCT 32.4*  PLT 719*   BMET  Recent Labs  08/24/12 0600  NA 136  K 4.1  CL 102  CO2 26  GLUCOSE 92  BUN 13  CREATININE 0.63  CALCIUM 9.2   PT/INR No results found for this basename: LABPROT, INR,  in the last 72 hours CMP     Component Value Date/Time   NA 136 08/24/2012 0600   K 4.1 08/24/2012 0600   CL 102 08/24/2012 0600   CO2 26 08/24/2012 0600   GLUCOSE 92 08/24/2012 0600   BUN 13 08/24/2012 0600   CREATININE 0.63 08/24/2012 0600   CALCIUM 9.2 08/24/2012 0600   PROT 5.9* 08/23/2012 0610   ALBUMIN 2.8* 08/23/2012 0610   AST 19 08/23/2012 0610   ALT 5 08/23/2012 0610   ALKPHOS 90 08/23/2012 0610   BILITOT 0.3 08/23/2012 0610   GFRNONAA 78* 08/24/2012 0600   GFRAA >90 08/24/2012 0600   Lipase     Component Value Date/Time   LIPASE 23 08/19/2012 1727       Studies/Results: No results found.  Anti-infectives: Anti-infectives   Start     Dose/Rate Route Frequency Ordered Stop   08/22/12 1900  metroNIDAZOLE (FLAGYL) IVPB 500 mg     500 mg 100 mL/hr over 60 Minutes Intravenous Every 8 hours 08/22/12 1754     08/22/12 1830  ciprofloxacin (CIPRO) IVPB 400 mg     400 mg 200 mL/hr over 60 Minutes Intravenous Every 12 hours 08/22/12 1754          Assessment/Plan  1. Abdominal pain, ? Cholecystitis vs possible neoplasm (per CT) 2. Multiple other medical problems  Plan: 1. Will proceed with an MRCP today to better evaluate this area in the biliary tree that may be causing the hepatic duct to be so dilated.  This is c/w some obstructive process on CT scan.  If this is cholangiocarcinoma, sometimes due to the small size, it's difficult to pick up on MRCP.  She ultimately may need an ERCP with brushings if there is a high suspicion on MRCP.   2. If there are no findings to further suggestion carcinoma, given the patient is pain-free today with no fever, no WBC, I'm not convinced that a perc chole drain is going to be useful in this patient.  We will cont to follow and make further recommendations as more info is available.   LOS: 7 days    OSBORNE,KELLY E 08/26/2012, 8:44 AM Pager: [phone]

## 2012-08-26 NOTE — Progress Notes (Signed)
MRCP with objective signs of acute cholecytitis or cholangioca.   Pt asymptomatic today.  Pt's pain complaints correlate with large renal cyst in RLQ adjacent to abd wall.  No need for cholecystostomy tube or needs for surgery in the light of normal LFTs.  Please call back if any questions.

## 2012-08-27 LAB — CREATININE, SERUM
Creatinine, Ser: 0.75 mg/dL (ref 0.50–1.10)
GFR calc non Af Amer: 73 mL/min — ABNORMAL LOW (ref >90)

## 2012-08-27 NOTE — Progress Notes (Signed)
[facility] PROGRESS NOTE  Tamara Turner AVW:[id number] DOB: 1921/10/25 DOA: 08/19/2012 PCP: Florentina Jenny, MD  Assessment/Plan: Active Problems:   Weight loss   Thrombocytosis   Diarrhea   Dilated intrahepatic bile duct   Dehydration, mild   Nonspecific (abnormal) findings on radiological and other examination of biliary tract   Cholecystitis, acute   1. Diarrhea, on admission: Patient had a brief episode of diarrhea, which proved to be self-limited. This was possibly and acute viral gastroenteritis. Resolved. 2. New onset abdominal pain: Patient complained of upper abdominal pain, mostly following meals. In the context of abnormal imaging (CT abdomen and abdominal ultrasound), [facility] was consulted and Dr Melvia Heaps provided a consultation. HIDA scan was suggestive of acute cholecystitis, necessitating a surgical consultation, which was provided by Dr Chipper Herb. Per surgeon, patient was a poor surgical candidate and initial recommendation, was for  percutaneous cholecystostomy drain by IR/antibiotics. Subsequently the surgical team have opted for no drainage and only antibiotics. MRCP which did not show lesion suspicious for cholangiocarcinoma or objective evidence of cholecystitis. Patient feels much better, as of 08/27/12, she remains afebrile and wcc has normalized. 3. Thrombocytosis, chronic/Anemia: Patient had a markedly elevated platelet count of 1892 at presentation. EDP discussed with Dr. Arline Asp on admission, who recommended checking JAK-2-not detected and outpatient followup in his office in a week's time with repeat CBC. He also recommended Aspirin but no Hydroxyurea at this time. Hb is stable.  4. HTN: BP is controlled at this time. 5. History of A. Fib/Bradycardia: During this hospitalization, patient had intermittent bradycardia. Metoprolol was discontinued, with resolution. Patient continues on Digoxin. 6. History of depression/Anxiety: Stable on  pre-admission psychotropics.  7. Deconditioning: patient appears clinically deconditioned. Will consult PT/OT.   Code Status: DNR Family Communication:  Disposition Plan: To be determined.    Brief narrative: 79 y.o. female with hx of PVD, DVT, HTN, depression, and right lateral ankle infection recently on antibiotic, presenting to the ED with abdominal pain and diarrhea. She said her diarrhea had been going on for three days. She felt weak. Evaluation in the ER showed platelet count of 1.8 millions, with normal other indices. She also had a CT scan of the abdomen, which showed intrahepatic dilatation to 1.8 cm, and pericolic fluid, suggestive of cholecystitis or obtructive process as in cholangiocarcinoma. She also has segment of colonic wall thickening which could represents inflammatory process vs infectious process. Hospitalist was asked to admit this patients for the above reasons.   Consultants:  Dr Axel Filler, Surgeon.   Procedures:  CT Abdomen/Pelvis.   HIDA Scan.  MRCP.   Antibiotics:  Ciprofloxacin 08/22/12>>>  Flagyl 08/22/12>>>  HPI/Subjective: Feels much better. Only mild abdominal pain, after meals.   Objective: Vital signs in last 24 hours: Temp:  [97.8 F (36.6 C)-98 F (36.7 C)] 97.8 F (36.6 C) (03/12 0421) Pulse Rate:  [70-71] 71 (03/12 0421) Resp:  [18-20] 20 (03/12 0421) BP: (116-146)/(46-66) 146/66 mmHg (03/12 0421) SpO2:  [95 %-97 %] 95 % (03/12 1348) Weight:  [48.807 kg (107 lb 9.6 oz)] 48.807 kg (107 lb 9.6 oz) (03/12 0421) Weight change: -0.181 kg (-6.4 oz) Last BM Date: 08/25/12  Intake/Output from previous day: 03/11 0701 - 03/12 0700 In: 480 [P.O.:480] Out: 1100 [Urine:1100] Total I/O In: 480 [P.O.:480] Out: 300 [Urine:300]   Physical Exam: General: Comfortable, alert, communicative, fully oriented, not short of breath at rest.  HEENT:  Mild clinical pallor, no jaundice, no conjunctival injection or discharge. NECK:  Supple, JVP  not seen, no carotid bruits, no palpable lymphadenopathy, no palpable goiter. CHEST:  Clinically clear to auscultation, no wheezes, no crackles. HEART:  Sounds 1 and 2 heard, normal, regular, no murmurs. ABDOMEN:  Full, soft, non-tender, no palpable organomegaly, no palpable masses, normal bowel sounds. GENITALIA:  Not examined. LOWER EXTREMITIES:  No pitting edema, palpable peripheral pulses. MUSCULOSKELETAL SYSTEM:  Generalized osteoarthritic changes, otherwise, normal. CENTRAL NERVOUS SYSTEM:  No focal neurologic deficit on gross examination.  Lab Results: No results found for this basename: WBC, HGB, HCT, PLT,  in the last 72 hours  Recent Labs  08/27/12 0614  CREATININE 0.75   Recent Results (from the past 240 hour(s))  MRSA PCR SCREENING     Status: None   Collection Time    08/20/12  6:09 AM      Result Value Range Status   MRSA by PCR NEGATIVE  NEGATIVE Final   Comment:            The GeneXpert MRSA Assay (FDA     approved for NASAL specimens     only), is one component of a     comprehensive MRSA colonization     surveillance program. It is not     intended to diagnose MRSA     infection nor to guide or     monitor treatment for     MRSA infections.     Studies/Results: Mr 3d Recon At Scanner  08/26/2012  *RADIOLOGY REPORT*  Clinical Data:  Abdominal pain with biliary dilatation.  Evaluate for biliary obstruction, neoplasm or cholecystitis.  MRI ABDOMEN WITHOUT AND WITH CONTRAST (INCLUDING MRCP)  Technique:  Multiplanar multisequence MR imaging of the abdomen was performed both before and after the administration of intravenous contrast. Heavily T2-weighted images of the biliary and pancreatic ducts were obtained, and three-dimensional MRCP images were rendered by post processing.  Contrast: 10mL MULTIHANCE GADOBENATE DIMEGLUMINE 529 MG/ML IV SOLN  Comparison:  Abdominal ultrasound and CT 08/19/2012.  MRCP 04/24/2012.  Hepatobiliary scan 08/21/2012.  Findings:  The study  is moderately degraded by motion on this inpatient who had difficulty suspending respiration especially towards the end of the study which includes the post contrast images.  Again demonstrated is marked atrophy of the left hepatic lobe with moderate irregular intrahepatic biliary dilatation.  There is no significant intrahepatic biliary dilatation within the right hepatic lobe.  The extrahepatic biliary system is moderately dilated, measuring up to 1.5 cm in diameter, similar to prior MRCP. There is no evidence of choledocholithiasis.  Distension of the gallbladder appears stable.  No gallbladder wall thickening or cholelithiasis is identified.  There is a stable small cyst inferiorly in the right hepatic lobe. No suspicious hepatic enhancement is identified.  Mild splenomegaly appears stable.  There is stable distortion of the pancreas and pancreatic atrophy.  No pancreatic mass or pancreatic ductal dilatation is identified.  Again demonstrated are multiple large renal cysts bilaterally. Some of the right-sided cysts project anteriorly near the ampulla but appear unchanged.  There are complex bilateral pleural effusions with associated bibasilar atelectasis. There is mild diffuse soft tissue edema.  IMPRESSION:  1.  Extrahepatic and left intrahepatic biliary dilatation is similar to the prior MRCP and likely longstanding, but of undetermined etiology.  No evidence of choledocholithiasis or pancreatic mass. 2. Stable atrophy of the left hepatic lobe without suspicious enhancement to suggest a central cholangiocarcinoma. 3.  No objective evidence of cholecystitis demonstrated.  However, patency of the cystic duct was not demonstrated on recent  hepatobiliary scan. 4.  Multiple large bilateral renal cysts again noted. 5.  Anasarca with enlarging bilateral pleural effusions.   Original Report Authenticated By: Carey Bullocks, M.D.    Mr Abd W/wo Cm/mrcp  08/26/2012  *RADIOLOGY REPORT*  Clinical Data:  Abdominal pain  with biliary dilatation.  Evaluate for biliary obstruction, neoplasm or cholecystitis.  MRI ABDOMEN WITHOUT AND WITH CONTRAST (INCLUDING MRCP)  Technique:  Multiplanar multisequence MR imaging of the abdomen was performed both before and after the administration of intravenous contrast. Heavily T2-weighted images of the biliary and pancreatic ducts were obtained, and three-dimensional MRCP images were rendered by post processing.  Contrast: 10mL MULTIHANCE GADOBENATE DIMEGLUMINE 529 MG/ML IV SOLN  Comparison:  Abdominal ultrasound and CT 08/19/2012.  MRCP 04/24/2012.  Hepatobiliary scan 08/21/2012.  Findings:  The study is moderately degraded by motion on this inpatient who had difficulty suspending respiration especially towards the end of the study which includes the post contrast images.  Again demonstrated is marked atrophy of the left hepatic lobe with moderate irregular intrahepatic biliary dilatation.  There is no significant intrahepatic biliary dilatation within the right hepatic lobe.  The extrahepatic biliary system is moderately dilated, measuring up to 1.5 cm in diameter, similar to prior MRCP. There is no evidence of choledocholithiasis.  Distension of the gallbladder appears stable.  No gallbladder wall thickening or cholelithiasis is identified.  There is a stable small cyst inferiorly in the right hepatic lobe. No suspicious hepatic enhancement is identified.  Mild splenomegaly appears stable.  There is stable distortion of the pancreas and pancreatic atrophy.  No pancreatic mass or pancreatic ductal dilatation is identified.  Again demonstrated are multiple large renal cysts bilaterally. Some of the right-sided cysts project anteriorly near the ampulla but appear unchanged.  There are complex bilateral pleural effusions with associated bibasilar atelectasis. There is mild diffuse soft tissue edema.  IMPRESSION:  1.  Extrahepatic and left intrahepatic biliary dilatation is similar to the prior MRCP  and likely longstanding, but of undetermined etiology.  No evidence of choledocholithiasis or pancreatic mass. 2. Stable atrophy of the left hepatic lobe without suspicious enhancement to suggest a central cholangiocarcinoma. 3.  No objective evidence of cholecystitis demonstrated.  However, patency of the cystic duct was not demonstrated on recent hepatobiliary scan. 4.  Multiple large bilateral renal cysts again noted. 5.  Anasarca with enlarging bilateral pleural effusions.   Original Report Authenticated By: Carey Bullocks, M.D.     Medications: Scheduled Meds: . acidophilus  1 capsule Oral BID  . ALPRAZolam  0.25 mg Oral BID  . amLODipine  10 mg Oral Q breakfast  . aspirin  81 mg Oral Q breakfast  . ciprofloxacin  400 mg Intravenous Q12H  . collagenase   Topical Daily  . digoxin  0.125 mg Oral Q breakfast  . DULoxetine  60 mg Oral Daily  . enoxaparin (LOVENOX) injection  30 mg Subcutaneous Q24H  . feeding supplement  1 Container Oral TID BM  . gabapentin  300 mg Oral BID  . isosorbide mononitrate  60 mg Oral Q breakfast  . memantine  5 mg Oral BID  . metronidazole  500 mg Intravenous Q8H  . mirtazapine  7.5 mg Oral QHS  . morphine  45 mg Oral TID  . pantoprazole  40 mg Oral Daily  . silver sulfADIAZINE  1 application Topical Q48H   Continuous Infusions: . dextrose 5 % and 0.9 % NaCl with KCl 20 mEq/L 10 mL/hr (08/27/12 0241)   PRN Meds:.HYDROcodone-acetaminophen,  ondansetron (ZOFRAN) IV, ondansetron, polyethylene glycol    LOS: 8 days   Sari Cogan,CHRISTOPHER  [facility] Pager [phone]. If 8PM-8AM, please contact night-coverage at [email or web address], password [facility] 08/27/2012, 3:12 PM  LOS: 8 days

## 2012-08-27 NOTE — Progress Notes (Signed)
Physical Therapy Treatment Patient Details Name: Tamara Turner MRN: [id number] DOB: [birth year] Today's Date: 08/27/2012 Time: 9528-4132 PT Time Calculation (min): 25 min  PT Assessment / Plan / Recommendation Comments on Treatment Session  Patient more agreeable today, but still limited by incontinence, ? diarrhea.  Feel medical issues along with weakness would limit patient's ability to care for herself sufficiently at ALF.  Continue to recommend SNF.    Follow Up Recommendations  SNF     Does the patient have the potential to tolerate intense rehabilitation   N/A     Equipment Recommendations  None recommended by PT    Recommendations for Other Services  None  Frequency Min 3X/week   Plan Discharge plan remains appropriate    Precautions / Restrictions Precautions Precautions: Fall   Pertinent Vitals/Pain Min c/o generalized pain; SpO2 95% after ambulation on 2 L O2    Mobility  Bed Mobility Supine to Sit: 4: Min assist;HOB flat Sitting - Scoot to Edge of Bed: 6: Modified independent (Device/Increase time) Sit to Supine: Not Tested (comment) (wanted to sit edge of bed to call niece) Transfers Sit to Stand: 4: Min guard;From toilet;From bed;With upper extremity assist Stand to Sit: 4: Min guard;To toilet;To bed;With upper extremity assist Details for Transfer Assistance: assist for safety, cues for use of grabbar in bathroom Ambulation/Gait Ambulation/Gait Assistance: 4: Min guard;5: Supervision Ambulation Distance (Feet): 20 Feet (x 2) Assistive device: Rolling walker Ambulation/Gait Assistance Details: limited distance due to still little incontinent of stool; flexed posture with c/o back pain, very slow, but safe around corner of bathroom and around sink to wash hands at sink Gait Pattern: Step-through pattern;Decreased stride length;Trunk flexed;Narrow base of support      PT Goals Acute Rehab PT Goals Pt will go Supine/Side to Sit: with modified  independence PT Goal: Supine/Side to Sit - Progress: Progressing toward goal Pt will go Sit to Stand: with supervision PT Goal: Sit to Stand - Progress: Progressing toward goal Pt will Ambulate: 51 - 150 feet;with least restrictive assistive device;Other (comment) PT Goal: Ambulate - Progress: Not progressing (due to fecal incontinence)  Visit Information  Last PT Received On: 08/27/12    Subjective Data  Subjective: A lady came in this morning to say I'd be discharged and haven't heard a thing since.   Cognition  Cognition Overall Cognitive Status: Appears within functional limits for tasks assessed/performed Arousal/Alertness: Awake/alert Orientation Level: Appears intact for tasks assessed Behavior During Session: [facility] for tasks performed Cognition - Other Comments: stated been in hospital a week and a day    Balance  Static Sitting Balance Static Sitting - Balance Support: Feet supported Static Sitting - Level of Assistance: 6: Modified independent (Device/Increase time) Static Sitting - Comment/# of Minutes: sitting edge of bed with b/s table close at end of session to make phone call Static Standing Balance Static Standing - Balance Support: No upper extremity supported Static Standing - Level of Assistance: 5: Stand by assistance Static Standing - Comment/# of Minutes: washing hands in sink about 2 mintues flexed posture and weak trunk; supervision for safety  End of Session PT - End of Session Equipment Utilized During Treatment: Gait belt Activity Tolerance: Other (comment) (limited by incontinence) Patient left: in bed;with call bell/phone within reach   GP     [facility] 08/27/2012, 2:16 PM [place], [place] [phone] 08/27/2012

## 2012-08-28 DIAGNOSIS — I259 Chronic ischemic heart disease, unspecified: Secondary | ICD-10-CM

## 2012-08-28 MED ORDER — ALPRAZOLAM 0.25 MG PO TABS: 0.2500 mg | ORAL_TABLET | Freq: Two times a day (BID) | ORAL | Status: DC

## 2012-08-28 MED ORDER — COLLAGENASE 250 UNIT/GM EX OINT: TOPICAL_OINTMENT | Freq: Every day | CUTANEOUS | Status: DC

## 2012-08-28 MED ORDER — MORPHINE SULFATE ER 15 MG PO TBCR: 45.0000 mg | EXTENDED_RELEASE_TABLET | Freq: Three times a day (TID) | ORAL | Status: DC

## 2012-08-28 MED ORDER — HYDROCODONE-ACETAMINOPHEN 5-325 MG PO TABS: 1.0000 | ORAL_TABLET | ORAL | Status: AC | PRN

## 2012-08-28 NOTE — Discharge Summary (Signed)
Physician Discharge Summary  Tamara Turner WUJ:[id number] DOB: Jan 16, 1922 DOA: 08/19/2012  PCP: Florentina Jenny, MD  Admit date: 08/19/2012 Discharge date: 08/28/2012  Time spent: 40 minutes  Recommendations for Outpatient Follow-up:  1. Follow up with PMD. 2. Follow up with Dr Axel Filler, surgeon, in 1 week. 3. Follow up with Dr Kimberlee Nearing, hematologist-Onclologist.   Discharge Diagnoses:  Active Problems:   Weight loss   Thrombocytosis   Diarrhea   Dilated intrahepatic bile duct   Dehydration, mild   Nonspecific (abnormal) findings on radiological and other examination of biliary tract   Cholecystitis, acute   Discharge Condition: Satisfactory.   Diet recommendation: Heart-Healthy  Filed Weights   08/26/12 0550 08/27/12 0421 08/28/12 0650  Weight: 48.988 kg (108 lb) 48.807 kg (107 lb 9.6 oz) 49.714 kg (109 lb 9.6 oz)    History of present illness:  79 y.o. female with hx of PVD, DVT, HTN, depression, and right lateral ankle infection recently on antibiotic, presenting to the ED with abdominal pain and diarrhea. She said her diarrhea had been going on for three days. She felt weak. Evaluation in the ER showed platelet count of 1.8 millions, with normal other indices. She also had a CT scan of the abdomen, which showed intrahepatic dilatation to 1.8 cm, and pericolic fluid, suggestive of cholecystitis or obtructive process as in cholangiocarcinoma. She also has segment of colonic wall thickening which could represents inflammatory process vs infectious process. Hospitalist was asked to admit this patients for the above reasons.   Hospital Course:  1. Diarrhea, on admission: Patient had a brief episode of diarrhea, which proved to be self-limited. This was possibly an acute viral gastroenteritis. Resolved.  2. New onset abdominal pain: Patient complained of upper abdominal pain, mostly following meals. In the context of abnormal imaging (CT abdomen and abdominal  ultrasound), [facility] was consulted and Dr Melvia Heaps provided a consultation. HIDA scan was suggestive of acute cholecystitis, necessitating a surgical consultation, which was provided by Dr Chipper Herb. Per surgeon, patient was a poor surgical candidate and initial recommendation, was for percutaneous cholecystostomy drain by IR/antibiotics. Subsequently the surgical team have opted for no drainage and only antibiotics. MRCP which did not show lesion suspicious for cholangiocarcinoma or objective evidence of cholecystitis. Patient feels much better, as of 08/27/12, she remained afebrile and wcc has normalized. Per my discussion with Dr Derrell Lolling on 08/28/12, antibiotics have been discontinued. Patient will follow up with him, on discharge.  3. Thrombocytosis, chronic/Anemia: Patient had a markedly elevated platelet count of 1892 at presentation. EDP discussed with Dr. Arline Asp on admission, who recommended checking JAK-2. This was not detected and outpatient followup with Dr Arline Asp in his office in a week's time with repeat CBC, has been arranged. He also recommended Aspirin but no Hydroxyurea at this time. Hb is stable.  4. HTN: BP was reasonably controlled, albeit sub-optimally, during this hospitalization. Amlodipine 5 mg daily was added to medications, on discharge.  5. History of A. Fib/Bradycardia: During this hospitalization, patient had intermittent bradycardia. Metoprolol was discontinued, with resolution. Patient continues on Digoxin. Otherwise, stable.  6. History of depression/Anxiety: Stable on pre-admission psychotropics.  7. Deconditioning: patient appears clinically deconditioned. Evaluated by PT/OT and SNF recommended.    Procedures:  See Below.   Consultations: Dr Axel Filler, Surgeon.  Dr Melvia Heaps, GI.   Discharge Exam: Filed Vitals:   08/27/12 1500 08/27/12 2130 08/28/12 0639 08/28/12 0650  BP: 130/55 145/64  175/69  Pulse: 70 71 72 68  Temp: 97.6 F (36.4  C) 98.2 F (36.8 C)  97.6 F (36.4 C)  TempSrc: Oral Oral  Oral  Resp: 20 18  18   Height:      Weight:    49.714 kg (109 lb 9.6 oz)  SpO2: 96% 93%  94%   General: Comfortable, alert, communicative, fully oriented, not short of breath at rest.  HEENT: Mild clinical pallor, no jaundice, no conjunctival injection or discharge.  NECK: Supple, JVP not seen, no carotid bruits, no palpable lymphadenopathy, no palpable goiter.  CHEST: Clinically clear to auscultation, no wheezes, no crackles.  HEART: Sounds 1 and 2 heard, normal, regular, no murmurs.  ABDOMEN: Full, soft, non-tender, no palpable organomegaly, no palpable masses, normal bowel sounds.  GENITALIA: Not examined.  LOWER EXTREMITIES: No pitting edema, palpable peripheral pulses.  MUSCULOSKELETAL SYSTEM: Generalized osteoarthritic changes, otherwise, normal.  CENTRAL NERVOUS SYSTEM: No focal neurologic deficit on gross examination.  Discharge Instructions      Discharge Orders   Future Orders Complete By Expires     Diet - low sodium heart healthy  As directed     Increase activity slowly  As directed         Medication List    STOP taking these medications       cholestyramine light 4 G packet  Commonly known as:  PREVALITE     loperamide 2 MG capsule  Commonly known as:  IMODIUM     metoprolol tartrate 25 MG tablet  Commonly known as:  LOPRESSOR     spironolactone 25 MG tablet  Commonly known as:  ALDACTONE      TAKE these medications       ALPRAZolam 0.25 MG tablet  Commonly known as:  XANAX  Take 1 tablet (0.25 mg total) by mouth 2 (two) times daily.     amLODipine 10 MG tablet  Commonly known as:  NORVASC  Take 10 mg by mouth daily with breakfast.     aspirin 81 MG chewable tablet  Chew 81 mg by mouth daily with breakfast.     calcium-vitamin D 500-200 MG-UNIT per tablet  Commonly known as:  OSCAL WITH D  Take 1 tablet by mouth daily with breakfast.     collagenase ointment  Commonly known as:   SANTYL  Apply topically daily. Apply to right ankle wound daily     digoxin 0.125 MG tablet  Commonly known as:  LANOXIN  Take 0.125 mg by mouth daily with breakfast.     DULoxetine 60 MG capsule  Commonly known as:  CYMBALTA  Take 1 capsule (60 mg total) by mouth daily.     feeding supplement Liqd  Take 237 mLs by mouth 2 (two) times daily before lunch and supper.     gabapentin 300 MG capsule  Commonly known as:  NEURONTIN  Take 300 mg by mouth 2 (two) times daily.     HYDROcodone-acetaminophen 5-325 MG per tablet  Commonly known as:  NORCO/VICODIN  Take 1 tablet by mouth every 4 (four) hours as needed for pain. Pain     isosorbide mononitrate 60 MG 24 hr tablet  Commonly known as:  IMDUR  Take 60 mg by mouth daily with breakfast.     lactobacillus Pack  Take 1 g by mouth 2 (two) times daily.     lansoprazole 30 MG capsule  Commonly known as:  PREVACID  Take 30 mg by mouth daily with breakfast.     memantine 5 MG tablet  Commonly  known as:  NAMENDA  Take 5 mg by mouth 2 (two) times daily.     MI-ACID 200-200-20 MG/5ML suspension  Generic drug:  alum & mag hydroxide-simeth  Take 15 mLs by mouth 3 (three) times daily.     mirtazapine 15 MG tablet  Commonly known as:  REMERON  Take 7.5 mg by mouth at bedtime.     morphine 15 MG 12 hr tablet  Commonly known as:  MS CONTIN  Take 3 tablets (45 mg total) by mouth 3 (three) times daily.     polyethylene glycol packet  Commonly known as:  MIRALAX / GLYCOLAX  Take 17 g by mouth daily.     PRESERVISION AREDS PO  Take 1 capsule by mouth 2 (two) times daily.     silver sulfADIAZINE 1 % cream  Commonly known as:  SILVADENE  Apply 1 application topically every other day. With wound cleaning procedure       Follow-up Information   Schedule an appointment as soon as possible for a visit with Florentina Jenny, MD.   Contact information:   [phone] [place] [address] [phone]       Follow up  with Lajean Saver, MD. Schedule an appointment as soon as possible for a visit in 1 week.   Contact information:   [address] [place] [address] [phone]       Schedule an appointment as soon as possible for a visit with Samul Dada, MD.   Contact information:   [address] [place] [address] [phone]        The results of significant diagnostics from this hospitalization (including imaging, microbiology, ancillary and laboratory) are listed below for reference.    Significant Diagnostic Studies: Nm Hepatobiliary  08/21/2012  *RADIOLOGY REPORT*  Clinical Data:  Cholecystitis  NUCLEAR MEDICINE HEPATOBILIARY IMAGING  Technique:  Sequential images of the abdomen were obtained out to 60 minutes following intravenous administration of radiopharmaceutical.  Radiopharmaceutical:  5.42mCi Tc-38m Choletec  Comparison:  CT ultrasound 08/19/2012  Findings: There is uniform uptake of radiotracer within the liver. Counts are evident within the small bowel by 15 minutes.  The gallbladder was not demonstrated by 60 minutes.  1.85 mg of IV morphine was administered.  The patient was imaged for additional 25 minutes without visualization of the gallbladder.  The patient was in extreme pain and patient refused further imaging. Typically the patient's image 30-45 minutes post morphine.  IMPRESSION: 1.  Nonvisualization of the gallbladder following morphine injection is most consistent with acute cholecystitis. 2.  Patent common bile duct.  These results will be called to the ordering clinician or representative by the Radiologist Assistant, and communication documented in the PACS Dashboard.   Original Report Authenticated By: Genevive Bi, M.D.    US Abdomen Complete  08/20/2012  *RADIOLOGY REPORT*  Clinical Data:  Right upper quadrant abdominal pain.  ABDOMINAL ULTRASOUND COMPLETE  Comparison:  CT of the abdomen and pelvis performed earlier today at 09:16 p.m.   Findings:  Gallbladder: The gallbladder is diffusely thick-walled, with the wall measuring up to 0.7 cm in thickness.  No pericholecystic fluid is seen.  This is nonspecific, and could reflect a variety of conditions, such as hypoalbuminemia or congestive right heart failure.  No stones are identified; no ultrasonographic Murphy's sign is elicited.  Common Bile Duct:  1.3 cm in diameter; diffusely dilated, of uncertain significance.  Liver: Diffusely increased hepatic echogenicity and coarsened echotexture, compatible with  fatty infiltration.  Intrahepatic biliary ductal dilatation at the left hepatic lobe is not well characterized on ultrasound.  A 1.2 cm cyst is noted within the inferior aspect of the right hepatic lobe.  Limited Doppler evaluation demonstrates normal blood flow within the liver.  IVC:  Unremarkable in appearance.  Pancreas:  Although the pancreas is difficult to visualize in its entirety due to overlying bowel gas, no focal pancreatic abnormality is identified.  Spleen:  14.4 cm in length; increased in size, with increased echogenicity.  Right kidney:  9.8 cm in length; grossly normal in size and configuration.  Diffusely increased parenchymal echogenicity is noted.  No evidence of hydronephrosis.  Multiple large right-sided renal cysts are seen, measuring up to 9.0 cm in size.  Left kidney:  11.1 cm in length; grossly normal in size and configuration.  Diffusely increased parenchymal echogenicity is noted.  No evidence of hydronephrosis. Multiple large left-sided renal cysts are seen, measuring up to 5.9 cm in size.  Abdominal Aorta:  Normal in caliber; no aneurysm identified. Scattered calcific atherosclerotic disease is noted along the visualized abdominal aorta.  IMPRESSION:  1.  Diffusely thick-walled gallbladder, with the wall measuring up to 0.7 cm in thickness.  No pericholecystic fluid seen; no stones identified.  No ultrasonographic Murphy's sign elicited.  Findings are nonspecific, and  could reflect a variety of conditions, such as hypoalbuminemia or congestive right heart failure. 2.  Diffuse dilatation of the common hepatic duct, of uncertain significance.  Intrahepatic biliary ductal dilatation at the left hepatic lobe is not well characterized on ultrasound.  Findings on CT raise concern for distal obstruction, not well assessed on this study.  As suggested on CT, MRCP could be considered for further evaluation, when and as deemed clinically appropriate (when and if the patient can tolerate holding her breath for the study). 3.  Diffuse fatty infiltration within the liver.  Small hepatic cyst seen. 4.  Splenomegaly again noted. 5.  Large bilateral renal cysts seen. 6.  Diffusely increased renal parenchymal echogenicity noted bilaterally, raising question for medical renal disease. 7.  Scattered calcific atherosclerotic disease noted along the visualized abdominal aorta.   Original Report Authenticated By: Tonia Ghent, M.D.    Ct Abdomen Pelvis W Contrast  08/19/2012  *RADIOLOGY REPORT*  Clinical Data: Diarrhea.  CT ABDOMEN AND PELVIS WITH CONTRAST  Technique:  Multidetector CT imaging of the abdomen and pelvis was performed following the standard protocol during bolus administration of intravenous contrast.  Contrast: 80mL OMNIPAQUE IOHEXOL 300 MG/ML  SOLN  Comparison: CT of the abdomen and pelvis performed 03/28/2012  Findings: Increased interstitial prominence at the lung bases could reflect mild interstitial edema.  Note is again made of marked prominence of the biliary ducts within the left hepatic lobe, with marked distension of the common hepatic duct, measuring 1.8 cm in diameter.  This again raises concern for an underlying obstructive process, such as cholangiocarcinoma; as before, MRCP could be considered for further evaluation, when and as deemed clinically appropriate.  Underlying periportal edema is noted.  There is diffuse wall thickening and a small amount of pericholecystic  fluid with respect to the gallbladder; the gallbladder lumen is relatively contracted. This is nonspecific, but raises question for cholecystitis.  Would correlate for associated symptoms.  The liver is otherwise grossly unremarkable in appearance.  The spleen is enlarged, measuring 14.2 cm, and appears somewhat bulky.  The pancreas and adrenal glands are unremarkable.  Numerous large bilateral renal cysts are again seen, measuring up  to 8.2 cm in size.  There is associated distortion of the renal parenchyma bilaterally.  Mild nonspecific perinephric stranding is seen.  There is no evidence of hydronephrosis.  No renal or ureteral stones are identified.  No free fluid is identified.  The small bowel is unremarkable in appearance.  The stomach is within normal limits.  No acute vascular abnormalities are seen.  Extensive diffuse calcification is noted along the abdominal aorta and its branches, including along the proximal renal arteries bilaterally.  There is likely moderate to severe stenosis along the common and external iliac arteries bilaterally, due to extensive calcific atherosclerotic disease.  The patient is status post appendectomy.  The colon is difficult to fully characterize due to surrounding bowel loops, but there may be mild colonic wall thickening along the sigmoid colon, which could reflect a mild infectious or inflammatory process.  The bladder is mildly distended and grossly unremarkable in appearance.  The uterus is not well characterized.  No suspicious adnexal masses are seen.  No inguinal lymphadenopathy is seen.  No acute osseous abnormalities are identified.  IMPRESSION:  1.  Question of mild colonic wall thickening along the sigmoid colon, which could reflect a mild infectious or inflammatory process.  Alternatively, this could simply reflect decompression; the colon is difficult to fully assess. 2.  Diffuse wall thickening and a small amount of pericholecystic fluid with respect to the  gallbladder, new from the prior study. The gallbladder lumen is relatively contracted.  This is nonspecific, but raises question for cholecystitis.  Would correlate for associated symptoms. 3.  Note again made of marked prominence of the biliary ducts within the left hepatic lobe, with marked distension of the common hepatic duct to 1.8 cm in diameter.  As before, this raises concern for underlying obstructive process, suggest cholangiocarcinoma; MRCP could be considered for further evaluation, when and as deemed clinically appropriate (when and if the patient can tolerate holding her breath for the MRI study). 4.  Increased interstitial prominence of the lung bases could reflect mild interstitial edema. 5.  Splenomegaly noted. 6.  Underlying periportal edema seen. 7.  Numerous large bilateral renal cysts again seen, with distortion of the underlying renal parenchyma. 8.  Extensive diffuse calcification along the abdominal aorta and its branches, including along the proximal renal arteries bilaterally.  Likely moderate to severe stenosis along the common and external iliac arteries bilaterally, due to calcific atherosclerotic disease.   Original Report Authenticated By: Tonia Ghent, M.D.    Mr 3d Recon At Scanner  08/26/2012  *RADIOLOGY REPORT*  Clinical Data:  Abdominal pain with biliary dilatation.  Evaluate for biliary obstruction, neoplasm or cholecystitis.  MRI ABDOMEN WITHOUT AND WITH CONTRAST (INCLUDING MRCP)  Technique:  Multiplanar multisequence MR imaging of the abdomen was performed both before and after the administration of intravenous contrast. Heavily T2-weighted images of the biliary and pancreatic ducts were obtained, and three-dimensional MRCP images were rendered by post processing.  Contrast: 10mL MULTIHANCE GADOBENATE DIMEGLUMINE 529 MG/ML IV SOLN  Comparison:  Abdominal ultrasound and CT 08/19/2012.  MRCP 04/24/2012.  Hepatobiliary scan 08/21/2012.  Findings:  The study is moderately  degraded by motion on this inpatient who had difficulty suspending respiration especially towards the end of the study which includes the post contrast images.  Again demonstrated is marked atrophy of the left hepatic lobe with moderate irregular intrahepatic biliary dilatation.  There is no significant intrahepatic biliary dilatation within the right hepatic lobe.  The extrahepatic biliary system is moderately dilated, measuring  up to 1.5 cm in diameter, similar to prior MRCP. There is no evidence of choledocholithiasis.  Distension of the gallbladder appears stable.  No gallbladder wall thickening or cholelithiasis is identified.  There is a stable small cyst inferiorly in the right hepatic lobe. No suspicious hepatic enhancement is identified.  Mild splenomegaly appears stable.  There is stable distortion of the pancreas and pancreatic atrophy.  No pancreatic mass or pancreatic ductal dilatation is identified.  Again demonstrated are multiple large renal cysts bilaterally. Some of the right-sided cysts project anteriorly near the ampulla but appear unchanged.  There are complex bilateral pleural effusions with associated bibasilar atelectasis. There is mild diffuse soft tissue edema.  IMPRESSION:  1.  Extrahepatic and left intrahepatic biliary dilatation is similar to the prior MRCP and likely longstanding, but of undetermined etiology.  No evidence of choledocholithiasis or pancreatic mass. 2. Stable atrophy of the left hepatic lobe without suspicious enhancement to suggest a central cholangiocarcinoma. 3.  No objective evidence of cholecystitis demonstrated.  However, patency of the cystic duct was not demonstrated on recent hepatobiliary scan. 4.  Multiple large bilateral renal cysts again noted. 5.  Anasarca with enlarging bilateral pleural effusions.   Original Report Authenticated By: Carey Bullocks, M.D.    Mr Abd W/wo Cm/mrcp  08/26/2012  *RADIOLOGY REPORT*  Clinical Data:  Abdominal pain with biliary  dilatation.  Evaluate for biliary obstruction, neoplasm or cholecystitis.  MRI ABDOMEN WITHOUT AND WITH CONTRAST (INCLUDING MRCP)  Technique:  Multiplanar multisequence MR imaging of the abdomen was performed both before and after the administration of intravenous contrast. Heavily T2-weighted images of the biliary and pancreatic ducts were obtained, and three-dimensional MRCP images were rendered by post processing.  Contrast: 10mL MULTIHANCE GADOBENATE DIMEGLUMINE 529 MG/ML IV SOLN  Comparison:  Abdominal ultrasound and CT 08/19/2012.  MRCP 04/24/2012.  Hepatobiliary scan 08/21/2012.  Findings:  The study is moderately degraded by motion on this inpatient who had difficulty suspending respiration especially towards the end of the study which includes the post contrast images.  Again demonstrated is marked atrophy of the left hepatic lobe with moderate irregular intrahepatic biliary dilatation.  There is no significant intrahepatic biliary dilatation within the right hepatic lobe.  The extrahepatic biliary system is moderately dilated, measuring up to 1.5 cm in diameter, similar to prior MRCP. There is no evidence of choledocholithiasis.  Distension of the gallbladder appears stable.  No gallbladder wall thickening or cholelithiasis is identified.  There is a stable small cyst inferiorly in the right hepatic lobe. No suspicious hepatic enhancement is identified.  Mild splenomegaly appears stable.  There is stable distortion of the pancreas and pancreatic atrophy.  No pancreatic mass or pancreatic ductal dilatation is identified.  Again demonstrated are multiple large renal cysts bilaterally. Some of the right-sided cysts project anteriorly near the ampulla but appear unchanged.  There are complex bilateral pleural effusions with associated bibasilar atelectasis. There is mild diffuse soft tissue edema.  IMPRESSION:  1.  Extrahepatic and left intrahepatic biliary dilatation is similar to the prior MRCP and likely  longstanding, but of undetermined etiology.  No evidence of choledocholithiasis or pancreatic mass. 2. Stable atrophy of the left hepatic lobe without suspicious enhancement to suggest a central cholangiocarcinoma. 3.  No objective evidence of cholecystitis demonstrated.  However, patency of the cystic duct was not demonstrated on recent hepatobiliary scan. 4.  Multiple large bilateral renal cysts again noted. 5.  Anasarca with enlarging bilateral pleural effusions.   Original Report Authenticated By: Chrissie Noa  Purcell Mouton, M.D.     Microbiology: Recent Results (from the past 240 hour(s))  MRSA PCR SCREENING     Status: None   Collection Time    08/20/12  6:09 AM      Result Value Range Status   MRSA by PCR NEGATIVE  NEGATIVE Final   Comment:            The GeneXpert MRSA Assay (FDA     approved for NASAL specimens     only), is one component of a     comprehensive MRSA colonization     surveillance program. It is not     intended to diagnose MRSA     infection nor to guide or     monitor treatment for     MRSA infections.     Labs: Basic Metabolic Panel:  Recent Labs Lab 08/23/12 0610 08/24/12 0600 08/27/12 0614  NA 139 136  --   K 4.1 4.1  --   CL 106 102  --   CO2 25 26  --   GLUCOSE 90 92  --   BUN 17 13  --   CREATININE 0.67 0.63 0.75  CALCIUM 9.3 9.2  --    Liver Function Tests:  Recent Labs Lab 08/23/12 0610  AST 19  ALT 5  ALKPHOS 90  BILITOT 0.3  PROT 5.9*  ALBUMIN 2.8*   No results found for this basename: LIPASE, AMYLASE,  in the last 168 hours No results found for this basename: AMMONIA,  in the last 168 hours CBC:  Recent Labs Lab 08/23/12 0610 08/24/12 0600  WBC 8.4 8.5  HGB 10.1* 10.5*  HCT 31.3* 32.4*  MCV 97.5 96.4  PLT 573* 719*   Cardiac Enzymes: No results found for this basename: CKTOTAL, CKMB, CKMBINDEX, TROPONINI,  in the last 168 hours BNP: BNP (last 3 results) No results found for this basename: PROBNP,  in the last 8760  hours CBG: No results found for this basename: GLUCAP,  in the last 168 hours     Signed:  OTI,CHRISTOPHER  [facility] 08/28/2012, 2:04 PM

## 2012-08-28 NOTE — Progress Notes (Signed)
Report called to Nursing facility that client is being discharged to.  She is somewhat disgruntled, since, she did not know this until now.  Medicated with scheduled medication prior to release.  IV site discontinued.

## 2012-08-28 NOTE — Progress Notes (Signed)
OT Cancellation Note  Patient Details Name: Tamara Turner MRN: [id number] DOB: May 04, 1922   Cancelled Treatment:    Reason Eval/Treat Not Completed:  (Pt's D/C plan is SNF per conference this AM, will defer eval)  Evette Georges [phone] 08/28/2012, 10:00 AM

## 2012-08-28 NOTE — Progress Notes (Signed)
Report called to the facility.  RX for medications in the package and pointed out to the transport team.  IV removed.  Dressed and cleaned for transport.  Released.

## 2012-09-14 NOTE — Clinical Social Work Placement (Addendum)
     Clinical Social Work Department CLINICAL SOCIAL WORK PLACEMENT NOTE 09/14/2012  Patient:  Tamara [facility], Tamara Turner  Account Number:  [id number] Admit date:  08/19/2012  Clinical Social Worker:  Lupita Leash Sanae Willetts, LCSWA  Date/time:  08/27/2012 05:00 PM  Clinical Social Work is seeking post-discharge placement for this patient at the following level of care:   SKILLED NURSING   (*CSW will update this form in Epic as items are completed)   08/27/2012  Patient/family provided with [facility] Department of Clinical Social Works list of facilities offering this level of care within the geographic area requested by the patient (or if unable, by the patients family).  08/27/2012  Patient/family informed of their freedom to choose among providers that offer the needed level of care, that participate in Medicare, Medicaid or managed care program needed by the patient, have an available bed and are willing to accept the patient.  08/27/2012  Patient/family informed of [facility] ownership interest in [facility], as well as of the fact that they are under no obligation to receive care at this facility.  PASARR submitted to EDS on  PASARR number received from EDS on   FL2 transmitted to all facilities in geographic area requested by pt/family on  08/27/2012 FL2 transmitted to all facilities within larger geographic area on   Patient informed that his/her managed care company has contracts with or will negotiate with  certain facilities, including the following:   Has existing PASARR     Patient/family informed of bed offers received:  08/28/2012 Patient chooses bed at [facility] recommends and patient chooses bed at    Patient to be transferred to [facility] on  08/28/2012 Patient to be transferred to facility by Ambulance  The following physician request were entered in Epic:   Additional Comments: 08/28/12  Patient and  daughter are pleased with d/c plan; notified SNF and pt's nurse of d/c.  [facility] admissions was notified of change in level of care.  No further CSW needs identified.  CSW signing off.  Lorri Frederick. Lynnlee Revels, LCSA  [phone]

## 2012-09-14 NOTE — Clinical Social Work Psychosocial (Signed)
     Clinical Social Work Department BRIEF PSYCHOSOCIAL ASSESSMENT 09/14/2012  Patient:  Tamara Turner, Tamara Turner     Account Number:  [id number]     Admit date:  08/19/2012  Clinical Social Worker:  Tiburcio Pea  Date/Time:  08/26/2012 01:00 PM  Referred by:  RN  Date Referred:  08/26/2012 Referred for  Tamara [facility]   Other Referral:   Interview type:  Other - See comment Other interview type:   Patient    PSYCHOSOCIAL DATA Living Status:  FAMILY Admitted from facility:  [facility] Level of care:  Assisted Living Primary support name:  Tamara Turner  [phone] Primary support relationship to patient:  FAMILY Degree of support available:   (Neice)- Very supportive    CURRENT CONCERNS Current Concerns  Post-Acute [facility]   Other Concerns:   Patient wants to return to [facility]    SOCIAL WORK ASSESSMENT / PLAN CSW met wiht patient today.CSW was given report by nursing throughout her hospital stay that she lived at thome with her neice.  Actually, she is a resident of [facility] and states that she hopes to return there. She has been a resident in the recent past with [facility] (d/c'd at the end of Feb.) to the [facility].  Patient states that she was not unhappy there but really wants to go back to her [facility]. She will discuss with her neice [name] Patient is alert and oriented..   Assessment/plan status:  Psychosocial Support/Ongoing Assessment of Needs Other assessment/ plan:   Information/referral to community resources:   None at this time- awaiting PT's evaluation for possible level of care needs    PATIENTS/FAMILYS RESPONSE TO PLAN OF CARE: Patient is alert and oriented; very pleasant lady who currently  resides in an [facility]. She is weak and nursing is concerned taht she might require a higher level of care. CSW will monitor.

## 2012-11-25 IMAGING — CT CT ABD-PELV W/ CM
2 of 5 series · 16 of 46 positions shown, 18 images · IV contrast (omnipaque)
Comparison: None.

CLINICAL DATA: Epigastric pain with weight loss.  History of
appendicitis and recent endoscopy.

CT ABDOMEN AND PELVIS WITH CONTRAST
TECHNIQUE: Multidetector CT imaging of the abdomen and pelvis was
performed following the standard protocol during bolus
administration of intravenous contrast.
Contrast: 80mL OMNIPAQUE IOHEXOL 300 MG/ML  SOLN

[Series 3: abd/ pel 5mm · axial · 0.61mm/px · z∈[-346,+34]mm · 13 of 86 slices shown, 15 images]
[im 5/86  soft-tissue]
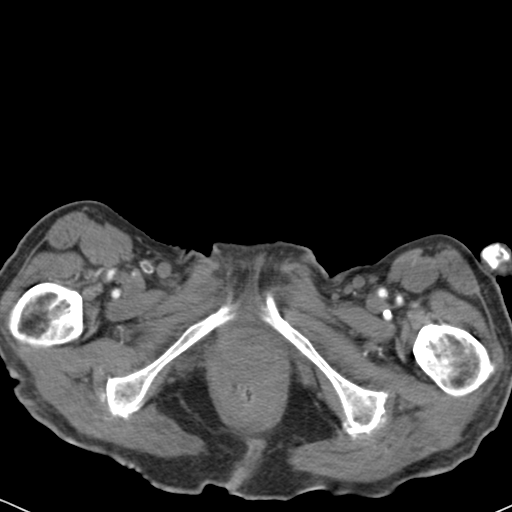
[im 5/86  bone]
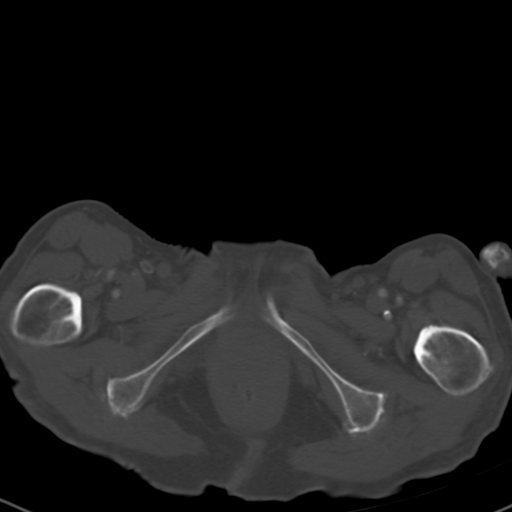
[im 13/86  soft-tissue]
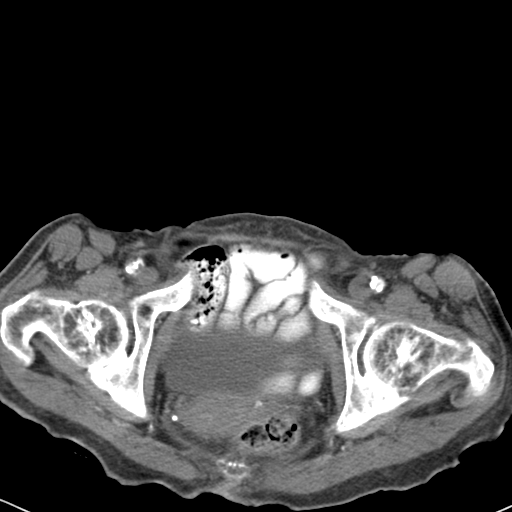
[im 17/86  soft-tissue]
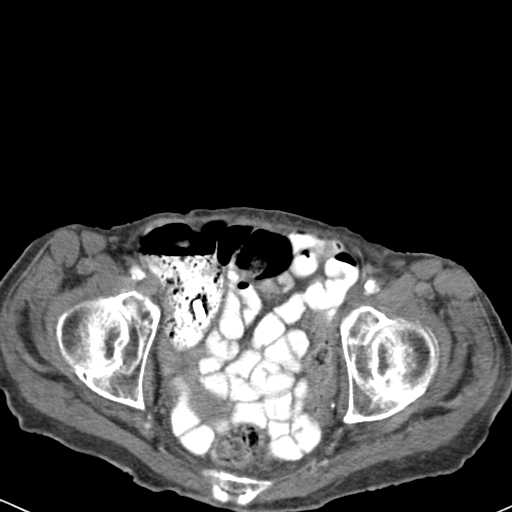
[im 25/86  soft-tissue]
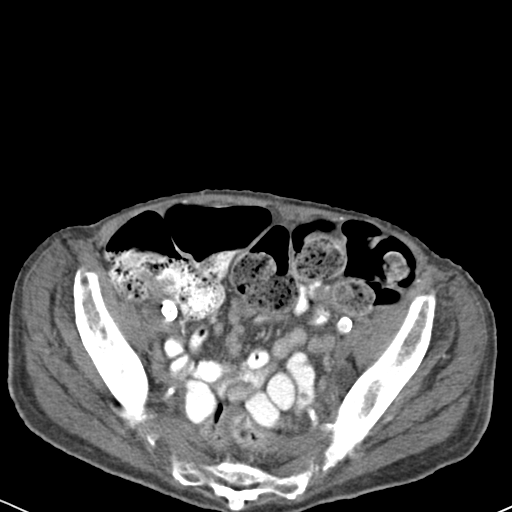
[im 29/86  soft-tissue]
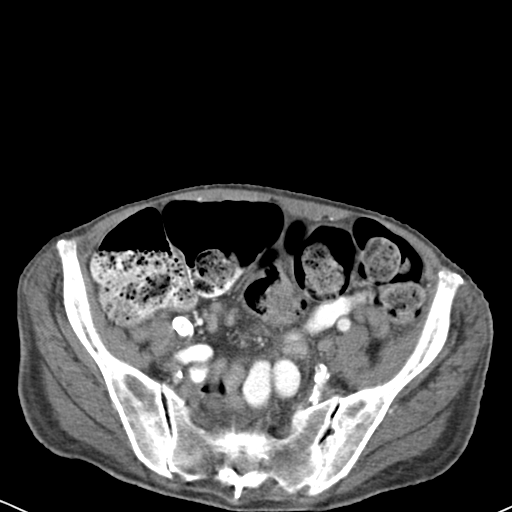
[im 37/86  soft-tissue]
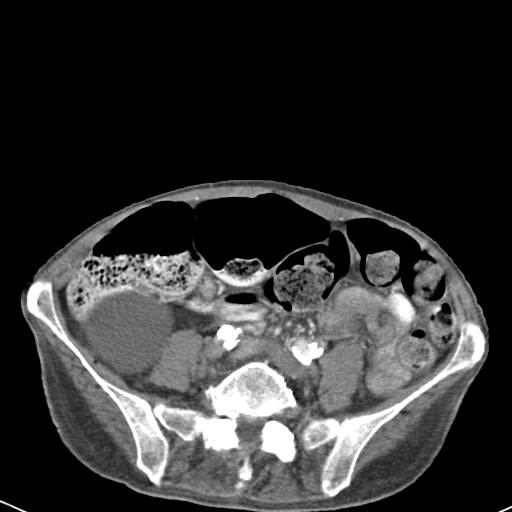
[im 45/86  soft-tissue]
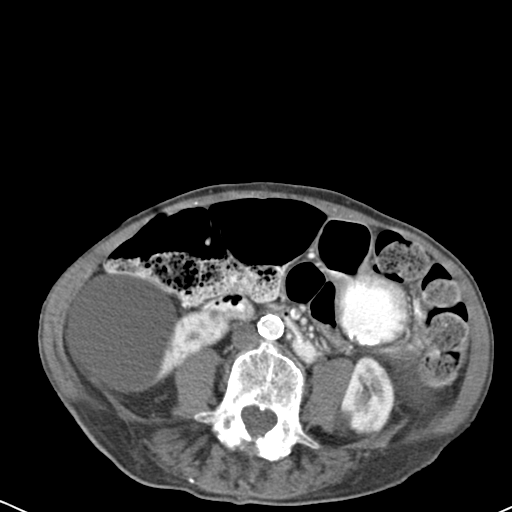
[im 49/86  soft-tissue]
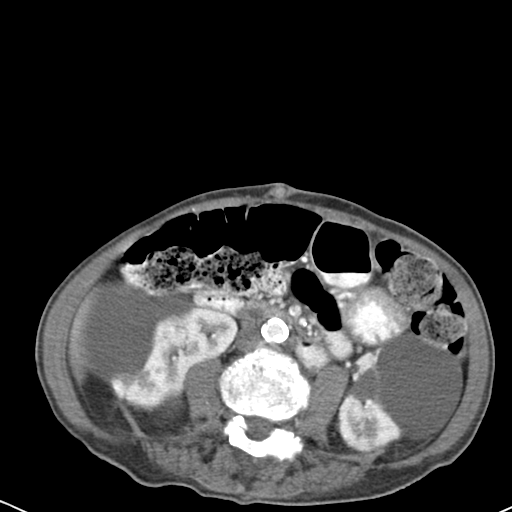
[im 57/86  soft-tissue]
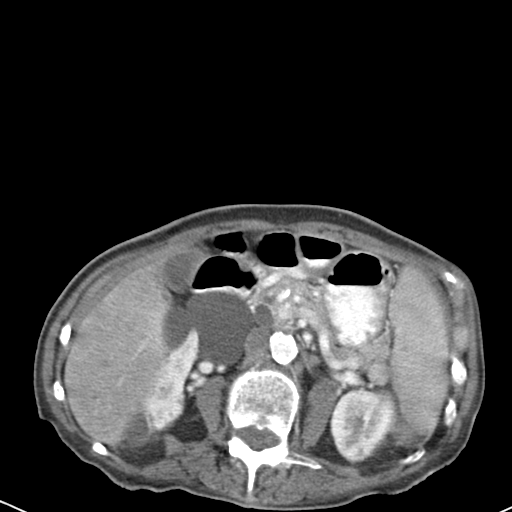
[im 57/86  bone]
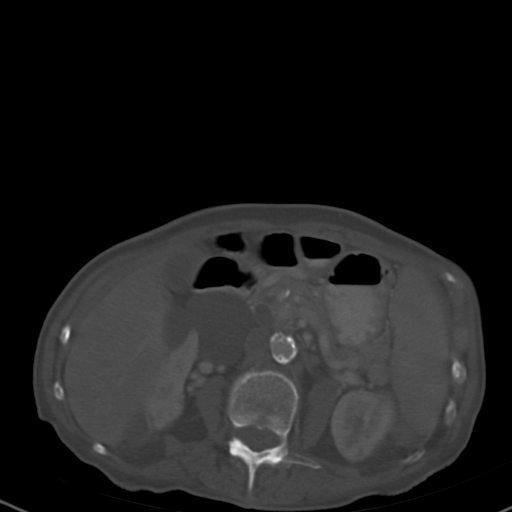
[im 61/86  soft-tissue]
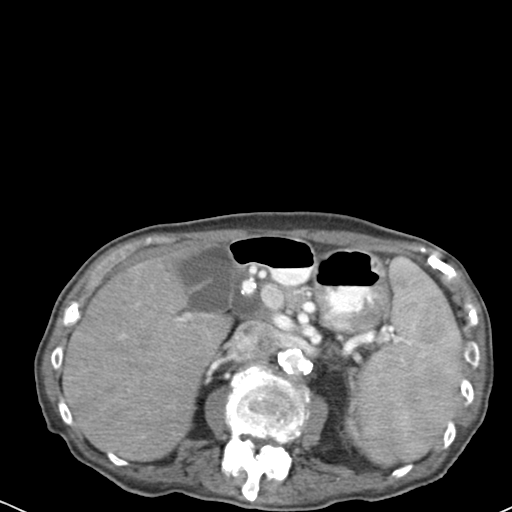
[im 69/86  soft-tissue]
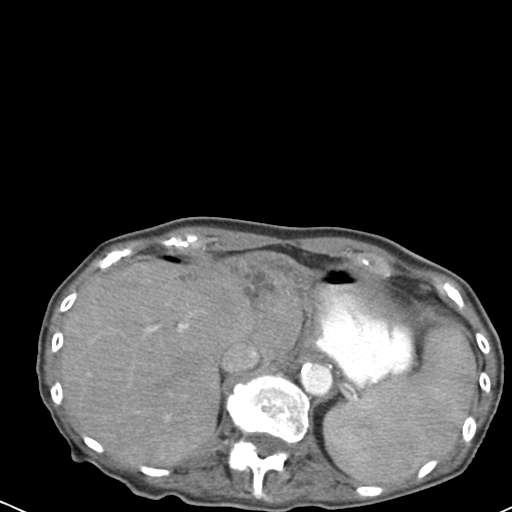
[im 73/86  soft-tissue]
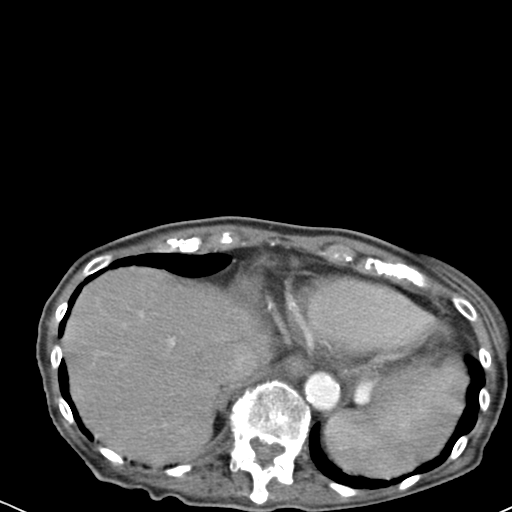
[im 81/86  soft-tissue]
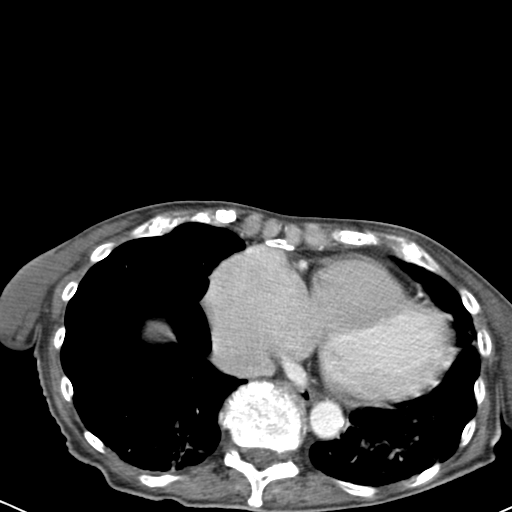

[Series 602: cor · coronal · 0.87mm/px · 3 of 95 slices shown]
[im 32/95  soft-tissue]
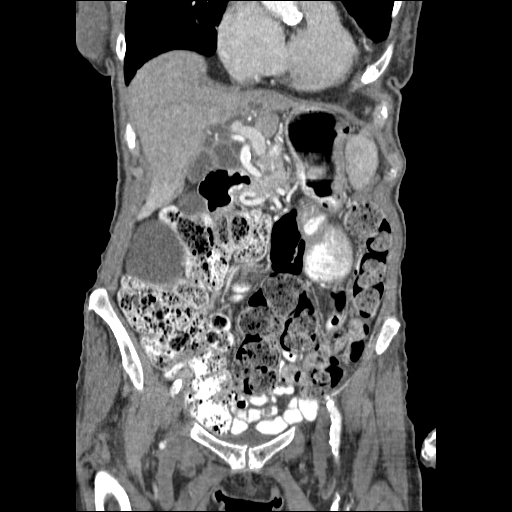
[im 42/95  soft-tissue]
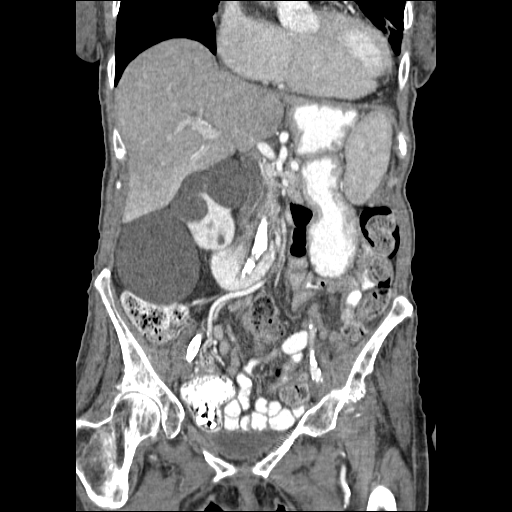
[im 53/95  soft-tissue]
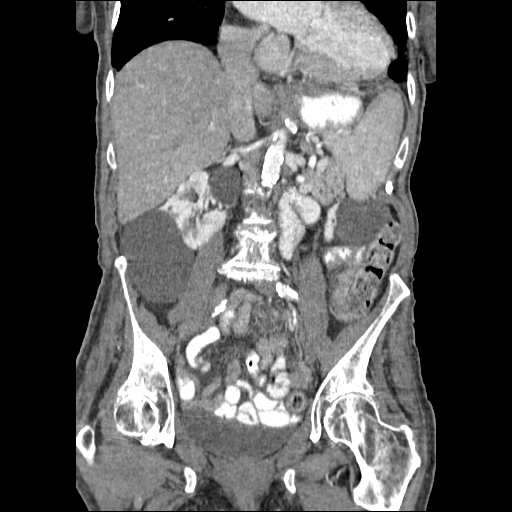

[16 of 46 positions shown; findings below may reference images not displayed]

FINDINGS: Images through the lung bases demonstrate cardiomegaly,
central airway thickening and peribronchial opacities at both lung
bases.  There is no significant pleural effusion.

There is atrophy within the left hepatic lobe associated with
intrahepatic biliary dilatation.  The common bile duct measures up
to 10 mm in diameter and appears dilated to the ampulla.  The
gallbladder appears to be present.  There is no biliary dilatation
within the right hepatic lobe. There is a 12 mm probable cyst
inferiorly in the right hepatic lobe.  No suspicious focal hepatic
mass is seen.

There is no evidence of pancreatic mass or significant pancreatic
ductal dilatation.  The spleen and adrenal glands appear normal.

There are multiple bilateral renal cysts.  These measure up to
cm on the right and 6.4 cm on the left.  There is no hydronephrosis
or enhancing renal mass.

There is extensive atherosclerosis.  The right superficial femoral
artery appears occluded within the proximal thigh.  No other large
vessel occlusion is seen.

There is a large amount of stool throughout the colon, especially
proximally.  No bowel wall thickening or focal mass is evident.

There is diffuse thoracolumbar spondylosis with posterior
osteophytes and facet disease.
IMPRESSION: 1.  Atrophy and intrahepatic biliary dilatation within the left
hepatic lobe are concerning for a central obstructing process,
potentially cholangiocarcinoma. The common bile duct is mildly
dilated. MRCP without and with contrast may be helpful for further
evaluation.
2.  No other specific evidence of intra-abdominal neoplasm.
3.  Multiple large bilateral renal cysts.
4.  Extensive atherosclerosis with occlusion of the right
superficial femoral artery.

5.  Chronic-appearing basilar lung disease.

## 2012-12-01 ENCOUNTER — Telehealth: Payer: Self-pay | Admitting: Internal Medicine

## 2012-12-01 NOTE — Telephone Encounter (Signed)
Patient is scheduled for 12/17/12

## 2012-12-17 ENCOUNTER — Ambulatory Visit: Payer: Medicare Other | Admitting: Internal Medicine

## 2013-01-24 ENCOUNTER — Emergency Department (HOSPITAL_COMMUNITY)
Admission: EM | Admit: 2013-01-24 | Discharge: 2013-01-25 | Disposition: A | Discharge status: Home / Self Care | Payer: Medicare Other | Attending: Emergency Medicine | Admitting: Emergency Medicine

## 2013-01-24 ENCOUNTER — Emergency Department (HOSPITAL_COMMUNITY): Payer: Medicare Other

## 2013-01-24 ENCOUNTER — Encounter (HOSPITAL_COMMUNITY): Payer: Self-pay | Admitting: *Deleted

## 2013-01-24 DIAGNOSIS — I10 Essential (primary) hypertension: Secondary | ICD-10-CM | POA: Insufficient documentation

## 2013-01-24 DIAGNOSIS — Z8679 Personal history of other diseases of the circulatory system: Secondary | ICD-10-CM | POA: Insufficient documentation

## 2013-01-24 DIAGNOSIS — Z7982 Long term (current) use of aspirin: Secondary | ICD-10-CM | POA: Insufficient documentation

## 2013-01-24 DIAGNOSIS — R109 Unspecified abdominal pain: Secondary | ICD-10-CM

## 2013-01-24 DIAGNOSIS — R0602 Shortness of breath: Secondary | ICD-10-CM | POA: Insufficient documentation

## 2013-01-24 DIAGNOSIS — Z87440 Personal history of urinary (tract) infections: Secondary | ICD-10-CM | POA: Insufficient documentation

## 2013-01-24 DIAGNOSIS — Z87448 Personal history of other diseases of urinary system: Secondary | ICD-10-CM | POA: Insufficient documentation

## 2013-01-24 DIAGNOSIS — Z8639 Personal history of other endocrine, nutritional and metabolic disease: Secondary | ICD-10-CM | POA: Insufficient documentation

## 2013-01-24 DIAGNOSIS — F329 Major depressive disorder, single episode, unspecified: Secondary | ICD-10-CM | POA: Insufficient documentation

## 2013-01-24 DIAGNOSIS — F17200 Nicotine dependence, unspecified, uncomplicated: Secondary | ICD-10-CM | POA: Insufficient documentation

## 2013-01-24 DIAGNOSIS — I2510 Atherosclerotic heart disease of native coronary artery without angina pectoris: Secondary | ICD-10-CM | POA: Insufficient documentation

## 2013-01-24 DIAGNOSIS — Z880 Allergy status to penicillin: Secondary | ICD-10-CM | POA: Insufficient documentation

## 2013-01-24 DIAGNOSIS — M129 Arthropathy, unspecified: Secondary | ICD-10-CM | POA: Insufficient documentation

## 2013-01-24 DIAGNOSIS — F3289 Other specified depressive episodes: Secondary | ICD-10-CM | POA: Insufficient documentation

## 2013-01-24 DIAGNOSIS — Z86718 Personal history of other venous thrombosis and embolism: Secondary | ICD-10-CM | POA: Insufficient documentation

## 2013-01-24 DIAGNOSIS — Z9861 Coronary angioplasty status: Secondary | ICD-10-CM | POA: Insufficient documentation

## 2013-01-24 DIAGNOSIS — I519 Heart disease, unspecified: Secondary | ICD-10-CM | POA: Insufficient documentation

## 2013-01-24 DIAGNOSIS — Z8659 Personal history of other mental and behavioral disorders: Secondary | ICD-10-CM | POA: Insufficient documentation

## 2013-01-24 DIAGNOSIS — R1013 Epigastric pain: Secondary | ICD-10-CM | POA: Insufficient documentation

## 2013-01-24 DIAGNOSIS — Z79899 Other long term (current) drug therapy: Secondary | ICD-10-CM | POA: Insufficient documentation

## 2013-01-24 DIAGNOSIS — N390 Urinary tract infection, site not specified: Secondary | ICD-10-CM | POA: Insufficient documentation

## 2013-01-24 DIAGNOSIS — R197 Diarrhea, unspecified: Secondary | ICD-10-CM

## 2013-01-24 DIAGNOSIS — G8929 Other chronic pain: Secondary | ICD-10-CM | POA: Insufficient documentation

## 2013-01-24 DIAGNOSIS — Z862 Personal history of diseases of the blood and blood-forming organs and certain disorders involving the immune mechanism: Secondary | ICD-10-CM | POA: Insufficient documentation

## 2013-01-24 HISTORY — DX: Diarrhea, unspecified: R19.7

## 2013-01-24 LAB — COMPREHENSIVE METABOLIC PANEL
ALT: 7 U/L (ref 0–35)
Albumin: 3.3 g/dL — ABNORMAL LOW (ref 3.5–5.2)
Alkaline Phosphatase: 108 U/L (ref 39–117)
Calcium: 8.8 mg/dL (ref 8.4–10.5)
GFR calc Af Amer: 57 mL/min — ABNORMAL LOW (ref >90)
Potassium: 4.5 mEq/L (ref 3.5–5.1)
Sodium: 136 mEq/L (ref 135–145)
Total Protein: 6.2 g/dL (ref 6.0–8.3)

## 2013-01-24 LAB — URINALYSIS, ROUTINE W REFLEX MICROSCOPIC
Bilirubin Urine: NEGATIVE
Glucose, UA: NEGATIVE mg/dL
Hgb urine dipstick: NEGATIVE
Specific Gravity, Urine: 1.023 (ref 1.005–1.030)
Urobilinogen, UA: 0.2 mg/dL (ref 0.0–1.0)
pH: 6.5 (ref 5.0–8.0)

## 2013-01-24 LAB — CBC WITH DIFFERENTIAL/PLATELET
Eosinophils Relative: 1 % (ref 0–5)
HCT: 32.9 % — ABNORMAL LOW (ref 36.0–46.0)
Lymphs Abs: 1.1 10*3/uL (ref 0.7–4.0)
MCH: 25.3 pg — ABNORMAL LOW (ref 26.0–34.0)
MCV: 84.1 fL (ref 78.0–100.0)
Monocytes Absolute: 1.6 10*3/uL — ABNORMAL HIGH (ref 0.1–1.0)
Neutro Abs: 10.4 10*3/uL — ABNORMAL HIGH (ref 1.7–7.7)
Platelets: 2075 10*3/uL (ref 150–400)
RBC: 3.91 MIL/uL (ref 3.87–5.11)

## 2013-01-24 LAB — POCT I-STAT TROPONIN I

## 2013-01-24 LAB — URINE MICROSCOPIC-ADD ON

## 2013-01-24 MED ORDER — MORPHINE SULFATE 4 MG/ML IJ SOLN
4.0000 mg | Freq: Once | INTRAMUSCULAR | Status: AC
  Administered 2013-01-24: 4 mg via INTRAVENOUS
  Filled 2013-01-24: qty 1

## 2013-01-24 MED ORDER — IOHEXOL 300 MG/ML  SOLN
50.0000 mL | Freq: Once | INTRAMUSCULAR | Status: AC | PRN
  Administered 2013-01-24: 50 mL via ORAL

## 2013-01-24 MED ORDER — SODIUM CHLORIDE 0.9 % IV SOLN
Freq: Once | INTRAVENOUS | Status: AC
  Administered 2013-01-24: 21:00:00 via INTRAVENOUS

## 2013-01-24 MED ORDER — IOHEXOL 300 MG/ML  SOLN
80.0000 mL | Freq: Once | INTRAMUSCULAR | Status: AC | PRN
  Administered 2013-01-24: 80 mL via INTRAVENOUS

## 2013-01-24 MED ORDER — MORPHINE SULFATE 2 MG/ML IJ SOLN
2.0000 mg | Freq: Once | INTRAMUSCULAR | Status: AC
  Administered 2013-01-24: 2 mg via INTRAVENOUS
  Filled 2013-01-24: qty 1

## 2013-01-24 MED ORDER — PANTOPRAZOLE SODIUM 40 MG IV SOLR
40.0000 mg | Freq: Once | INTRAVENOUS | Status: AC
  Administered 2013-01-24: 40 mg via INTRAVENOUS
  Filled 2013-01-24: qty 40

## 2013-01-24 NOTE — ED Notes (Signed)
ZOX:[id number]<AV> Expected date:<BR> Expected time:<BR> Means of arrival:<BR> Comments:<BR> hold

## 2013-01-24 NOTE — ED Notes (Signed)
EMS reports abd pain upper left, has history of abd pain and chronic diarrhea, recently at [facility] and admitted for same, no noted findings. No nausea or vomiting, diarrhea yesterday. Pt resides at [facility]

## 2013-01-24 NOTE — ED Provider Notes (Signed)
Medical screening examination/treatment/procedure(s) were conducted as a shared visit with non-physician practitioner(s) and myself.  I personally evaluated the patient during the encounter  Pt seen and examined. abd examined and nonacute--pt has known h/o thrombocytosis--will tx for uti, no signs of mesenteric ischemia  Toy Baker, MD 01/24/13 2309

## 2013-01-24 NOTE — ED Notes (Signed)
DNR purple sticker placed on pt's arm band per [facility] DNR order sheet.

## 2013-01-24 NOTE — ED Provider Notes (Signed)
CSN: [id number]     Arrival date & time 01/24/13  1843 History     First MD Initiated Contact with Patient 01/24/13 1907     Chief Complaint  Patient presents with  . Abdominal Pain   (Consider location/radiation/quality/duration/timing/severity/associated sxs/prior Treatment) Patient is a 79 y.o. female presenting with abdominal pain. The history is provided by the patient.  Abdominal Pain Pain location:  Epigastric Associated symptoms: diarrhea and shortness of breath   Associated symptoms: no chest pain, no cough, no dysuria, no fever, no nausea and no vomiting   Associated symptoms comment:  She complains of upper abdominal pain since this morning. She denies N, V. She has had a chronic problem with diarrhea for several weeks without change today. No fever. She has eaten today but very little secondary to pain. She states that the pain was unchanged with any PO intake. It does not radiate.    Past Medical History  Diagnosis Date  . Vascular abnormality   . DVT (deep venous thrombosis)   . Alcoholism   . Depression   . Hypertension   . UTI (lower urinary tract infection)   . Chronic back pain   . Arthritis     all over  . Renal disorder   . Heart disease   . Hyperlipidemia   . Phlebitis   . UTI (urinary tract infection)   . CAD (coronary artery disease)   . Diarrhea    Past Surgical History  Procedure Laterality Date  . Thrombectomy      R leg  . Shoulder surgery    . Hand surgery    . Appendectomy    . Coronary angioplasty with stent placement    . Spine surgery      x 3  . Exploratory laparotomy      associated with "trying to get pregnant" - low midline scar   Family History  Problem Relation Age of Onset  . Colon cancer Neg Hx   . Pancreatic cancer Sister    History  Substance Use Topics  . Smoking status: Current Every Day Smoker -- 0.25 packs/day    Types: Cigarettes  . Smokeless tobacco: Never Used  . Alcohol Use: No   OB History   Grav Para  Term Preterm Abortions TAB SAB Ect Mult Living                 Review of Systems  Constitutional: Negative.  Negative for fever.  Respiratory: Positive for shortness of breath. Negative for cough.   Cardiovascular: Negative.  Negative for chest pain.  Gastrointestinal: Positive for abdominal pain and diarrhea. Negative for nausea and vomiting.  Genitourinary: Negative.  Negative for dysuria.  Musculoskeletal: Negative.  Negative for myalgias.  Neurological: Negative.  Negative for weakness.  Psychiatric/Behavioral: Negative for confusion.    Allergies  Penicillins and Sulfa antibiotics  Home Medications   Current Outpatient Rx  Name  Route  Sig  Dispense  Refill  . albuterol (PROVENTIL) (2.5 MG/3ML) 0.083% nebulizer solution   Nebulization   Take 2.5 mg by nebulization every 4 (four) hours as needed for wheezing.         Marland Kitchen ALPRAZolam (XANAX) 0.25 MG tablet   Oral   Take 1 tablet (0.25 mg total) by mouth 2 (two) times daily.   60 tablet   0   . alum & mag hydroxide-simeth (MI-ACID) 200-200-20 MG/5ML suspension   Oral   Take 15 mLs by mouth 3 (three) times daily.         Marland Kitchen  amLODipine (NORVASC) 10 MG tablet   Oral   Take 10 mg by mouth daily with breakfast.          . aspirin 81 MG chewable tablet   Oral   Chew 81 mg by mouth daily with breakfast.         . collagenase (SANTYL) ointment   Topical   Apply topically daily. Apply to right ankle wound daily   15 g   0   . digoxin (LANOXIN) 0.125 MG tablet   Oral   Take 0.125 mg by mouth daily with breakfast.          . DULoxetine (CYMBALTA) 60 MG capsule   Oral   Take 1 capsule (60 mg total) by mouth daily.   30 capsule   0   . feeding supplement (ENSURE IMMUNE HEALTH) LIQD   Oral   Take 237 mLs by mouth 2 (two) times daily before lunch and supper.         . gabapentin (NEURONTIN) 300 MG capsule   Oral   Take 300 mg by mouth 2 (two) times daily.          Marland Kitchen HYDROcodone-acetaminophen  (NORCO/VICODIN) 5-325 MG per tablet   Oral   Take 1 tablet by mouth every 4 (four) hours as needed for pain. Pain   60 tablet   0   . isosorbide mononitrate (IMDUR) 60 MG 24 hr tablet   Oral   Take 60 mg by mouth daily with breakfast.          . lactobacillus (FLORANEX/LACTINEX) PACK   Oral   Take 1 g by mouth daily.          . lansoprazole (PREVACID) 30 MG capsule   Oral   Take 30 mg by mouth daily with breakfast.         . memantine (NAMENDA) 5 MG tablet   Oral   Take 5 mg by mouth 2 (two) times daily.         . mirtazapine (REMERON) 15 MG tablet   Oral   Take 7.5 mg by mouth at bedtime.         Marland Kitchen morphine (MS CONTIN) 15 MG 12 hr tablet   Oral   Take 3 tablets (45 mg total) by mouth 3 (three) times daily.   90 tablet   0   . Multiple Vitamins-Minerals (PRESERVISION AREDS PO)   Oral   Take 1 capsule by mouth 2 (two) times daily.         . polyethylene glycol (MIRALAX / GLYCOLAX) packet   Oral   Take 17 g by mouth daily.          . silver sulfADIAZINE (SILVADENE) 1 % cream   Topical   Apply 1 application topically every other day. With wound cleaning procedure          There were no vitals taken for this visit. Physical Exam  Constitutional: She is oriented to person, place, and time. She appears well-developed and well-nourished.  HENT:  Head: Normocephalic.  Mouth/Throat: Mucous membranes are dry.  Neck: Normal range of motion. Neck supple.  Cardiovascular: Normal rate and regular rhythm.   Pulmonary/Chest: Effort normal and breath sounds normal.  Abdominal: Soft. Bowel sounds are normal. There is no tenderness. There is no rebound and no guarding.  No reproducible tenderness to abdomen.  Musculoskeletal: Normal range of motion.  Neurological: She is alert and oriented to person, place, and time.  Skin: Skin is  warm and dry. No rash noted.  Psychiatric: She has a normal mood and affect.    ED Course   Procedures (including critical  care time)  Labs Reviewed  CBC WITH DIFFERENTIAL  COMPREHENSIVE METABOLIC PANEL  LIPASE, BLOOD  URINALYSIS, ROUTINE W REFLEX MICROSCOPIC   Results for orders placed during the hospital encounter of 01/24/13  CBC WITH DIFFERENTIAL      Result Value Range   WBC 13.2 (*) 4.0 - 10.5 K/uL   RBC 3.91  3.87 - 5.11 MIL/uL   Hemoglobin 9.9 (*) 12.0 - 15.0 g/dL   HCT 16.1 (*) 09.6 - 04.5 %   MCV 84.1  78.0 - 100.0 fL   MCH 25.3 (*) 26.0 - 34.0 pg   MCHC 30.1  30.0 - 36.0 g/dL   RDW 40.9 (*) 81.1 - 91.4 %   Platelets 2075 (*) 150 - 400 K/uL   Neutrophils Relative % 79 (*) 43 - 77 %   Lymphocytes Relative 8 (*) 12 - 46 %   Monocytes Relative 12  3 - 12 %   Eosinophils Relative 1  0 - 5 %   Basophils Relative 0  0 - 1 %   Neutro Abs 10.4 (*) 1.7 - 7.7 K/uL   Lymphs Abs 1.1  0.7 - 4.0 K/uL   Monocytes Absolute 1.6 (*) 0.1 - 1.0 K/uL   Eosinophils Absolute 0.1  0.0 - 0.7 K/uL   Basophils Absolute 0.0  0.0 - 0.1 K/uL   RBC Morphology ELLIPTOCYTES     Smear Review PENDING PATHOLOGIST REVIEW    URINALYSIS, ROUTINE W REFLEX MICROSCOPIC      Result Value Range   Color, Urine AMBER (*) YELLOW   APPearance CLEAR  CLEAR   Specific Gravity, Urine 1.023  1.005 - 1.030   pH 6.5  5.0 - 8.0   Glucose, UA NEGATIVE  NEGATIVE mg/dL   Hgb urine dipstick NEGATIVE  NEGATIVE   Bilirubin Urine NEGATIVE  NEGATIVE   Ketones, ur NEGATIVE  NEGATIVE mg/dL   Protein, ur 30 (*) NEGATIVE mg/dL   Urobilinogen, UA 0.2  0.0 - 1.0 mg/dL   Nitrite NEGATIVE  NEGATIVE   Leukocytes, UA MODERATE (*) NEGATIVE  URINE MICROSCOPIC-ADD ON      Result Value Range   Squamous Epithelial / LPF RARE  RARE   WBC, UA 21-50  <3 WBC/hpf   Casts HYALINE CASTS (*) NEGATIVE  COMPREHENSIVE METABOLIC PANEL      Result Value Range   Sodium 136  135 - 145 mEq/L   Potassium 4.5  3.5 - 5.1 mEq/L   Chloride 99  96 - 112 mEq/L   CO2 28  19 - 32 mEq/L   Glucose, Bld 78  70 - 99 mg/dL   BUN 23  6 - 23 mg/dL   Creatinine, Ser 7.82  0.50  - 1.10 mg/dL   Calcium 8.8  8.4 - 95.6 mg/dL   Total Protein 6.2  6.0 - 8.3 g/dL   Albumin 3.3 (*) 3.5 - 5.2 g/dL   AST 23  0 - 37 U/L   ALT 7  0 - 35 U/L   Alkaline Phosphatase 108  39 - 117 U/L   Total Bilirubin 0.4  0.3 - 1.2 mg/dL   GFR calc non Af Amer 49 (*) >90 mL/min   GFR calc Af Amer 57 (*) >90 mL/min  LIPASE, BLOOD      Result Value Range   Lipase 18  11 - 59 U/L  POCT  I-STAT TROPONIN I      Result Value Range   Troponin i, poc 0.01  0.00 - 0.08 ng/mL   Comment 3            Ct Abdomen Pelvis W Contrast  01/24/2013   *RADIOLOGY REPORT*  Clinical Data: Recurrent left abdominal pain.  History of chronic diarrhea and abdominal pain.  CT ABDOMEN AND PELVIS WITH CONTRAST  Technique:  Multidetector CT imaging of the abdomen and pelvis was performed following the standard protocol during bolus administration of intravenous contrast.  Contrast: 50mL OMNIPAQUE IOHEXOL 300 MG/ML  SOLN, 80mL OMNIPAQUE IOHEXOL 300 MG/ML  SOLN  Comparison: Acute abdominal series today, MRCP 08/26/2012 and abdominal pelvic CT 08/19/2012.  Findings: Images through the lung bases demonstrate chronic fibrosis and cardiomegaly.  There is no significant pleural or pericardial effusion.  Again demonstrated is chronic atrophy within the left hepatic lobe with associated intrahepatic biliary dilatation in the left lobe and chronic extrahepatic biliary dilatation.  Common bile duct measures 1.6 cm in diameter and two proves within the pancreatic head.  Previously demonstrated gallbladder wall thickening has improved.  No calcified stones are identified.  There is no significant dilatation within the right hepatic lobe.  No focal hepatic lesions are seen.  The pancreas is distorted and displaced anteriorly by adjacent right renal cysts.  There is no evidence of pancreatic mass or significant pancreatic ductal dilatation.  Moderate splenomegaly appears stable.  The adrenal glands appear normal.  Multiple large renal cysts are  again noted without significant change.  There is no hydronephrosis or delay in contrast excretion.  There is mild diffuse colonic distension consistent with an ileus. There is no evidence of bowel obstruction, perforation or extraluminal fluid collection.  Extensive aorto iliac atherosclerosis is noted.  Chronic stenosis within the iliac arteries is again suspected.  The uterus appears atrophied.  There is no adnexal mass or evidence of bladder abnormality.  Diffuse degenerative changes are again noted throughout the spine. No acute osseous findings are seen.  IMPRESSION:  1.  Stable atrophy and intrahepatic biliary dilatation within the left hepatic lobe and extrahepatic biliary dilatation.  A specific etiology for this was not demonstrated on the MRCP performed 5 months ago. 2.  Stable splenomegaly. 3.  Stable large renal cysts bilaterally. 4.  No evidence of bowel obstruction or acute inflammatory process. 5.  Extensive atherosclerosis.   Original Report Authenticated By: Carey Bullocks, M.D.   Dg Abd Acute W/chest  01/24/2013   *RADIOLOGY REPORT*  Clinical Data: Acute abdominal pain.  Shortness of breath. Weakness.  ACUTE ABDOMEN SERIES (ABDOMEN 2 VIEW & CHEST 1 VIEW)  Comparison: CT 08/19/2012 abdomen pelvis.  Chest film 05/21/2012.  Findings: Frontal view of the chest demonstrates glenohumeral joint osteoarthritis bilaterally.  Left great vessels stent. Midline trachea.  Moderate cardiomegaly.  Mild right hemidiaphragm elevation. No pleural effusion or pneumothorax.  No congestive failure.  Skin fold over the right hemithorax.  Diffuse interstitial thickening.  Abominal films demonstrate no free intraperitoneal air on right- sided decubitus positioning.  There are air-fluid levels throughout the right lower quadrant.  Supine view demonstrates gas within primarily colon.  There may be a small bowel dilatation within the left side of the abdomen.  Right-sided common iliac stent.  No pneumatosis.  IMPRESSION:  Nonspecific bowel gas pattern.  Right lower quadrant air-fluid levels with primarily gas filled colon.  Cannot exclude small bowel dilatation in the left side of the abdomen.  Question adynamic ileus versus distal  obstruction.  Cardiomegaly, without acute disease in the chest.   Original Report Authenticated By: Jeronimo Greaves, M.D.   Date: 01/25/2013  Rate: 72  Rhythm: atrial fibrillation  QRS Axis: normal  Intervals: normal  ST/T Wave abnormalities: normal  Conduction Disutrbances:none  Narrative Interpretation:   Old EKG Reviewed: none available   No results found. No diagnosis found. 1. Abdominal pain 2. diarrhea MDM  CT scan, x-ray negative. Patient with marked thrombocytosis with history of same. Dr. Freida Busman in to see patient and discussed CT abd/pel findings with radiology who advises that it is an adequate study showing no ischemia. Will treat for UTI with IV rocephin. Anticipate discharge home.  Troponin and EKG negative. Re-eval:  Patient is comfortable. Discussed symptoms of diarrhea (persistent for months - no diarrhea here) and abdominal pain. All tests negative with exception of UTI. No fever. Normal appetite. Feel she is stable for discharge.   Arnoldo Hooker, PA-C 01/25/13 0005

## 2013-01-24 NOTE — ED Notes (Signed)
Patient transported to X-ray 

## 2013-01-25 MED ORDER — CEPHALEXIN 500 MG PO CAPS
500.0000 mg | ORAL_CAPSULE | Freq: Once | ORAL | Status: AC
  Administered 2013-01-25: 500 mg via ORAL
  Filled 2013-01-25: qty 1

## 2013-01-25 MED ORDER — CEPHALEXIN 500 MG PO CAPS: 500.0000 mg | ORAL_CAPSULE | Freq: Three times a day (TID) | ORAL | Status: DC

## 2013-01-25 NOTE — ED Notes (Addendum)
Gave report to [place] at [facility]  to dispatch PTAR for this patient to be transported back to [facility]

## 2013-01-26 LAB — PATHOLOGIST SMEAR REVIEW

## 2013-01-29 NOTE — ED Provider Notes (Signed)
Medical screening examination/treatment/procedure(s) were performed by non-physician practitioner and as supervising physician I was immediately available for consultation/collaboration.  Livy Ross T Hance Caspers, MD 01/29/13 1219 

## 2013-02-09 ENCOUNTER — Emergency Department (HOSPITAL_COMMUNITY)
Admission: EM | Admit: 2013-02-09 | Discharge: 2013-02-09 | Disposition: A | Discharge status: Home / Self Care | Payer: Medicare Other | Attending: Emergency Medicine | Admitting: Emergency Medicine

## 2013-02-09 ENCOUNTER — Encounter (HOSPITAL_COMMUNITY): Payer: Self-pay | Admitting: Emergency Medicine

## 2013-02-09 DIAGNOSIS — R899 Unspecified abnormal finding in specimens from other organs, systems and tissues: Secondary | ICD-10-CM

## 2013-02-09 DIAGNOSIS — Z8639 Personal history of other endocrine, nutritional and metabolic disease: Secondary | ICD-10-CM | POA: Insufficient documentation

## 2013-02-09 DIAGNOSIS — Z880 Allergy status to penicillin: Secondary | ICD-10-CM | POA: Insufficient documentation

## 2013-02-09 DIAGNOSIS — Z79899 Other long term (current) drug therapy: Secondary | ICD-10-CM | POA: Insufficient documentation

## 2013-02-09 DIAGNOSIS — G8929 Other chronic pain: Secondary | ICD-10-CM | POA: Insufficient documentation

## 2013-02-09 DIAGNOSIS — F17200 Nicotine dependence, unspecified, uncomplicated: Secondary | ICD-10-CM | POA: Insufficient documentation

## 2013-02-09 DIAGNOSIS — Z862 Personal history of diseases of the blood and blood-forming organs and certain disorders involving the immune mechanism: Secondary | ICD-10-CM | POA: Insufficient documentation

## 2013-02-09 DIAGNOSIS — Z7982 Long term (current) use of aspirin: Secondary | ICD-10-CM | POA: Insufficient documentation

## 2013-02-09 DIAGNOSIS — M129 Arthropathy, unspecified: Secondary | ICD-10-CM | POA: Insufficient documentation

## 2013-02-09 DIAGNOSIS — Z87448 Personal history of other diseases of urinary system: Secondary | ICD-10-CM | POA: Insufficient documentation

## 2013-02-09 DIAGNOSIS — Z87440 Personal history of urinary (tract) infections: Secondary | ICD-10-CM | POA: Insufficient documentation

## 2013-02-09 DIAGNOSIS — Z8679 Personal history of other diseases of the circulatory system: Secondary | ICD-10-CM | POA: Insufficient documentation

## 2013-02-09 DIAGNOSIS — E875 Hyperkalemia: Secondary | ICD-10-CM | POA: Insufficient documentation

## 2013-02-09 DIAGNOSIS — I10 Essential (primary) hypertension: Secondary | ICD-10-CM | POA: Insufficient documentation

## 2013-02-09 DIAGNOSIS — I2510 Atherosclerotic heart disease of native coronary artery without angina pectoris: Secondary | ICD-10-CM | POA: Insufficient documentation

## 2013-02-09 DIAGNOSIS — Z86718 Personal history of other venous thrombosis and embolism: Secondary | ICD-10-CM | POA: Insufficient documentation

## 2013-02-09 DIAGNOSIS — F3289 Other specified depressive episodes: Secondary | ICD-10-CM | POA: Insufficient documentation

## 2013-02-09 DIAGNOSIS — Z9861 Coronary angioplasty status: Secondary | ICD-10-CM | POA: Insufficient documentation

## 2013-02-09 DIAGNOSIS — F329 Major depressive disorder, single episode, unspecified: Secondary | ICD-10-CM | POA: Insufficient documentation

## 2013-02-09 LAB — BASIC METABOLIC PANEL
Calcium: 8.8 mg/dL (ref 8.4–10.5)
Chloride: 99 mEq/L (ref 96–112)
Creatinine, Ser: 0.97 mg/dL (ref 0.50–1.10)
GFR calc Af Amer: 59 mL/min — ABNORMAL LOW (ref >90)
Sodium: 135 mEq/L (ref 135–145)

## 2013-02-09 LAB — CBC WITH DIFFERENTIAL/PLATELET
Eosinophils Absolute: 0.3 10*3/uL (ref 0.0–0.7)
Eosinophils Relative: 2 % (ref 0–5)
Hemoglobin: 11.1 g/dL — ABNORMAL LOW (ref 12.0–15.0)
Lymphocytes Relative: 8 % — ABNORMAL LOW (ref 12–46)
MCH: 25.8 pg — ABNORMAL LOW (ref 26.0–34.0)
Monocytes Absolute: 1.6 10*3/uL — ABNORMAL HIGH (ref 0.1–1.0)
Neutrophils Relative %: 79 % — ABNORMAL HIGH (ref 43–77)
Platelets: 2418 10*3/uL (ref 150–400)
RBC: 4.31 MIL/uL (ref 3.87–5.11)

## 2013-02-09 LAB — MAGNESIUM: Magnesium: 2.4 mg/dL (ref 1.5–2.5)

## 2013-02-09 LAB — DIGOXIN LEVEL: Digoxin Level: 1.2 ng/mL (ref 0.8–2.0)

## 2013-02-09 MED ORDER — MORPHINE SULFATE 4 MG/ML IJ SOLN
4.0000 mg | Freq: Once | INTRAMUSCULAR | Status: AC
  Administered 2013-02-09: 4 mg via INTRAVENOUS
  Filled 2013-02-09: qty 1

## 2013-02-09 MED ORDER — INSULIN REGULAR BOLUS VIA INFUSION
6.0000 [IU] | Freq: Once | INTRAVENOUS | Status: DC
  Filled 2013-02-09: qty 6

## 2013-02-09 MED ORDER — SODIUM POLYSTYRENE SULFONATE 15 GM/60ML PO SUSP: 30.0000 g | Freq: Once | ORAL | Status: DC

## 2013-02-09 MED ORDER — SODIUM BICARBONATE 4 % IV SOLN: 5.0000 mL | Freq: Once | INTRAVENOUS | Status: DC

## 2013-02-09 MED ORDER — DEXTROSE 50 % IV SOLN: 1.0000 | Freq: Once | INTRAVENOUS | Status: DC

## 2013-02-09 NOTE — ED Provider Notes (Signed)
CSN: [id number]     Arrival date & time 02/09/13  1427 History     First MD Initiated Contact with Patient 02/09/13 1427     Chief Complaint  Patient presents with  . Abnormal Lab   (Consider location/radiation/quality/duration/timing/severity/associated sxs/prior Treatment) HPI Comments: Pt brought in to the ED with cc of elevated potassium. Pt has hx of CAD, dysrhythmias, and is on digoxin. Pt reportedly had elevated K at nursing home, she was given kayexalate, and repeat K today is at 6.7. She denies any palpitations, chest pain, dib, muscle aches.  The history is provided by the patient and medical records.    Past Medical History  Diagnosis Date  . Vascular abnormality   . DVT (deep venous thrombosis)   . Alcoholism   . Depression   . Hypertension   . UTI (lower urinary tract infection)   . Chronic back pain   . Arthritis     all over  . Renal disorder   . Heart disease   . Hyperlipidemia   . Phlebitis   . UTI (urinary tract infection)   . CAD (coronary artery disease)   . Diarrhea    Past Surgical History  Procedure Laterality Date  . Thrombectomy      R leg  . Shoulder surgery    . Hand surgery    . Appendectomy    . Coronary angioplasty with stent placement    . Spine surgery      x 3  . Exploratory laparotomy      associated with "trying to get pregnant" - low midline scar   Family History  Problem Relation Age of Onset  . Colon cancer Neg Hx   . Pancreatic cancer Sister    History  Substance Use Topics  . Smoking status: Current Every Day Smoker -- 0.25 packs/day    Types: Cigarettes  . Smokeless tobacco: Never Used  . Alcohol Use: No   OB History   Grav Para Term Preterm Abortions TAB SAB Ect Mult Living                 Review of Systems  Constitutional: Negative for fever, chills, activity change and fatigue.  HENT: Negative for neck pain.   Respiratory: Negative for shortness of breath.   Cardiovascular: Negative for chest pain and  palpitations.  Gastrointestinal: Positive for diarrhea. Negative for nausea, vomiting and abdominal pain.  Genitourinary: Negative for dysuria.  Neurological: Negative for headaches.    Allergies  Penicillins and Sulfa antibiotics  Home Medications   Current Outpatient Rx  Name  Route  Sig  Dispense  Refill  . albuterol (PROVENTIL) (2.5 MG/3ML) 0.083% nebulizer solution   Nebulization   Take 2.5 mg by nebulization every 4 (four) hours as needed for wheezing.         Marland Kitchen ALPRAZolam (XANAX) 0.25 MG tablet   Oral   Take 1 tablet (0.25 mg total) by mouth 2 (two) times daily.   60 tablet   0   . alum & mag hydroxide-simeth (MI-ACID) 200-200-20 MG/5ML suspension   Oral   Take 15 mLs by mouth 3 (three) times daily.         Marland Kitchen amLODipine (NORVASC) 10 MG tablet   Oral   Take 10 mg by mouth daily with breakfast.          . aspirin 81 MG chewable tablet   Oral   Chew 81 mg by mouth daily with breakfast.         .  cephALEXin (KEFLEX) 500 MG capsule   Oral   Take 1 capsule (500 mg total) by mouth 3 (three) times daily.   21 capsule   0   . collagenase (SANTYL) ointment   Topical   Apply topically daily. Apply to right ankle wound daily   15 g   0   . digoxin (LANOXIN) 0.125 MG tablet   Oral   Take 0.125 mg by mouth daily with breakfast.          . DULoxetine (CYMBALTA) 60 MG capsule   Oral   Take 1 capsule (60 mg total) by mouth daily.   30 capsule   0   . feeding supplement (ENSURE IMMUNE HEALTH) LIQD   Oral   Take 237 mLs by mouth 2 (two) times daily before lunch and supper.         . gabapentin (NEURONTIN) 300 MG capsule   Oral   Take 300 mg by mouth 2 (two) times daily.          Marland Kitchen HYDROcodone-acetaminophen (NORCO/VICODIN) 5-325 MG per tablet   Oral   Take 1 tablet by mouth every 4 (four) hours as needed for pain. Pain   60 tablet   0   . isosorbide mononitrate (IMDUR) 60 MG 24 hr tablet   Oral   Take 60 mg by mouth daily with breakfast.           . lactobacillus (FLORANEX/LACTINEX) PACK   Oral   Take 1 g by mouth daily.          . lansoprazole (PREVACID) 30 MG capsule   Oral   Take 30 mg by mouth daily with breakfast.         . memantine (NAMENDA) 5 MG tablet   Oral   Take 5 mg by mouth 2 (two) times daily.         . mirtazapine (REMERON) 15 MG tablet   Oral   Take 7.5 mg by mouth at bedtime.         Marland Kitchen morphine (MS CONTIN) 15 MG 12 hr tablet   Oral   Take 3 tablets (45 mg total) by mouth 3 (three) times daily.   90 tablet   0   . Multiple Vitamins-Minerals (PRESERVISION AREDS PO)   Oral   Take 1 capsule by mouth 2 (two) times daily.         . polyethylene glycol (MIRALAX / GLYCOLAX) packet   Oral   Take 17 g by mouth daily.          . silver sulfADIAZINE (SILVADENE) 1 % cream   Topical   Apply 1 application topically every other day. With wound cleaning procedure          BP 132/67  Pulse 69  Temp(Src) 97.1 F (36.2 C) (Oral)  Resp 16  SpO2 94% Physical Exam  Nursing note and vitals reviewed. Constitutional: She is oriented to person, place, and time. She appears well-developed and well-nourished.  HENT:  Head: Normocephalic and atraumatic.  Eyes: EOM are normal. Pupils are equal, round, and reactive to light.  Neck: Neck supple.  Cardiovascular: Normal rate and regular rhythm.   Murmur heard. Pulmonary/Chest: Effort normal. No respiratory distress.  Abdominal: Soft. She exhibits no distension. There is no tenderness. There is no rebound and no guarding.  Neurological: She is alert and oriented to person, place, and time.  Skin: Skin is warm and dry.    ED Course   Procedures (including critical care  time)  Labs Reviewed  CBC WITH DIFFERENTIAL  BASIC METABOLIC PANEL  MAGNESIUM  DIGOXIN LEVEL   No results found. No diagnosis found.  MDM   Date: 02/09/2013  Rate: 73  Rhythm: afib  QRS Axis: normal  Intervals: QT prolonged  ST/T Wave abnormalities: nonspecific ST/T  changes  Conduction Disutrbances:none  Narrative Interpretation:   Old EKG Reviewed: none available   Pt comes in with cc of hyperkalemia. Pt is on dig-  So we will get the K+ level, and refrain from Calcium Gluconate. Unsure what the cause is at this time. Will check Kidney function as well. Pt is asymptomatic.  6:44 PM K+ is WNL here. Spoke with the [facility], and informed them that t he K+ is normal here, with no symptoms. Pt to be returned.    Derwood Kaplan, MD 02/09/13 1845

## 2013-02-09 NOTE — ED Notes (Signed)
Per EMS pt came from [facility] for elevated potassium. Pt had labs done 8/20 and K+ was 6.5, pt placed on Kayexalate. Today K+ was 6.7. Pt has also had diarrhea since Friday. Denies pain or other symptoms.

## 2013-02-09 NOTE — ED Notes (Signed)
Pt has no complaints

## 2013-02-09 NOTE — ED Notes (Signed)
Critical platelet level from lab: 2418

## 2013-02-23 ENCOUNTER — Telehealth: Payer: Self-pay | Admitting: Internal Medicine

## 2013-02-23 NOTE — Telephone Encounter (Signed)
Left message for patient to call back  

## 2013-02-24 NOTE — Telephone Encounter (Signed)
Patient is scheduled for Jessica Zehr, PA tomorrow at 11:00 

## 2013-02-25 ENCOUNTER — Encounter: Payer: Self-pay | Admitting: Gastroenterology

## 2013-02-25 ENCOUNTER — Ambulatory Visit (INDEPENDENT_AMBULATORY_CARE_PROVIDER_SITE_OTHER): Payer: Medicare Other | Admitting: Gastroenterology

## 2013-02-25 VITALS — BP 90/40 | HR 47

## 2013-02-25 DIAGNOSIS — R197 Diarrhea, unspecified: Secondary | ICD-10-CM

## 2013-02-25 MED ORDER — OMEPRAZOLE 40 MG PO CPDR: 40.0000 mg | DELAYED_RELEASE_CAPSULE | Freq: Every day | ORAL | Status: AC

## 2013-02-25 NOTE — Progress Notes (Signed)
02/25/2013 Tamara Turner [id number] [birth year]   History of Present Illness:  Patient is an 79 year old female who is a patient of Dr. Marvell Fuller.  She is a resident at [facility] and comes in today with her niece for complaints of chronic diarrhea.  She says that the diarrhea has been present for several months.  Has watery/liquid diarrhea just about every day with at least 3 BM's per day.  Says that there is a lot of gas with it but no blood.  Gets abdominal cramps before a BM but is relieved by moving her bowels for the most part.  Says that her appetite is not very good and she is not eating much partly because the food is not good at the facility and partly because she is afraid to eat and then have diarrhea.  She says that dairy products bother her stomach but says that she does not eat or drink them much.  She's also had a significant amount of weight loss (approximately 13 pounds in the past 1.5 years) and likely some prior to that.  She had a CT scan of the abdomen and pelvis with contrast in August, which did not show any acute abnormalities or causes of her diarrhea.    Just of note, she had an EGD by Dr. Leone Payor in 03/2012 at which time the study was normal.  This was performed for reports of epigastric abdominal pain and weight loss at that time, which triggered the first of 3 CT scans this year.    Current Medications, Allergies, Past Medical History, Past Surgical History, Family History and Social History were reviewed in [facility] record.   Physical Exam: BP 90/40  Pulse 47 General:  Thin elderly white female in no acute distress; in wheelchair Head: Normocephalic and atraumatic Eyes:  sclerae anicteric, conjunctiva pink  Ears: Normal auditory acuity Lungs: Clear throughout to auscultation Heart:  Irregularly irregular Abdomen: Soft, non-distended.  Normal bowel sounds.  Non-tender. Musculoskeletal: Symmetrical with no gross deformities   Extremities: No edema  Neurological: Alert oriented x 4, grossly nonfocal Psychological:  Alert and cooperative. Normal mood and affect  Assessment and Recommendations: -Diarrhea, chronic for several months.  Will place her on a gluten free diet since she says that dairy products bother her.  Will check celiac titers.  Will follow-up results of stool studies that were collected at [facility] (Cdiff, culture, and O&P).  Will discontinue her prevacid and change to omeprazole 40 mg daily to see if this helps since prevacid can frequently cause diarrhea.  She is also on Namenda, which is known for diarrhea as well.  Will increase questran to BID.  Follow-up in approximately 4 weeks.

## 2013-02-25 NOTE — Patient Instructions (Addendum)
Please follow up with Dr Leone Payor in 4 weeks Lactose free diet, can have powdered coffee creamer You need to have celiac labs, TTG and IGA  Discontinue your prevacid and change to omeprazole 40 mg daily Increase cholestyramine to twice daily We will follow up on your stool studies completed at [facility] CC:  Tamara Housekeeper MD

## 2013-02-25 NOTE — Progress Notes (Signed)
Agree w/ plans made by Ms. Cristi Loron.

## 2013-03-31 ENCOUNTER — Encounter: Payer: Self-pay | Admitting: Internal Medicine

## 2013-03-31 ENCOUNTER — Ambulatory Visit: Payer: Medicare Other | Admitting: Internal Medicine

## 2013-03-31 ENCOUNTER — Telehealth: Payer: Self-pay | Admitting: Internal Medicine

## 2013-03-31 ENCOUNTER — Ambulatory Visit (INDEPENDENT_AMBULATORY_CARE_PROVIDER_SITE_OTHER): Payer: Medicare Other | Admitting: Internal Medicine

## 2013-03-31 VITALS — BP 122/60 | HR 80 | Ht 67.0 in | Wt 126.5 lb

## 2013-03-31 DIAGNOSIS — K589 Irritable bowel syndrome without diarrhea: Secondary | ICD-10-CM | POA: Insufficient documentation

## 2013-03-31 HISTORY — DX: Irritable bowel syndrome, unspecified: K58.9

## 2013-03-31 NOTE — Progress Notes (Signed)
  Subjective:    Patient ID: Tamara Turner, female    DOB: 1921/07/03, 79 y.o.   MRN: [id number]  HPI The patient is here for follow-up - she saw Doug Sou, PA-C for diarrhea 1 month ago. Since then she is much better with less diarrhea. No etiology found though the patient indicates she was given diarrhea. Her niece and power of attorney is here with her today and provide some history as well. Medications, allergies, past medical history, past surgical history, family history and social history are reviewed and updated in the EMR. Review of Systems + dyspnea, + urinary frequency    Objective:   Physical Exam General:  NAD Eyes:   anicteric Lungs:  clear Heart:  S1S2 no rubs, murmurs or gallops Ext:   1-2+ edema Neuro: A & O x 3    Assessment & Plan:   1. IBS (irritable bowel syndrome)

## 2013-03-31 NOTE — Patient Instructions (Signed)
The diagnosis is IBS - Irritable Bowel syndrome. When I reviewed records from [facility] when we met last year it stated you had years of recurrent abdominal pain, and changes in bowel habits.  I have ordered that Lialda be discontinued.  Since your bowels are more regular and you are not having diarrhea no other changes and I will see you as needed.  I appreciate the opportunity to care for you. Iva Boop, MD, [facility]

## 2013-03-31 NOTE — Assessment & Plan Note (Signed)
It sounds like she is better. She was having diarrhea problems some adjustment medications including increasing cholestyramine made a difference. I don't see any evidence that this lady has inflammatory bowel disease, it looks like she has been on Lialda, I am stopping that. She will see me as needed.

## 2013-05-04 ENCOUNTER — Encounter: Payer: Self-pay | Admitting: Nurse Practitioner

## 2013-05-04 ENCOUNTER — Ambulatory Visit (INDEPENDENT_AMBULATORY_CARE_PROVIDER_SITE_OTHER): Payer: Medicare Other | Admitting: Nurse Practitioner

## 2013-05-04 VITALS — BP 170/80 | HR 82 | Ht 67.0 in | Wt 126.0 lb

## 2013-05-04 DIAGNOSIS — R609 Edema, unspecified: Secondary | ICD-10-CM

## 2013-05-04 MED ORDER — AMLODIPINE BESYLATE 10 MG PO TABS: 5.0000 mg | ORAL_TABLET | Freq: Every day | ORAL | Status: DC

## 2013-05-04 NOTE — Patient Instructions (Addendum)
Cut the Norvasc in half to 5 mg a day  Start Lasix 40 mg BID   Needs to be seen by the house doctor regarding anemia and thrombocytosis  Needs to have daily BP checks and daily weights if possible  Elevate legs as much as possible  Call the [facility] office at [phone] if you have any questions, problems or concerns.

## 2013-05-04 NOTE — Progress Notes (Signed)
Tamara Turner Date of Birth: 09/18/1921 Medical Record #[id number]  History of Present Illness: Ms. Asch is seen back today for a work in visit. Seen for Dr. [name]. She is 79 years of age. Has HTN, chronic AF, CAD with past stenting (done in [place]), and DVT. Poor candidate for anticoagulation. She is basically confined to a wheelchair. Still smokes. Frequent falls. Other issues noted included alcoholism, depression, HTN, OA and phlebitis. Appears to have thrombocytosis as well.   Last seen back in October of 2013 - seemed to be doing ok but with multiple medical issues. This visit was to establish her care here in [place]. She has not been seen since. She was confused about her medicines. Coumadin was not felt to be safe given her medical issues and frequent falling.   Comes back today. Here with her niece, Ivor Costa - power of attorney.She is confined to a wheelchair. Has marked thrombocytosis noted. She is doing poorly. I really do not have much history provided or sent. She says she is short of breath. On oxygen all the time - but not able to travel with it due to the size of the tank. Poor nutrition. No real chest pain but seems to have a chronic chest pain syndrome. She is having more swelling. Apparently weight was up at the nursing home to 141 - usually 126 - but 126 here. Has more swelling in arms and legs. On Norvasc. No longer on Hydrea - not able to tell me why she is not taking. Not followed by hematology. Labs today showed hgb of 9.2. Plt of 2069. Potsssium 6.2. She was treated with Lasix 20 and Kayexalate.    Current Outpatient Prescriptions  Medication Sig Dispense Refill  . albuterol (PROVENTIL) (2.5 MG/3ML) 0.083% nebulizer solution Take 2.5 mg by nebulization every 4 (four) hours as needed for wheezing.      Marland Kitchen dextromethorphan 15 MG/5ML syrup Take 10 mLs by mouth 4 (four) times daily as needed for cough.      . gabapentin (NEURONTIN) 300 MG capsule Take 300 mg by  mouth 2 (two) times daily.       Marland Kitchen HYDROcodone-acetaminophen (NORCO/VICODIN) 5-325 MG per tablet Take 1 tablet by mouth every 4 (four) hours as needed for pain. Pain  60 tablet  0  . loperamide (IMODIUM A-D) 2 MG tablet Take 2 mg by mouth 4 (four) times daily as needed for diarrhea or loose stools.      . memantine (NAMENDA) 5 MG tablet Take 5 mg by mouth 2 (two) times daily.      . Multiple Vitamins-Minerals (PRESERVISION AREDS PO) Take 1 capsule by mouth 2 (two) times daily.      . polyethylene glycol (MIRALAX / GLYCOLAX) packet Take 17 g by mouth daily as needed (constipation).       . promethazine (PHENERGAN) 12.5 MG tablet Take 12.5 mg by mouth every 6 (six) hours as needed for nausea.      . psyllium (METAMUCIL) 58.6 % packet Take 1 packet by mouth daily.      . traMADol (ULTRAM) 50 MG tablet Take 50 mg by mouth 2 (two) times daily as needed for pain.      Marland Kitchen ALPRAZolam (XANAX) 0.25 MG tablet Take 0.25 mg by mouth 2 (two) times daily.      Marland Kitchen alum & mag hydroxide-simeth (MI-ACID) 200-200-20 MG/5ML suspension Take 15 mLs by mouth 3 (three) times daily.      Marland Kitchen amLODipine (NORVASC) 10 MG tablet Take  10 mg by mouth daily with breakfast.       . aspirin 81 MG chewable tablet Chew 81 mg by mouth daily with breakfast.      . cholestyramine (QUESTRAN) 4 G packet       . digoxin (LANOXIN) 0.125 MG tablet Take 0.125 mg by mouth daily with breakfast.       . DULoxetine (CYMBALTA) 60 MG capsule Take 60 mg by mouth daily.      . isosorbide mononitrate (IMDUR) 60 MG 24 hr tablet Take 60 mg by mouth daily with breakfast.       . lactobacillus (FLORANEX/LACTINEX) PACK Take 1 g by mouth daily.       . mirtazapine (REMERON) 7.5 MG tablet Take 7.5 mg by mouth at bedtime.      Marland Kitchen morphine (MS CONTIN) 15 MG 12 hr tablet Take 15 mg by mouth 3 (three) times daily. With 30mg  tablet to equal 5mg  three times daily      . omeprazole (PRILOSEC) 40 MG capsule Take 1 capsule (40 mg total) by mouth daily.  90 capsule  3    No current facility-administered medications for this visit.    Allergies  Allergen Reactions  . Flagyl [Metronidazole] Nausea Only  . Penicillins Hives  . Sulfa Antibiotics Nausea Only    Past Medical History  Diagnosis Date  . Vascular abnormality   . DVT (deep venous thrombosis)   . Alcoholism   . Depression   . Hypertension   . UTI (lower urinary tract infection)   . Chronic back pain   . Arthritis     all over  . Renal disorder   . Heart disease   . Hyperlipidemia   . Phlebitis   . UTI (urinary tract infection)   . CAD (coronary artery disease)   . Diarrhea   . Acute cholecystitis March 2014  . Cellulitis of foot 05/19/2012  . Acute osteomyelitis, lower leg 05/21/2012    Past Surgical History  Procedure Laterality Date  . Thrombectomy      R leg  . Shoulder surgery    . Hand surgery    . Appendectomy    . Coronary angioplasty with stent placement    . Spine surgery      x 3  . Exploratory laparotomy      associated with "trying to get pregnant" - low midline scar    History  Smoking status  . Current Every Day Smoker -- 0.25 packs/day  . Types: Cigarettes  Smokeless tobacco  . Never Used    History  Alcohol Use No    Family History  Problem Relation Age of Onset  . Colon cancer Neg Hx   . Pancreatic cancer Sister     Review of Systems: The review of systems is per the HPI.  All other systems were reviewed and are negative.  Physical Exam: BP 170/80  Pulse 82  Ht 5\' 7"  (1.702 m)  Wt 126 lb (57.153 kg)  BMI 19.73 kg/m2  SpO2 90% Patient is an elderly female who is in no acute distress. She looks very ill. Very sallow. She is in a wheelchair. Skin is warm and dry. Color quite sallow.  HEENT is unremarkable. Normocephalic/atraumatic. PERRL. Sclera are nonicteric. Neck is supple. No masses. No JVD. Lungs are clear. Cardiac exam shows a regular rate and rhythm. Abdomen is soft. Extremities are with over 2+ edema in the legs. Pockets of fluid  in the arms.  No gross neurologic deficits  noted.  LABORATORY DATA:  Lab Results  Component Value Date   WBC 15.7* 02/09/2013   HGB 11.1* 02/09/2013   HCT 36.0 02/09/2013   PLT 2418* 02/09/2013   GLUCOSE 93 02/09/2013   ALT 7 01/24/2013   AST 23 01/24/2013   NA 135 02/09/2013   K 4.6 02/09/2013   CL 99 02/09/2013   CREATININE 0.97 02/09/2013   BUN 31* 02/09/2013   CO2 28 02/09/2013   TSH 1.506 08/20/2012   INR 1.41 08/23/2012    Echo Study Conclusions from November 2013  - Left ventricle: The cavity size was normal. Wall thickness was normal. Systolic function was normal. The estimated ejection fraction was in the range of 55% to 60%. Wall motion was normal; there were no regional wall motion abnormalities. The study is not technically sufficient to allow evaluation of LV diastolic function. - Aortic valve: Mild regurgitation. - Mitral valve: Mild prolapse, involving the anterior leaflet. Mild regurgitation. - Left atrium: The atrium was moderately dilated. - Right atrium: The atrium was mildly dilated. - Atrial septum: There was increased thickness of the septum, consistent with lipomatous hypertrophy. - Tricuspid valve: Moderate regurgitation. - Pulmonary arteries: Systolic pressure was moderately increased. PA peak pressure: 51mm Hg (S).  Assessment / Plan: 1. Edema - probably multifactorial - not sure we have much to add - would favor a conservative approach - Lasix 40 mg BID. Labs to be followed by the house doctor.   2. Anemia with thrombocytosis - not sure what the plan of care is - would defer to the house doctor.   3. Multiple medical issues - she looks terminally ill to me. Not sure we have much to offer unfortunately.   4. Chronic atrial fib - poor candidate for anticoagulation  5. HTN - she is on Norvasc - will cut that back to 5mg  - Lasix added back - will need her blood pressure monitored.  Would defer follow up labs to her house doctor. Needs daily BP checks. Daily  weights if possible. Her weight is 126 here today. Overall prognosis looks quite poor to me. She is a DNR according to her niece.   Patient is agreeable to this plan and will call if any problems develop in the interim.   Rosalio Macadamia, RN, ANP-[facility] [address] [place], [place]  [address]

## 2013-05-05 ENCOUNTER — Telehealth: Payer: Self-pay | Admitting: Nurse Practitioner

## 2013-05-05 NOTE — Telephone Encounter (Signed)
I have spoken to [facility]. I reiterated to her that I thought Ms. Timm was in quite poor health and looked terminal to me. I encouraged her to speak with the house doctor regarding the overall plan of care. She was quite appreciative of my time spent and my phone call back to her today.

## 2013-05-05 NOTE — Telephone Encounter (Signed)
New problem    Need to know if pt need to see an Oncologist. Please call

## 2013-05-07 ENCOUNTER — Telehealth: Payer: Self-pay | Admitting: Internal Medicine

## 2013-05-07 ENCOUNTER — Ambulatory Visit (HOSPITAL_BASED_OUTPATIENT_CLINIC_OR_DEPARTMENT_OTHER): Payer: Medicare Other

## 2013-05-07 ENCOUNTER — Ambulatory Visit: Payer: Medicare Other | Admitting: Internal Medicine

## 2013-05-07 ENCOUNTER — Ambulatory Visit (HOSPITAL_BASED_OUTPATIENT_CLINIC_OR_DEPARTMENT_OTHER): Payer: Medicare Other | Admitting: Internal Medicine

## 2013-05-07 ENCOUNTER — Ambulatory Visit: Payer: Medicare Other

## 2013-05-07 ENCOUNTER — Encounter: Payer: Self-pay | Admitting: Internal Medicine

## 2013-05-07 ENCOUNTER — Other Ambulatory Visit: Payer: Self-pay | Admitting: Internal Medicine

## 2013-05-07 VITALS — BP 133/66 | HR 77 | Temp 97.9°F | Resp 17 | Ht 67.0 in | Wt 137.5 lb

## 2013-05-07 DIAGNOSIS — D473 Essential (hemorrhagic) thrombocythemia: Secondary | ICD-10-CM

## 2013-05-07 DIAGNOSIS — I82409 Acute embolism and thrombosis of unspecified deep veins of unspecified lower extremity: Secondary | ICD-10-CM

## 2013-05-07 DIAGNOSIS — D75839 Thrombocytosis, unspecified: Secondary | ICD-10-CM

## 2013-05-07 DIAGNOSIS — D638 Anemia in other chronic diseases classified elsewhere: Secondary | ICD-10-CM

## 2013-05-07 LAB — CBC WITH DIFFERENTIAL/PLATELET
BASO%: 2.6 % — ABNORMAL HIGH (ref 0.0–2.0)
Basophils Absolute: 0.4 10*3/uL — ABNORMAL HIGH (ref 0.0–0.1)
EOS%: 1 % (ref 0.0–7.0)
HCT: 31 % — ABNORMAL LOW (ref 34.8–46.6)
HGB: 9 g/dL — ABNORMAL LOW (ref 11.6–15.9)
LYMPH%: 5.7 % — ABNORMAL LOW (ref 14.0–49.7)
MCH: 23.5 pg — ABNORMAL LOW (ref 25.1–34.0)
MCHC: 29.1 g/dL — ABNORMAL LOW (ref 31.5–36.0)
MCV: 80.9 fL (ref 79.5–101.0)
MONO%: 9.4 % (ref 0.0–14.0)
NEUT%: 81.3 % — ABNORMAL HIGH (ref 38.4–76.8)
Platelets: 2323 10*3/uL — ABNORMAL HIGH (ref 145–400)
WBC: 15.2 10*3/uL — ABNORMAL HIGH (ref 3.9–10.3)
lymph#: 0.9 10*3/uL (ref 0.9–3.3)

## 2013-05-07 LAB — COMPREHENSIVE METABOLIC PANEL
ALT: <5 U/L (ref 0–35)
AST: 22 U/L (ref 0–37)
Albumin: 3.6 g/dL (ref 3.5–5.2)
Alkaline Phosphatase: 129 U/L — ABNORMAL HIGH (ref 39–117)
BUN: 15 mg/dL (ref 6–23)
Creatinine, Ser: 0.89 mg/dL (ref 0.50–1.10)
Glucose, Bld: 100 mg/dL — ABNORMAL HIGH (ref 70–99)
Total Bilirubin: 0.6 mg/dL (ref 0.3–1.2)

## 2013-05-07 MED ORDER — HYDROXYUREA 500 MG PO CAPS: ORAL_CAPSULE | ORAL | Status: DC

## 2013-05-07 NOTE — Patient Instructions (Signed)
Platelet Count The platelet count is the number of platelets per mm in a sample of your blood. Platelets are an important part of the blood's clotting process. This test is used to evaluate patients who develop bleeding disorders. PREPARATION FOR TEST No preparation or fasting is needed.  Avoid strenuous exercise prior to this test.  For females, inform your caregiver if you are pregnant or menstruating. NORMAL FINDINGS  Adult/elderly: 150,000 to 400,000/mm or 150 to 400 x 109/L (SI units)  Premature infant: 100,000 to 300,000/ mm  Newborn: 150,000 to 300,000/ mm  Infant: 200,000 to 475,000/ mm  Child: 150,000 to 400,000/ mm Possible critical values: less than 50,000 or greater than 1 million/ mm Ranges for normal findings may vary among different laboratories and hospitals. You should always check with your caregiver after having lab work or other tests done to discuss the meaning of your test results and whether your values are considered within normal limits. MEANING OF TEST  Your caregiver will go over the test results with you. Your caregiver will discuss the importance and meaning of your results. He or she will also discuss treatment options and additional tests, if needed. OBTAINING THE TEST RESULTS It is your responsibility to obtain your test results. Ask the lab or department performing the test when and how you will get your results. Document Released: 06/29/2004 Document Revised: 08/27/2011 Document Reviewed: 05/16/2008 [employer] Patient Information 2014 [employer].  

## 2013-05-07 NOTE — Telephone Encounter (Signed)
Gave pt appt for lab and MD on December 2014 , pt sent to labs today

## 2013-05-10 NOTE — Progress Notes (Signed)
[facility] Telephone:([phone]) [phone]   [phone]  NEW PATIENT EVALUATION   Name: Consepcion Turner Date: 05/10/2013 MRN: [id number] DOB: 1921/07/16  PCP: [name], MD   REFERRING PHYSICIAN: [name], MD  REASON FOR REFERRAL: Thrombocytosis    HISTORY OF PRESENT ILLNESS:Tamara Turner is a 79 y.o. female who is here for evaluation and management of her thrombocytosis.  She lives at the [facility] and rehabilitation center here in [place], [place].  She was last seen by NP-C Wayne Both on 04/23/13 for BLE edema with prescription given for doxycycline 100 mg PO BID x 7 days.  Prior to that, she was seen on 04/09/2013 for Left arm edema and was given lasix 30 mg po daily x 2 days.  She also has a history of HTN, chronic AF, CAD with stenting and DVT for which she was a poor candidate for anticoagulation.  She is confined to a wheelchair but smokes occasionally.  Of note, review of her chart is notable for frequent falls.  She also has a history of alcoholism, depression, OA and phlebitis.  Her thrombocytosis appears to go back for several months.  She is accompanied by her niece, Tamara Turner who is also her power of attorney [phone]).  She complains of dyspnea on light exertion and is on oxygen all the time.  She has a history of decreased appetite.  Per her records, she was no longer on hydrea and was never followed by hematology.    Her labs obtain on 04/27/2013 demonstrated a CBC significant for WBC of 13.5, Hemoglobin of 9.8 and Platelets of 2030. Her creatinine was 0.8.  Her platelets on 03/31/13 was 1364.  Her platelets on 02/25/13 were 1586. Her platelets on 02/09/13 was 2197.     PAST MEDICAL HISTORY:  has a past medical history of Vascular abnormality; DVT (deep venous thrombosis); Alcoholism; Depression; Hypertension; UTI (lower urinary tract infection); Chronic back pain; Arthritis; Renal disorder; Heart disease; Hyperlipidemia;  Phlebitis; UTI (urinary tract infection); CAD (coronary artery disease); Diarrhea; Acute cholecystitis (March 2014); Cellulitis of foot (05/19/2012); and Acute osteomyelitis, lower leg (05/21/2012).     PAST SURGICAL HISTORY: Past Surgical History  Procedure Laterality Date  . Thrombectomy      R leg  . Shoulder surgery    . Hand surgery    . Appendectomy    . Coronary angioplasty with stent placement    . Spine surgery      x 3  . Exploratory laparotomy      associated with "trying to get pregnant" - low midline scar     CURRENT MEDICATIONS: has a current medication list which includes the following prescription(s): albuterol, amlodipine, aspirin, digoxin, duloxetine, furosemide, gabapentin, hydrocodone-acetaminophen, isosorbide mononitrate, lactobacillus, loperamide, memantine, mirtazapine, morphine, multiple vitamins-minerals, omeprazole, polyethylene glycol, promethazine, psyllium, tramadol, hydroxyurea, and saccharomyces boulardii.   ALLERGIES: Flagyl; Penicillins; and Sulfa antibiotics   SOCIAL HISTORY:  reports that she has been smoking Cigarettes.  She has been smoking about 0.25 packs per day. She has never used smokeless tobacco. She reports that she does not drink alcohol or use illicit drugs.   FAMILY HISTORY: family history includes Pancreatic cancer in her sister. There is no history of Colon cancer.   LABORATORY DATA:  CBC    Component Value Date/Time   WBC 15.2* 05/07/2013 1623   WBC 15.7* 02/09/2013 1540   RBC 3.83 05/07/2013 1623   RBC 4.31 02/09/2013 1540   HGB 9.0* 05/07/2013 1623   HGB 11.1*  02/09/2013 1540   HCT 31.0* 05/07/2013 1623   HCT 36.0 02/09/2013 1540   PLT 2,323* 05/07/2013 1623   PLT 2418* 02/09/2013 1540   MCV 80.9 05/07/2013 1623   MCV 83.5 02/09/2013 1540   MCH 23.5* 05/07/2013 1623   MCH 25.8* 02/09/2013 1540   MCHC 29.1* 05/07/2013 1623   MCHC 30.8 02/09/2013 1540   RDW 19.3* 05/07/2013 1623   RDW 19.5* 02/09/2013 1540   LYMPHSABS 0.9  05/07/2013 1623   LYMPHSABS 1.3 02/09/2013 1540   MONOABS 1.4* 05/07/2013 1623   MONOABS 1.6* 02/09/2013 1540   EOSABS 0.1 05/07/2013 1623   EOSABS 0.3 02/09/2013 1540   BASOSABS 0.4* 05/07/2013 1623   BASOSABS 0.2* 02/09/2013 1540    CMP     Component Value Date/Time   NA 137 05/07/2013 1623   K 3.4* 05/07/2013 1623   CL 94* 05/07/2013 1623   CO2 32 05/07/2013 1623   GLUCOSE 100* 05/07/2013 1623   BUN 15 05/07/2013 1623   CREATININE 0.89 05/07/2013 1623   CALCIUM 9.3 05/07/2013 1623   PROT 6.7 05/07/2013 1623   ALBUMIN 3.6 05/07/2013 1623   AST 22 05/07/2013 1623   ALT <5 05/07/2013 1623   ALKPHOS 129* 05/07/2013 1623   BILITOT 0.6 05/07/2013 1623   GFRNONAA 51* 02/09/2013 1540   GFRAA 59* 02/09/2013 1540    RADIOGRAPHY: No results found.     REVIEW OF SYSTEMS:  Constitutional: Denies fevers, chills or abnormal weight loss and positive decrease in appetite Eyes: Denies blurriness of vision Ears, nose, mouth, throat, and face: Denies mucositis or sore throat Respiratory: Denies cough, dyspnea or wheezes Cardiovascular: Denies palpitation, chest discomfort or lower extremity swelling Gastrointestinal:  Denies nausea, heartburn or change in bowel habits Skin: Denies abnormal skin rashes Lymphatics: Denies new lymphadenopathy or easy bruising Neurological:Denies numbness, tingling or new weaknesses Behavioral/Psych: Mood is stable, no new changes  All other systems were reviewed with the patient and are negative.  PHYSICAL EXAM:  height is 5\' 7"  (1.702 m) and weight is 137 lb 8 oz (62.37 kg). Her oral temperature is 97.9 F (36.6 C). Her blood pressure is 133/66 and her pulse is 77. Her respiration is 17 and oxygen saturation is 93%.    GENERAL:alert, no distress and comfortable; chronically ill appear.  Sitting in wheelchair.  SKIN: skin color, texture, turgor are normal, no rashes or significant lesions EYES: normal, Conjunctiva are pink and non-injected, sclera  clear OROPHARYNX:no exudate, no erythema and lips, buccal mucosa, and tongue normal  NECK: supple, thyroid normal size, non-tender, without nodularity LYMPH:  no palpable lymphadenopathy in the cervical, axillary or inguinal LUNGS: clear to auscultation and percussion with normal breathing effort HEART: regular rate & rhythm and no murmurs and 2+ lower extremity edema ABDOMEN:abdomen soft, non-tender and normal bowel sounds Musculoskeletal:no cyanosis of digits and no clubbing  NEURO: alert & oriented x 3 with fluent speech, no focal motor/sensory deficits   IMPRESSION: Tamara Turner is a 79 y.o. female with a history of thrombocytosis of unclear etiology.   PLAN: 1.  Severe thrombocytosis. --She denies mental status changes or bleeding.  We will send for JAK2.  She likely has myeloproliferative disorder like essential thrombocythemia.  We will start hydrea 500 mg every other day based on her creatinine clearance.  We will repeat labs weekly at here facility.  Goal Plts would be less than 400K.  We will hold if hemoglobin decreases below 8.  Patient was agreeable to this plan.  Her creatinine  is stable.   2. Anemia of chronic disease. -- We will check ferritin, iron and TIBC on her next visit in one month.   3. Lower extremity DVT.   --Unclear etiology but likely immobilization secondary to multiple co-morbidities.  --Continue to hold A/C given risk of falls.   4. Follow-up. --Patient instructed to follow-up in one month for CBC, CMP, iron studies.  We will check labs weekly at her facility.  All questions were answered. The patient knows to call the clinic with any problems, questions or concerns. We can certainly see the patient much sooner if necessary.  I spent 25 minutes counseling the patient face to face. The total time spent in the appointment was 45 minutes.    Sevastian Witczak, MD 05/10/2013 5:04 PM

## 2013-06-15 ENCOUNTER — Encounter (INDEPENDENT_AMBULATORY_CARE_PROVIDER_SITE_OTHER): Payer: Self-pay

## 2013-06-15 ENCOUNTER — Other Ambulatory Visit (HOSPITAL_BASED_OUTPATIENT_CLINIC_OR_DEPARTMENT_OTHER): Payer: Medicare Other

## 2013-06-15 ENCOUNTER — Telehealth: Payer: Self-pay | Admitting: Internal Medicine

## 2013-06-15 ENCOUNTER — Ambulatory Visit (HOSPITAL_BASED_OUTPATIENT_CLINIC_OR_DEPARTMENT_OTHER): Payer: Medicare Other | Admitting: Internal Medicine

## 2013-06-15 VITALS — BP 139/65 | HR 63 | Temp 97.2°F | Resp 18 | Wt 127.0 lb

## 2013-06-15 DIAGNOSIS — D473 Essential (hemorrhagic) thrombocythemia: Secondary | ICD-10-CM

## 2013-06-15 DIAGNOSIS — D649 Anemia, unspecified: Secondary | ICD-10-CM

## 2013-06-15 DIAGNOSIS — I259 Chronic ischemic heart disease, unspecified: Secondary | ICD-10-CM

## 2013-06-15 DIAGNOSIS — I82409 Acute embolism and thrombosis of unspecified deep veins of unspecified lower extremity: Secondary | ICD-10-CM

## 2013-06-15 DIAGNOSIS — D638 Anemia in other chronic diseases classified elsewhere: Secondary | ICD-10-CM

## 2013-06-15 DIAGNOSIS — I4891 Unspecified atrial fibrillation: Secondary | ICD-10-CM

## 2013-06-15 DIAGNOSIS — D75839 Thrombocytosis, unspecified: Secondary | ICD-10-CM

## 2013-06-15 LAB — COMPREHENSIVE METABOLIC PANEL (CC13)
ALT: 9 U/L (ref 0–55)
AST: 20 U/L (ref 5–34)
Anion Gap: 12 mEq/L — ABNORMAL HIGH (ref 3–11)
BUN: 15.4 mg/dL (ref 7.0–26.0)
CO2: 31 mEq/L — ABNORMAL HIGH (ref 22–29)
Creatinine: 0.9 mg/dL (ref 0.6–1.1)
Total Bilirubin: 0.9 mg/dL (ref 0.20–1.20)
Total Protein: 6.9 g/dL (ref 6.4–8.3)

## 2013-06-15 LAB — CBC WITH DIFFERENTIAL/PLATELET
BASO%: 1.2 % (ref 0.0–2.0)
EOS%: 0.7 % (ref 0.0–7.0)
HCT: 31.1 % — ABNORMAL LOW (ref 34.8–46.6)
LYMPH%: 6.7 % — ABNORMAL LOW (ref 14.0–49.7)
MCH: 25 pg — ABNORMAL LOW (ref 25.1–34.0)
MCHC: 31.8 g/dL (ref 31.5–36.0)
NEUT#: 8.9 10*3/uL — ABNORMAL HIGH (ref 1.5–6.5)
NEUT%: 81.4 % — ABNORMAL HIGH (ref 38.4–76.8)
Platelets: 1084 10*3/uL — ABNORMAL HIGH (ref 145–400)

## 2013-06-15 NOTE — Telephone Encounter (Signed)
Gave pt appt for lab and Md, for February 2015 and lab on January 2015

## 2013-06-16 NOTE — Progress Notes (Signed)
[facility] OFFICE PROGRESS NOTE  Tamara Housekeeper, MD [address], [address] [place] [address]  DIAGNOSIS: Thrombocytosis - Plan: CBC with Differential in 2 months, CBC with Differential in 1 month, Comprehensive metabolic panel (Cmet) - [facility]  Deep vein thrombosis, unspecified laterality  Chief Complaint  Patient presents with  . Thrombocytosis    CURRENT THERAPY: Hydrea on M-F and off on Sat./Sunday.  INTERVAL HISTORY: Tamara Turner 79 y.o. female with a history of thrombocytosis (JAK2 was negative) is here for follow-up.  She is accompanied by her brother Tamara Turner.  She was last seen by me  On 05/07/2013.  As previously noted, she lives at the [facility] and rehabilitation center here in [place], [place].   She was last seen by NP-C Tamara Turner on 04/23/13 for BLE edema with prescription given for doxycycline 100 mg PO BID x 7 days. Prior to that, she was seen on 04/09/2013 for Left arm edema and was given lasix 30 mg po daily x 2 days. She also has a history of HTN, chronic AF, CAD with stenting and DVT for which she was a poor candidate for anticoagulation. She is confined to a wheelchair but smokes occasionally. Of note, review of her chart is notable for frequent falls. She also has a history of alcoholism, depression, OA and phlebitis.   Her thrombocytosis appears to go back for several months. She is accompanied by her niece, Tamara Turner who is also her power of attorney [phone]). She complains of dyspnea on light exertion and is on oxygen all the time. She has a history of decreased appetite. Per her records, she was no longer on hydrea and was never followed by hematology.  Her labs obtain on 04/27/2013 demonstrated a CBC significant for WBC of 13.5, Hemoglobin of 9.8 and Platelets of 2030. Her creatinine was 0.8. Her platelets on 03/31/13 was 1364. Her platelets on 02/25/13 were 1586. Her platelets on 02/09/13 was 2197.  MEDICAL  HISTORY: Past Medical History  Diagnosis Date  . Vascular abnormality   . DVT (deep venous thrombosis)   . Alcoholism   . Depression   . Hypertension   . UTI (lower urinary tract infection)   . Chronic back pain   . Arthritis     all over  . Renal disorder   . Heart disease   . Hyperlipidemia   . Phlebitis   . UTI (urinary tract infection)   . CAD (coronary artery disease)   . Diarrhea   . Acute cholecystitis March 2014  . Cellulitis of foot 05/19/2012  . Acute osteomyelitis, lower leg 05/21/2012    INTERIM HISTORY: has Ischemic heart disease; Atrial fibrillation; Deep vein thrombosis; Thrombocytosis; Anemia; Leucopenia; Peripheral vascular disease; Dilated intrahepatic bile duct; and IBS (irritable bowel syndrome) on her problem list.    ALLERGIES:  is allergic to flagyl; penicillins; and sulfa antibiotics.  MEDICATIONS: has a current medication list which includes the following prescription(s): albuterol, alprazolam, amlodipine, aspirin, digoxin, duloxetine, furosemide, gabapentin, hydrocodone-acetaminophen, hydroxyurea, isosorbide mononitrate, memantine, mirtazapine, morphine, morphine, multiple vitamins-minerals, omeprazole, polyethylene glycol, promethazine, psyllium, tramadol, and saccharomyces boulardii.  SURGICAL HISTORY:  Past Surgical History  Procedure Laterality Date  . Thrombectomy      R leg  . Shoulder surgery    . Hand surgery    . Appendectomy    . Coronary angioplasty with stent placement    . Spine surgery      x 3  . Exploratory laparotomy  associated with "trying to get pregnant" - low midline scar    REVIEW OF SYSTEMS:   Constitutional: Denies fevers, chills or abnormal weight loss Eyes: Denies blurriness of vision Ears, nose, mouth, throat, and face: Denies mucositis or sore throat Respiratory: Denies cough, dyspnea or wheezes Cardiovascular: Denies palpitation, chest discomfort or lower extremity swelling Gastrointestinal:  Denies nausea,  heartburn or change in bowel habits Skin: Denies abnormal skin rashes Lymphatics: Denies new lymphadenopathy or easy bruising Neurological:Denies numbness, tingling or new weaknesses Behavioral/Psych: Mood is stable, no new changes  All other systems were reviewed with the patient and are negative.  PHYSICAL EXAMINATION: ECOG PERFORMANCE STATUS: 2 - Symptomatic, <50% confined to bed  Blood pressure 139/65, pulse 63, temperature 97.2 F (36.2 C), temperature source Oral, resp. rate 18, weight 127 lb (57.607 kg).  GENERAL:alert, no distress and comfortable; chronically ill appearing.  Sitting in wheelchair. SKIN: skin color, texture, turgor are normal, no rashes or significant lesions EYES: normal, Conjunctiva are pink and non-injected, sclera clear OROPHARYNX:no exudate, no erythema and lips, buccal mucosa, and tongue normal  NECK: supple, thyroid normal size, non-tender, without nodularity LYMPH:  no palpable lymphadenopathy in the cervical, axillary or supraclavicular LUNGS: clear to auscultation and percussion with normal breathing effort HEART: regular rate & rhythm and no murmurs and Trace extremity edema ABDOMEN:abdomen soft, non-tender and normal bowel sounds Musculoskeletal:no cyanosis of digits and no clubbing  NEURO: alert & oriented x 3 with fluent speech, no focal motor/sensory deficits   LABORATORY DATA: Results for orders placed in visit on 06/15/13 (from the past 48 hour(s))  CBC WITH DIFFERENTIAL     Status: Abnormal   Collection Time    06/15/13  2:45 PM      Result Value Range   WBC 10.9 (*) 3.9 - 10.3 10e3/uL   NEUT# 8.9 (*) 1.5 - 6.5 10e3/uL   HGB 9.9 (*) 11.6 - 15.9 g/dL   HCT 01.0 (*) 27.2 - 53.6 %   Platelets 1,084 (*) 145 - 400 10e3/uL   MCV 78.7 (*) 79.5 - 101.0 fL   MCH 25.0 (*) 25.1 - 34.0 pg   MCHC 31.8  31.5 - 36.0 g/dL   RBC 6.44  0.34 - 7.42 10e6/uL   RDW 18.6 (*) 11.2 - 14.5 %   lymph# 0.7 (*) 0.9 - 3.3 10e3/uL   MONO# 1.1 (*) 0.1 - 0.9  10e3/uL   Eosinophils Absolute 0.1  0.0 - 0.5 10e3/uL   Basophils Absolute 0.1  0.0 - 0.1 10e3/uL   NEUT% 81.4 (*) 38.4 - 76.8 %   LYMPH% 6.7 (*) 14.0 - 49.7 %   MONO% 10.0  0.0 - 14.0 %   EOS% 0.7  0.0 - 7.0 %   BASO% 1.2  0.0 - 2.0 %  COMPREHENSIVE METABOLIC PANEL (CC13)     Status: Abnormal   Collection Time    06/15/13  2:47 PM      Result Value Range   Sodium 139  136 - 145 mEq/L   Potassium 3.5  3.5 - 5.1 mEq/L   Chloride 96 (*) 98 - 109 mEq/L   CO2 31 (*) 22 - 29 mEq/L   Glucose 112  70 - 140 mg/dl   BUN 59.5  7.0 - 63.8 mg/dL   Creatinine 0.9  0.6 - 1.1 mg/dL   Total Bilirubin 7.56  0.20 - 1.20 mg/dL   Alkaline Phosphatase 122  40 - 150 U/L   AST 20  5 - 34 U/L   ALT  9  0 - 55 U/L   Total Protein 6.9  6.4 - 8.3 g/dL   Albumin 3.8  3.5 - 5.0 g/dL   Calcium 9.1  8.4 - 78.4 mg/dL   Anion Gap 12 (*) 3 - 11 mEq/L       Labs:  Lab Results  Component Value Date   WBC 10.9* 06/15/2013   HGB 9.9* 06/15/2013   HCT 31.1* 06/15/2013   MCV 78.7* 06/15/2013   PLT 1,084* 06/15/2013   NEUTROABS 8.9* 06/15/2013      Chemistry      Component Value Date/Time   NA 139 06/15/2013 1447   NA 137 05/07/2013 1623   K 3.5 06/15/2013 1447   K 3.4* 05/07/2013 1623   CL 94* 05/07/2013 1623   CO2 31* 06/15/2013 1447   CO2 32 05/07/2013 1623   BUN 15.4 06/15/2013 1447   BUN 15 05/07/2013 1623   CREATININE 0.9 06/15/2013 1447   CREATININE 0.89 05/07/2013 1623      Component Value Date/Time   CALCIUM 9.1 06/15/2013 1447   CALCIUM 9.3 05/07/2013 1623   ALKPHOS 122 06/15/2013 1447   ALKPHOS 129* 05/07/2013 1623   AST 20 06/15/2013 1447   AST 22 05/07/2013 1623   ALT 9 06/15/2013 1447   ALT <5 05/07/2013 1623   BILITOT 0.90 06/15/2013 1447   BILITOT 0.6 05/07/2013 1623     Basic Metabolic Panel:  Recent Labs Lab 06/15/13 1447  NA 139  K 3.5  CO2 31*  GLUCOSE 112  BUN 15.4  CREATININE 0.9  CALCIUM 9.1   GFR The CrCl is unknown because Turner a height and weight  (above a minimum accepted value) are required for this calculation. Liver Function Tests:  Recent Labs Lab 06/15/13 1447  AST 20  ALT 9  ALKPHOS 122  BILITOT 0.90  PROT 6.9  ALBUMIN 3.8   CBC:  Recent Labs Lab 06/15/13 1445  WBC 10.9*  NEUTROABS 8.9*  HGB 9.9*  HCT 31.1*  MCV 78.7*  PLT 1,084*   Studies:  No results found.   RADIOGRAPHIC STUDIES: No results found.  ASSESSMENT: Tamara Turner 79 y.o. female with a history of Thrombocytosis - Plan: CBC with Differential in 2 months, CBC with Differential in 1 month, Comprehensive metabolic panel (Cmet) - [facility]  Deep vein thrombosis, unspecified laterality   PLAN:  1. Severe thrombocytosis.  --She denies mental status changes or bleeding. JAK2 was sent and was found to be negative. She likely has myeloproliferative disorder like essential thrombocythemia. We started hydrea 500 mg every other day based on her creatinine clearance her plts dropped from 2.4 million to 1 million as noted above.   We will increase to hydrea 500 mg on Monday through fridays (off on Sat and Sunday) and repeat labs in one month.  .  Goal Plts would be less than 400K. We will hold if hemoglobin decreases below 8. Patient was agreeable to this plan. Her creatinine is stable.   2. Anemia of chronic disease.  -- We will check ferritin, iron and TIBC on her next visit in one month.   3. Lower extremity DVT.  --Unclear etiology but likely immobilization secondary to multiple co-morbidities.  --Continue to hold A/C given risk of falls.   4. Follow-up.  --Patient instructed to follow-up in two months for CBC, CMP, iron studies. We will check labs monthly at her facility.   All questions were answered. The patient knows to call the clinic with any problems, questions or  concerns. We can certainly see the patient much sooner if necessary.  I spent 10 minutes counseling the patient face to face. The total time spent in the appointment was 15  minutes.    Jackelyne Sayer, MD 06/16/2013 6:05 AM

## 2013-07-16 ENCOUNTER — Other Ambulatory Visit (HOSPITAL_BASED_OUTPATIENT_CLINIC_OR_DEPARTMENT_OTHER): Payer: Medicare Other

## 2013-07-16 ENCOUNTER — Telehealth: Payer: Self-pay | Admitting: Internal Medicine

## 2013-07-16 DIAGNOSIS — D638 Anemia in other chronic diseases classified elsewhere: Secondary | ICD-10-CM

## 2013-07-16 DIAGNOSIS — D473 Essential (hemorrhagic) thrombocythemia: Secondary | ICD-10-CM

## 2013-07-16 DIAGNOSIS — D649 Anemia, unspecified: Secondary | ICD-10-CM

## 2013-07-16 DIAGNOSIS — D75839 Thrombocytosis, unspecified: Secondary | ICD-10-CM

## 2013-07-16 LAB — CBC WITH DIFFERENTIAL/PLATELET
BASO%: 0.9 % (ref 0.0–2.0)
Basophils Absolute: 0.1 10*3/uL (ref 0.0–0.1)
EOS%: 0.6 % (ref 0.0–7.0)
Eosinophils Absolute: 0.1 10*3/uL (ref 0.0–0.5)
HCT: 29.5 % — ABNORMAL LOW (ref 34.8–46.6)
HGB: 8.9 g/dL — ABNORMAL LOW (ref 11.6–15.9)
LYMPH%: 8.4 % — ABNORMAL LOW (ref 14.0–49.7)
MCH: 23.8 pg — ABNORMAL LOW (ref 25.1–34.0)
MCHC: 30.3 g/dL — ABNORMAL LOW (ref 31.5–36.0)
MCV: 78.7 fL — ABNORMAL LOW (ref 79.5–101.0)
MONO#: 0.9 10*3/uL (ref 0.1–0.9)
MONO%: 8.3 % (ref 0.0–14.0)
NEUT#: 8.4 10*3/uL — ABNORMAL HIGH (ref 1.5–6.5)
NEUT%: 81.8 % — ABNORMAL HIGH (ref 38.4–76.8)
Platelets: 1138 10*3/uL — ABNORMAL HIGH (ref 145–400)
RBC: 3.75 10*6/uL (ref 3.70–5.45)
RDW: 20.2 % — ABNORMAL HIGH (ref 11.2–14.5)
WBC: 10.3 10*3/uL (ref 3.9–10.3)
lymph#: 0.9 10*3/uL (ref 0.9–3.3)

## 2013-07-16 LAB — IRON AND TIBC CHCC
%SAT: 4 % — ABNORMAL LOW (ref 21–57)
Iron: 19 ug/dL — ABNORMAL LOW (ref 41–142)
TIBC: 428 ug/dL (ref 236–444)
UIBC: 409 ug/dL — ABNORMAL HIGH (ref 120–384)

## 2013-07-16 LAB — FERRITIN CHCC: FERRITIN: 20 ng/mL (ref 9–269)

## 2013-07-16 NOTE — Telephone Encounter (Signed)
I called Pat who supplied me Ms. Yore telephone number at the [facility] center  [phone]).  I spoke with her nurse and instruced her to add ferrous sulfate 325 bid and increase her hydrea to 500 mg once daily from 500 mg (m-f).

## 2013-08-14 ENCOUNTER — Other Ambulatory Visit (HOSPITAL_BASED_OUTPATIENT_CLINIC_OR_DEPARTMENT_OTHER): Payer: Medicare Other

## 2013-08-14 ENCOUNTER — Telehealth: Payer: Self-pay | Admitting: Internal Medicine

## 2013-08-14 ENCOUNTER — Ambulatory Visit (HOSPITAL_BASED_OUTPATIENT_CLINIC_OR_DEPARTMENT_OTHER): Payer: Medicare Other | Admitting: Internal Medicine

## 2013-08-14 VITALS — BP 136/60 | HR 77 | Temp 98.2°F | Resp 18 | Ht 67.0 in | Wt 111.8 lb

## 2013-08-14 DIAGNOSIS — D638 Anemia in other chronic diseases classified elsewhere: Secondary | ICD-10-CM

## 2013-08-14 DIAGNOSIS — D75839 Thrombocytosis, unspecified: Secondary | ICD-10-CM

## 2013-08-14 DIAGNOSIS — I82409 Acute embolism and thrombosis of unspecified deep veins of unspecified lower extremity: Secondary | ICD-10-CM

## 2013-08-14 DIAGNOSIS — D649 Anemia, unspecified: Secondary | ICD-10-CM

## 2013-08-14 DIAGNOSIS — D473 Essential (hemorrhagic) thrombocythemia: Secondary | ICD-10-CM

## 2013-08-14 LAB — COMPREHENSIVE METABOLIC PANEL (CC13)
ALT: 10 U/L (ref 0–55)
ANION GAP: 9 meq/L (ref 3–11)
AST: 26 U/L (ref 5–34)
Albumin: 3.3 g/dL — ABNORMAL LOW (ref 3.5–5.0)
Alkaline Phosphatase: 111 U/L (ref 40–150)
BILIRUBIN TOTAL: 0.68 mg/dL (ref 0.20–1.20)
BUN: 18.1 mg/dL (ref 7.0–26.0)
CO2: 27 mEq/L (ref 22–29)
CREATININE: 0.8 mg/dL (ref 0.6–1.1)
Calcium: 8.9 mg/dL (ref 8.4–10.4)
Chloride: 102 mEq/L (ref 98–109)
Glucose: 90 mg/dl (ref 70–140)
Potassium: 4.2 mEq/L (ref 3.5–5.1)
Sodium: 139 mEq/L (ref 136–145)
Total Protein: 6.4 g/dL (ref 6.4–8.3)

## 2013-08-14 LAB — CBC WITH DIFFERENTIAL/PLATELET
BASO%: 0.5 % (ref 0.0–2.0)
BASOS ABS: 0.1 10*3/uL (ref 0.0–0.1)
EOS ABS: 0.1 10*3/uL (ref 0.0–0.5)
EOS%: 0.9 % (ref 0.0–7.0)
HEMATOCRIT: 35.6 % (ref 34.8–46.6)
HEMOGLOBIN: 10.7 g/dL — AB (ref 11.6–15.9)
LYMPH#: 0.6 10*3/uL — AB (ref 0.9–3.3)
LYMPH%: 5.7 % — ABNORMAL LOW (ref 14.0–49.7)
MCH: 25.5 pg (ref 25.1–34.0)
MCHC: 30 g/dL — ABNORMAL LOW (ref 31.5–36.0)
MCV: 85.1 fL (ref 79.5–101.0)
MONO#: 1.3 10*3/uL — ABNORMAL HIGH (ref 0.1–0.9)
MONO%: 11.8 % (ref 0.0–14.0)
NEUT#: 8.8 10*3/uL — ABNORMAL HIGH (ref 1.5–6.5)
NEUT%: 81.1 % — ABNORMAL HIGH (ref 38.4–76.8)
Platelets: 1344 10*3/uL — ABNORMAL HIGH (ref 145–400)
RBC: 4.18 10*6/uL (ref 3.70–5.45)
RDW: 29 % — ABNORMAL HIGH (ref 11.2–14.5)
WBC: 10.9 10*3/uL — ABNORMAL HIGH (ref 3.9–10.3)

## 2013-08-14 MED ORDER — ANAGRELIDE HCL 1 MG PO CAPS: 1.0000 mg | ORAL_CAPSULE | Freq: Two times a day (BID) | ORAL | Status: DC

## 2013-08-14 NOTE — Telephone Encounter (Signed)
gave pt appt for lab and md  , pt will draw labs @ [facility], script given to pt by nurse

## 2013-08-17 NOTE — Telephone Encounter (Signed)
Closed encounter °

## 2013-08-17 NOTE — Progress Notes (Signed)
[place], [employer], [address] [place] [address]  DIAGNOSIS: Thrombocytosis - Plan: furosemide (LASIX) 20 MG tablet, ferrous sulfate 325 (65 FE) MG tablet, loperamide (IMODIUM A-D) 2 MG tablet, anagrelide (AGRYLIN) 1 MG capsule, CBC with Differential, CBC with Differential  Anemia  Chief Complaint  Patient presents with  . Thrombocytosis    CURRENT THERAPY: Hydrea on M-F and off on Sat./Sunday.  INTERVAL HISTORY: Tamara Turner 80 y.o. female with a history of thrombocytosis (JAK2 was negative) is here for follow-up.  She was last seen by me on 06/15/2013.  As previously noted, she lives at the [facility] rehabilitation center here in [place], [place].   She was last seen by NP-C Hulan Fray on 04/23/13 for BLE edema with prescription given for doxycycline 100 mg PO BID x 7 days. Prior to that, she was seen on 04/09/2013 for Left arm edema and was given lasix 30 mg po daily x 2 days. She also has a history of HTN, chronic AF, CAD with stenting and DVT for which she was a poor candidate for anticoagulation. She is confined to a wheelchair but smokes occasionally. Of note, review of her chart is notable for frequent falls. She also has a history of alcoholism, depression, OA and phlebitis.   Her thrombocytosis appears to go back for several months. She is accompanied by her niece, Tamara Turner who is also her power of attorney [phone]). She complains of dyspnea on light exertion and is on oxygen all the time. She has a history of decreased appetite. Per her records, she was no longer on hydrea and was never followed by hematology.  Her labs obtain on 04/27/2013 demonstrated a CBC significant for WBC of 13.5, Hemoglobin of 9.8 and Platelets of 2030. Her creatinine was 0.8. Her platelets on 03/31/13 was 1364. Her platelets on 02/25/13 were 1586. Her platelets on 02/09/13 was 2197.  Today, she reports  worsening of her leg with multiple skin ulcerations developing.  This has caused her significant pain.   She reports compliance to her hydrea.   MEDICAL HISTORY: Past Medical History  Diagnosis Date  . Vascular abnormality   . DVT (deep venous thrombosis)   . Alcoholism   . Depression   . Hypertension   . UTI (lower urinary tract infection)   . Chronic back pain   . Arthritis     all over  . Renal disorder   . Heart disease   . Hyperlipidemia   . Phlebitis   . UTI (urinary tract infection)   . CAD (coronary artery disease)   . Diarrhea   . Acute cholecystitis March 2014  . Cellulitis of foot 05/19/2012  . Acute osteomyelitis, lower leg 05/21/2012    INTERIM HISTORY: has Ischemic heart disease; Atrial fibrillation; Deep vein thrombosis; Thrombocytosis; Anemia; Leucopenia; Peripheral vascular disease; Dilated intrahepatic bile duct; and IBS (irritable bowel syndrome) on her problem list.    ALLERGIES:  is allergic to flagyl; penicillins; and sulfa antibiotics.  MEDICATIONS: has a current medication list which includes the following prescription(s): albuterol, alprazolam, amlodipine, aspirin, digoxin, duloxetine, ferrous sulfate, furosemide, gabapentin, hydrocodone-acetaminophen, isosorbide mononitrate, loperamide, memantine, mirtazapine, morphine, morphine, multiple vitamins-minerals, omeprazole, polyethylene glycol, promethazine, tramadol, and anagrelide.  SURGICAL HISTORY:  Past Surgical History  Procedure Laterality Date  . Thrombectomy      R leg  . Shoulder surgery    . Hand surgery    . Appendectomy    .  Coronary angioplasty with stent placement    . Spine surgery      x 3  . Exploratory laparotomy      associated with "trying to get pregnant" - low midline scar    REVIEW OF SYSTEMS:   Constitutional: Denies fevers, chills or abnormal weight loss Eyes: Denies blurriness of vision Ears, nose, mouth, throat, and face: Denies mucositis or sore throat Respiratory:  Denies cough, dyspnea or wheezes Cardiovascular: Denies palpitation, chest discomfort or lower extremity swelling Gastrointestinal:  Denies nausea, heartburn or change in bowel habits Skin: Denies abnormal skin rashes Lymphatics: Denies new lymphadenopathy or easy bruising Neurological:Denies numbness, tingling or new weaknesses Behavioral/Psych: Mood is stable, no new changes  All other systems were reviewed with the patient and are negative.  PHYSICAL EXAMINATION: ECOG PERFORMANCE STATUS: 2 - Symptomatic, <50% confined to bed  Blood pressure 136/60, pulse 77, temperature 98.2 F (36.8 C), temperature source Oral, resp. rate 18, height 5\' 7"  (1.702 m), weight 111 lb 12.8 oz (50.712 kg), SpO2 97.00%.  GENERAL:alert, no distress and comfortable; chronically ill appearing.  Sitting in wheelchair. SKIN: skin color, texture, turgor are normal, no rashes; Multiple weaping ulceration less than 2x2 cm on lower extremity.  erythematous lower extremities.   EYES: normal, Conjunctiva are pink and non-injected, sclera clear OROPHARYNX:no exudate, no erythema and lips, buccal mucosa, and tongue normal  NECK: supple, thyroid normal size, non-tender, without nodularity LYMPH:  no palpable lymphadenopathy in the cervical, axillary or supraclavicular LUNGS: clear to auscultation and percussion with normal breathing effort HEART: regular rate & rhythm and no murmurs and Trace extremity edema ABDOMEN:abdomen soft, non-tender and normal bowel sounds Musculoskeletal:no cyanosis of digits and no clubbing  NEURO: alert & oriented x 3 with fluent speech, no focal motor/sensory deficits   Labs:  Lab Results  Component Value Date   WBC 10.9* 08/14/2013   HGB 10.7* 08/14/2013   HCT 35.6 08/14/2013   MCV 85.1 08/14/2013   PLT 1,344* 08/14/2013   NEUTROABS 8.8* 08/14/2013      Chemistry      Component Value Date/Time   NA 139 08/14/2013 1418   NA 137 05/07/2013 1623   K 4.2 08/14/2013 1418   K 3.4*  05/07/2013 1623   CL 94* 05/07/2013 1623   CO2 27 08/14/2013 1418   CO2 32 05/07/2013 1623   BUN 18.1 08/14/2013 1418   BUN 15 05/07/2013 1623   CREATININE 0.8 08/14/2013 1418   CREATININE 0.89 05/07/2013 1623      Component Value Date/Time   CALCIUM 8.9 08/14/2013 1418   CALCIUM 9.3 05/07/2013 1623   ALKPHOS 111 08/14/2013 1418   ALKPHOS 129* 05/07/2013 1623   AST 26 08/14/2013 1418   AST 22 05/07/2013 1623   ALT 10 08/14/2013 1418   ALT <5 05/07/2013 1623   BILITOT 0.68 08/14/2013 1418   BILITOT 0.6 05/07/2013 1623     Basic Metabolic Panel:  Recent Labs Lab 08/14/13 1418  NA 139  K 4.2  CO2 27  GLUCOSE 90  BUN 18.1  CREATININE 0.8  CALCIUM 8.9   GFR Estimated Creatinine Clearance: 38.2 ml/min (by C-G formula based on Cr of 0.8). Liver Function Tests:  Recent Labs Lab 08/14/13 1418  AST 26  ALT 10  ALKPHOS 111  BILITOT 0.68  PROT 6.4  ALBUMIN 3.3*   CBC:  Recent Labs Lab 08/14/13 1418  WBC 10.9*  NEUTROABS 8.8*  HGB 10.7*  HCT 35.6  MCV 85.1  PLT 1,344*   Iron/TIBC/Ferritin  Component Value Date/Time   IRON 19* 07/16/2013 1432   TIBC 428 07/16/2013 1432   FERRITIN 20 07/16/2013 1432   Studies:  No results found.   RADIOGRAPHIC STUDIES: No results found.  ASSESSMENT: Devri Kreher 80 y.o. female with a history of Thrombocytosis - Plan: furosemide (LASIX) 20 MG tablet, ferrous sulfate 325 (65 FE) MG tablet, loperamide (IMODIUM A-D) 2 MG tablet, anagrelide (AGRYLIN) 1 MG capsule, CBC with Differential, CBC with Differential  Anemia   PLAN:  1. Severe thrombocytosis.  --She denies mental status changes or bleeding. JAK2 was sent and was found to be negative. She likely has myeloproliferative disorder like essential thrombocythemia. We started hydrea 500 mg every other day based on her creatinine clearance her plts dropped from 2.4 million to 1 million as noted above.   We will DISCONTINUE Hydrea secondary to her intolerance with worsening skin  ulcerations.   We will start anagralide 1 mg bid as an alternative.    Goal Plts would be less than 400K. We will hold if hemoglobin decreases below 8. Patient was agreeable to this plan. Her creatinine is stable.   2. Anemia of chronic disease.  -- Mildly decreased iron indices.  Recommend starting ferrous sulfate 325 mg bid.   3. Lower extremity DVT.  --Unclear etiology but likely immobilization secondary to multiple co-morbidities.  --Continue to hold A/C given risk of falls.   4. Follow-up.  --Patient instructed to follow-up in two months for CBC, CMP, iron studies. We will check labs monthly at her facility.   All questions were answered. The patient knows to call the clinic with any problems, questions or concerns. We can certainly see the patient much sooner if necessary.  I spent 15 minutes counseling the patient face to face. The total time spent in the appointment was 25 minutes.    Naylene Foell, MD 08/17/2013 5:40 AM

## 2013-08-31 ENCOUNTER — Other Ambulatory Visit: Payer: Self-pay | Admitting: Internal Medicine

## 2013-08-31 ENCOUNTER — Ambulatory Visit
Admission: RE | Admit: 2013-08-31 | Discharge: 2013-08-31 | Disposition: A | Discharge status: Home / Self Care | Payer: Medicare Other | Source: Ambulatory Visit | Attending: Internal Medicine | Admitting: Internal Medicine

## 2013-08-31 DIAGNOSIS — W19XXXA Unspecified fall, initial encounter: Secondary | ICD-10-CM

## 2013-08-31 DIAGNOSIS — M25511 Pain in right shoulder: Secondary | ICD-10-CM

## 2013-08-31 DIAGNOSIS — M25551 Pain in right hip: Secondary | ICD-10-CM

## 2013-08-31 DIAGNOSIS — Y92129 Unspecified place in nursing home as the place of occurrence of the external cause: Principal | ICD-10-CM

## 2013-08-31 DIAGNOSIS — G44009 Cluster headache syndrome, unspecified, not intractable: Secondary | ICD-10-CM

## 2013-09-04 ENCOUNTER — Ambulatory Visit: Payer: Medicare Other

## 2013-09-16 ENCOUNTER — Inpatient Hospital Stay (HOSPITAL_COMMUNITY)
Admission: EM | Admit: 2013-09-16 | Discharge: 2013-10-16 | DRG: 193 | Disposition: E | Payer: Medicare Other | Source: Skilled Nursing Facility | Attending: Internal Medicine | Admitting: Internal Medicine

## 2013-09-16 ENCOUNTER — Encounter (HOSPITAL_COMMUNITY): Payer: Self-pay | Admitting: Emergency Medicine

## 2013-09-16 ENCOUNTER — Emergency Department (HOSPITAL_COMMUNITY): Payer: Medicare Other

## 2013-09-16 DIAGNOSIS — K5900 Constipation, unspecified: Secondary | ICD-10-CM | POA: Diagnosis present

## 2013-09-16 DIAGNOSIS — Z515 Encounter for palliative care: Secondary | ICD-10-CM

## 2013-09-16 DIAGNOSIS — R531 Weakness: Secondary | ICD-10-CM

## 2013-09-16 DIAGNOSIS — E43 Unspecified severe protein-calorie malnutrition: Secondary | ICD-10-CM | POA: Diagnosis present

## 2013-09-16 DIAGNOSIS — Z9861 Coronary angioplasty status: Secondary | ICD-10-CM

## 2013-09-16 DIAGNOSIS — E785 Hyperlipidemia, unspecified: Secondary | ICD-10-CM | POA: Diagnosis present

## 2013-09-16 DIAGNOSIS — D473 Essential (hemorrhagic) thrombocythemia: Secondary | ICD-10-CM | POA: Diagnosis present

## 2013-09-16 DIAGNOSIS — F1021 Alcohol dependence, in remission: Secondary | ICD-10-CM | POA: Diagnosis present

## 2013-09-16 DIAGNOSIS — N179 Acute kidney failure, unspecified: Secondary | ICD-10-CM

## 2013-09-16 DIAGNOSIS — R451 Restlessness and agitation: Secondary | ICD-10-CM

## 2013-09-16 DIAGNOSIS — F329 Major depressive disorder, single episode, unspecified: Secondary | ICD-10-CM | POA: Diagnosis present

## 2013-09-16 DIAGNOSIS — R404 Transient alteration of awareness: Secondary | ICD-10-CM | POA: Diagnosis present

## 2013-09-16 DIAGNOSIS — F3289 Other specified depressive episodes: Secondary | ICD-10-CM | POA: Diagnosis present

## 2013-09-16 DIAGNOSIS — D649 Anemia, unspecified: Secondary | ICD-10-CM | POA: Diagnosis present

## 2013-09-16 DIAGNOSIS — G9340 Encephalopathy, unspecified: Secondary | ICD-10-CM

## 2013-09-16 DIAGNOSIS — F17200 Nicotine dependence, unspecified, uncomplicated: Secondary | ICD-10-CM | POA: Diagnosis present

## 2013-09-16 DIAGNOSIS — Z79899 Other long term (current) drug therapy: Secondary | ICD-10-CM

## 2013-09-16 DIAGNOSIS — R627 Adult failure to thrive: Secondary | ICD-10-CM | POA: Diagnosis present

## 2013-09-16 DIAGNOSIS — I10 Essential (primary) hypertension: Secondary | ICD-10-CM | POA: Diagnosis present

## 2013-09-16 DIAGNOSIS — J4489 Other specified chronic obstructive pulmonary disease: Secondary | ICD-10-CM | POA: Diagnosis present

## 2013-09-16 DIAGNOSIS — Z800 Family history of malignant neoplasm of digestive organs: Secondary | ICD-10-CM

## 2013-09-16 DIAGNOSIS — I2510 Atherosclerotic heart disease of native coronary artery without angina pectoris: Secondary | ICD-10-CM | POA: Diagnosis present

## 2013-09-16 DIAGNOSIS — Z66 Do not resuscitate: Secondary | ICD-10-CM | POA: Diagnosis present

## 2013-09-16 DIAGNOSIS — E46 Unspecified protein-calorie malnutrition: Secondary | ICD-10-CM

## 2013-09-16 DIAGNOSIS — J189 Pneumonia, unspecified organism: Secondary | ICD-10-CM

## 2013-09-16 DIAGNOSIS — J9691 Respiratory failure, unspecified with hypoxia: Secondary | ICD-10-CM | POA: Diagnosis present

## 2013-09-16 DIAGNOSIS — IMO0002 Reserved for concepts with insufficient information to code with codable children: Secondary | ICD-10-CM

## 2013-09-16 DIAGNOSIS — I4891 Unspecified atrial fibrillation: Secondary | ICD-10-CM | POA: Diagnosis present

## 2013-09-16 DIAGNOSIS — M129 Arthropathy, unspecified: Secondary | ICD-10-CM | POA: Diagnosis present

## 2013-09-16 DIAGNOSIS — Z681 Body mass index (BMI) 19 or less, adult: Secondary | ICD-10-CM

## 2013-09-16 DIAGNOSIS — R0600 Dyspnea, unspecified: Secondary | ICD-10-CM

## 2013-09-16 DIAGNOSIS — D75839 Thrombocytosis, unspecified: Secondary | ICD-10-CM

## 2013-09-16 DIAGNOSIS — R0989 Other specified symptoms and signs involving the circulatory and respiratory systems: Secondary | ICD-10-CM | POA: Diagnosis present

## 2013-09-16 DIAGNOSIS — R5381 Other malaise: Secondary | ICD-10-CM | POA: Diagnosis present

## 2013-09-16 DIAGNOSIS — K117 Disturbances of salivary secretion: Secondary | ICD-10-CM

## 2013-09-16 DIAGNOSIS — J449 Chronic obstructive pulmonary disease, unspecified: Secondary | ICD-10-CM | POA: Diagnosis present

## 2013-09-16 DIAGNOSIS — J9600 Acute respiratory failure, unspecified whether with hypoxia or hypercapnia: Secondary | ICD-10-CM | POA: Diagnosis present

## 2013-09-16 DIAGNOSIS — F0390 Unspecified dementia without behavioral disturbance: Secondary | ICD-10-CM | POA: Diagnosis present

## 2013-09-16 DIAGNOSIS — I259 Chronic ischemic heart disease, unspecified: Secondary | ICD-10-CM | POA: Diagnosis present

## 2013-09-16 DIAGNOSIS — Z7982 Long term (current) use of aspirin: Secondary | ICD-10-CM

## 2013-09-16 DIAGNOSIS — R4182 Altered mental status, unspecified: Secondary | ICD-10-CM

## 2013-09-16 DIAGNOSIS — Z880 Allergy status to penicillin: Secondary | ICD-10-CM

## 2013-09-16 DIAGNOSIS — R062 Wheezing: Secondary | ICD-10-CM | POA: Diagnosis present

## 2013-09-16 DIAGNOSIS — E875 Hyperkalemia: Secondary | ICD-10-CM | POA: Diagnosis present

## 2013-09-16 DIAGNOSIS — G8929 Other chronic pain: Secondary | ICD-10-CM | POA: Diagnosis present

## 2013-09-16 DIAGNOSIS — R5383 Other fatigue: Secondary | ICD-10-CM

## 2013-09-16 DIAGNOSIS — E860 Dehydration: Secondary | ICD-10-CM | POA: Diagnosis present

## 2013-09-16 HISTORY — DX: Essential (hemorrhagic) thrombocythemia: D47.3

## 2013-09-16 HISTORY — DX: Unspecified atrial fibrillation: I48.91

## 2013-09-16 HISTORY — DX: Irritable bowel syndrome without diarrhea: K58.9

## 2013-09-16 HISTORY — DX: Chronic ischemic heart disease, unspecified: I25.9

## 2013-09-16 HISTORY — DX: Other specified diseases of biliary tract: K83.8

## 2013-09-16 HISTORY — DX: Unspecified dementia, unspecified severity, without behavioral disturbance, psychotic disturbance, mood disturbance, and anxiety: F03.90

## 2013-09-16 LAB — COMPREHENSIVE METABOLIC PANEL
ALT: 15 U/L (ref 0–35)
AST: 41 U/L — AB (ref 0–37)
Albumin: 3.5 g/dL (ref 3.5–5.2)
Alkaline Phosphatase: 164 U/L — ABNORMAL HIGH (ref 39–117)
BILIRUBIN TOTAL: 1 mg/dL (ref 0.3–1.2)
BUN: 39 mg/dL — ABNORMAL HIGH (ref 6–23)
CHLORIDE: 100 meq/L (ref 96–112)
CO2: 23 mEq/L (ref 19–32)
CREATININE: 1.12 mg/dL — AB (ref 0.50–1.10)
Calcium: 8.8 mg/dL (ref 8.4–10.5)
GFR calc Af Amer: 49 mL/min — ABNORMAL LOW (ref >90)
GFR, EST NON AFRICAN AMERICAN: 42 mL/min — AB (ref >90)
Glucose, Bld: 105 mg/dL — ABNORMAL HIGH (ref 70–99)
Potassium: 5.4 mEq/L — ABNORMAL HIGH (ref 3.7–5.3)
Sodium: 135 mEq/L — ABNORMAL LOW (ref 137–147)
Total Protein: 6.8 g/dL (ref 6.0–8.3)

## 2013-09-16 LAB — LIPASE, BLOOD: Lipase: 23 U/L (ref 11–59)

## 2013-09-16 LAB — BLOOD GAS, ARTERIAL
ACID-BASE DEFICIT: 0.9 mmol/L (ref 0.0–2.0)
BICARBONATE: 24.1 meq/L — AB (ref 20.0–24.0)
Drawn by: 11249
O2 Content: 2 L/min
O2 Saturation: 91.1 %
PATIENT TEMPERATURE: 98.6
TCO2: 22.7 mmol/L (ref 0–100)
pCO2 arterial: 44.6 mmHg (ref 35.0–45.0)
pH, Arterial: 7.353 (ref 7.350–7.450)
pO2, Arterial: 65.5 mmHg — ABNORMAL LOW (ref 80.0–100.0)

## 2013-09-16 LAB — CBC
HEMATOCRIT: 32.9 % — AB (ref 36.0–46.0)
Hemoglobin: 9.8 g/dL — ABNORMAL LOW (ref 12.0–15.0)
MCH: 26.8 pg (ref 26.0–34.0)
MCHC: 29.8 g/dL — ABNORMAL LOW (ref 30.0–36.0)
MCV: 90.1 fL (ref 78.0–100.0)
PLATELETS: 1232 10*3/uL — AB (ref 150–400)
RBC: 3.65 MIL/uL — ABNORMAL LOW (ref 3.87–5.11)
RDW: 26.3 % — ABNORMAL HIGH (ref 11.5–15.5)
WBC: 19.7 10*3/uL — AB (ref 4.0–10.5)

## 2013-09-16 LAB — URINALYSIS, ROUTINE W REFLEX MICROSCOPIC
Bilirubin Urine: NEGATIVE
Glucose, UA: NEGATIVE mg/dL
HGB URINE DIPSTICK: NEGATIVE
Ketones, ur: NEGATIVE mg/dL
Leukocytes, UA: NEGATIVE
Nitrite: NEGATIVE
PH: 5.5 (ref 5.0–8.0)
Protein, ur: 30 mg/dL — AB
SPECIFIC GRAVITY, URINE: 1.022 (ref 1.005–1.030)
Urobilinogen, UA: 1 mg/dL (ref 0.0–1.0)

## 2013-09-16 LAB — URINE MICROSCOPIC-ADD ON

## 2013-09-16 LAB — PRO B NATRIURETIC PEPTIDE: PRO B NATRI PEPTIDE: 23766 pg/mL — AB (ref 0–450)

## 2013-09-16 LAB — DIGOXIN LEVEL: DIGOXIN LVL: 1.2 ng/mL (ref 0.8–2.0)

## 2013-09-16 MED ORDER — ONDANSETRON HCL 4 MG PO TABS: 4.0000 mg | ORAL_TABLET | Freq: Four times a day (QID) | ORAL | Status: DC | PRN

## 2013-09-16 MED ORDER — IPRATROPIUM BROMIDE 0.02 % IN SOLN
0.5000 mg | Freq: Once | RESPIRATORY_TRACT | Status: AC
  Administered 2013-09-16: 0.5 mg via RESPIRATORY_TRACT
  Filled 2013-09-16: qty 2.5

## 2013-09-16 MED ORDER — ALBUTEROL SULFATE (2.5 MG/3ML) 0.083% IN NEBU
5.0000 mg | INHALATION_SOLUTION | Freq: Once | RESPIRATORY_TRACT | Status: AC
  Administered 2013-09-16: 5 mg via RESPIRATORY_TRACT
  Filled 2013-09-16: qty 6

## 2013-09-16 MED ORDER — SODIUM CHLORIDE 0.9 % IV SOLN: INTRAVENOUS | Status: DC

## 2013-09-16 MED ORDER — ALBUTEROL SULFATE (2.5 MG/3ML) 0.083% IN NEBU
2.5000 mg | INHALATION_SOLUTION | RESPIRATORY_TRACT | Status: DC | PRN
Start: 2013-09-16 — End: 2013-09-18

## 2013-09-16 MED ORDER — VANCOMYCIN HCL 500 MG IV SOLR
500.0000 mg | INTRAVENOUS | Status: DC
  Filled 2013-09-16: qty 500

## 2013-09-16 MED ORDER — ALBUTEROL SULFATE (2.5 MG/3ML) 0.083% IN NEBU
2.5000 mg | INHALATION_SOLUTION | Freq: Four times a day (QID) | RESPIRATORY_TRACT | Status: DC
  Administered 2013-09-17 – 2013-09-18 (×7): 2.5 mg via RESPIRATORY_TRACT
  Filled 2013-09-16 (×9): qty 3

## 2013-09-16 MED ORDER — FUROSEMIDE 10 MG/ML IJ SOLN
20.0000 mg | Freq: Once | INTRAMUSCULAR | Status: AC
  Administered 2013-09-17: 20 mg via INTRAVENOUS
  Filled 2013-09-16: qty 2

## 2013-09-16 MED ORDER — DEXTROSE 5 % IV SOLN
2.0000 g | Freq: Once | INTRAVENOUS | Status: AC
  Administered 2013-09-16: 2 g via INTRAVENOUS

## 2013-09-16 MED ORDER — HEPARIN SODIUM (PORCINE) 5000 UNIT/ML IJ SOLN
5000.0000 [IU] | Freq: Three times a day (TID) | INTRAMUSCULAR | Status: DC
  Administered 2013-09-17 (×2): 5000 [IU] via SUBCUTANEOUS
  Filled 2013-09-16 (×6): qty 1

## 2013-09-16 MED ORDER — DEXTROSE 5 % IV SOLN
1.0000 g | Freq: Three times a day (TID) | INTRAVENOUS | Status: DC
  Administered 2013-09-17: 1 g via INTRAVENOUS
  Filled 2013-09-16 (×3): qty 1

## 2013-09-16 MED ORDER — MORPHINE SULFATE 2 MG/ML IJ SOLN: 2.0000 mg | INTRAMUSCULAR | Status: DC | PRN

## 2013-09-16 MED ORDER — ALBUTEROL SULFATE (2.5 MG/3ML) 0.083% IN NEBU: 5.0000 mg | INHALATION_SOLUTION | RESPIRATORY_TRACT | Status: DC | PRN

## 2013-09-16 MED ORDER — VANCOMYCIN HCL IN DEXTROSE 1-5 GM/200ML-% IV SOLN
1000.0000 mg | Freq: Once | INTRAVENOUS | Status: AC
  Administered 2013-09-16: 1000 mg via INTRAVENOUS
  Filled 2013-09-16: qty 200

## 2013-09-16 MED ORDER — ONDANSETRON HCL 4 MG/2ML IJ SOLN: 4.0000 mg | Freq: Four times a day (QID) | INTRAMUSCULAR | Status: DC | PRN

## 2013-09-16 MED ORDER — SODIUM CHLORIDE 0.9 % IV SOLN
INTRAVENOUS | Status: DC
  Administered 2013-09-17: 1000 mL via INTRAVENOUS

## 2013-09-16 NOTE — Progress Notes (Signed)
Clinical Social Work Department BRIEF PSYCHOSOCIAL ASSESSMENT 09/20/2013  Patient:  Tamara Turner, Tamara Turner     Account Number:  [id number]     Admit date:  10/03/2013  Clinical Social Worker:  Luretha Rued  Date/Time:  09/24/2013 09:00 PM  Referred by:  CSW  Date Referred:  09/21/2013  Other Referral:   Interview type:  Family Other interview type:   Patient was unable to answer questions appropriately.  CSW was able to communicate with family.    PSYCHOSOCIAL DATA Living Status:  FACILITY Admitted from facility:  [facility] Level of care:  Assisted Living Primary support name:  [name] Primary support relationship to patient:  FAMILY Degree of support available:   High level of support as evidenced by family at bedside.    CURRENT CONCERNS  Other Concerns:    SOCIAL WORK ASSESSMENT / PLAN CSW attempted to engage the patient with limited success as she mumbled and was not able to clearly communicate.  CSW met with family at bedside to complete this assessment. Pat Hatley(niece/POA) and her husband Mikki Santee were at the bedside.  They report that the patient was with any complication on yesterday so the fast decline in her health has confused them.  At the this the family reports it is the intent for the patient to return to [facility] once medically cleared.  Pat encouraged to call her for any concerns at [phone].   Assessment/plan status:   Other assessment/ plan:   Information/referral to community resources:   Family plan for her to return to current ALF    PATIENT'S/FAMILY'S RESPONSE TO PLAN OF CARE: Family expressed their appreciation for the support of the social work department.       Chesley Noon, MSW, [place], 09/26/2013 Evening Clinical Social Worker [phone]

## 2013-09-16 NOTE — ED Notes (Signed)
Bed: BT66 Expected date:  Expected time:  Means of arrival:  Comments: ems- elderly, altered?

## 2013-09-16 NOTE — ED Notes (Signed)
Pt from [facility], EMS called out for altered LOC. Pt has wheezing and congestion throughout lung fields. Pt alert but rambling with words. Pt having see saw breathing.

## 2013-09-16 NOTE — H&P (Addendum)
[facility] History and Physical  Tamara Turner HEN:[id number] DOB: [birth year] DOA: 09/23/2013  Referring physician: ED physician, Dr. Ashok Cordia PCP: Wenda Low, MD   Chief Complaint: Altered mental status  HPI: Tamara Turner is a 80 y.o. female with a history of atrial fibrillation, ischemic heart disease, dementia, and thrombocytosis who presents from [facility] with a report of altered mental status. The history is being taken from the ED notes and her niece and health care power of attorney, [name]. Accordingly, today, the patient was more confused and not as responsive. The nursing staff had more difficulty trying to get her out of bed to the chair. The patient was so weak that she was unable to hold her cup. They noticed that she had chest congestion and some wheezing. There was no report of subjective fever or chills, nausea, vomiting, or chest pain. The patient is currently obtunded and is unable to provide any history.  In the ED, she is afebrile and hemodynamically stable. Her oxygen saturations fell to the 80s on 3 L of nasal cannula oxygen which prompted a non-rebreather mask. Her lab data are significant for WBC of 19.7, hemoglobin of 9.8, platelet count of 1232, potassium of 5.4, and BUN of 39. Acute abdominal series reveals patchy right lower lobe airspace disease concerning for pneumonia; nodular opacity in the right upper long which may represent a focus of infection; large colonic stool burden; and extensive arthrosclerotic vascular disease. Her ABG on 2 L of oxygen revealed a pH of 7.3, PCO2 of 45, PCO2 of 66. She is being admitted for further evaluation and management.     Review of Systems:  The patient is unable to provide accurately as the patient is obtunded. According to her niece, the patient does have chronic confusion, arthritic pain, and generalized weakness.  Past Medical History  Diagnosis Date  . Vascular abnormality   . DVT  (deep venous thrombosis)   . Alcoholism   . Depression   . Hypertension   . UTI (lower urinary tract infection)   . Chronic back pain   . Arthritis     all over  . Renal disorder   . Heart disease   . Hyperlipidemia   . Phlebitis   . UTI (urinary tract infection)   . CAD (coronary artery disease)   . Diarrhea   . Acute cholecystitis March 2014  . Cellulitis of foot 05/19/2012  . Acute osteomyelitis, lower leg 05/21/2012  . Ischemic heart disease 04/01/2012  . Atrial fibrillation 04/01/2012  . Thrombocytosis 04/08/2012    PLT count was 1172 02/2011 outside records On hydrea   . IBS (irritable bowel syndrome) 03/31/2013    Long history known from chart review of outside records when she lives in [place]   . Dilated intrahepatic bile duct 08/20/2012    Chronic issues known from imaging in [place] and here not thought to be clinically significant    Past Surgical History  Procedure Laterality Date  . Thrombectomy      R leg  . Shoulder surgery    . Hand surgery    . Appendectomy    . Coronary angioplasty with stent placement    . Spine surgery      x 3  . Exploratory laparotomy      associated with "trying to get pregnant" - low midline scar   Social History:  reports that she has been smoking Cigarettes.  She has been smoking about 0.25 packs per day. She has never used  smokeless tobacco. She reports that she does not drink alcohol or use illicit drugs. She is a resident of [facility] facility. Her niece, Mrs. Ardine Bjork is her healthcare power of attorney. The patient is a DO NOT RESUSCITATE status.  Allergies  Allergen Reactions  . Flagyl [Metronidazole] Nausea Only  . Penicillins Hives  . Sulfa Antibiotics Nausea Only    Family History  Problem Relation Age of Onset  . Colon cancer Neg Hx   . Pancreatic cancer Sister      Prior to Admission medications   Medication Sig Start Date End Date Taking? Authorizing Provider  albuterol (PROVENTIL) (2.5 MG/3ML)  0.083% nebulizer solution Take 2.5 mg by nebulization every 4 (four) hours as needed for wheezing.   Yes Historical Provider, MD  ALPRAZolam (XANAX) 0.25 MG tablet Take 0.25 mg by mouth 2 (two) times daily. Take one capsule Mon,Tues,Wed,Thurs, and Fri.( 5 days weekly)   Yes Historical Provider, MD  amLODipine (NORVASC) 5 MG tablet Take 5 mg by mouth daily.   Yes Historical Provider, MD  anagrelide (AGRYLIN) 1 MG capsule Take 1 mg by mouth 2 (two) times daily. 08/14/13  Yes Concha Norway, MD  aspirin 81 MG chewable tablet Chew 81 mg by mouth daily with breakfast.   Yes Historical Provider, MD  diclofenac sodium (VOLTAREN) 1 % GEL Apply topically 4 (four) times daily.   Yes Historical Provider, MD  digoxin (LANOXIN) 0.125 MG tablet Take 0.125 mg by mouth daily with breakfast.    Yes Historical Provider, MD  DULoxetine (CYMBALTA) 60 MG capsule Take 60 mg by mouth daily.   Yes Historical Provider, MD  ferrous sulfate 325 (65 FE) MG tablet Take 325 mg by mouth 2 (two) times daily with a meal.   Yes Historical Provider, MD  gabapentin (NEURONTIN) 300 MG capsule Take 300 mg by mouth 2 (two) times daily.    Yes Historical Provider, MD  isosorbide mononitrate (IMDUR) 60 MG 24 hr tablet Take 60 mg by mouth daily with breakfast.    Yes Historical Provider, MD  loperamide (IMODIUM A-D) 2 MG tablet Take 2 mg by mouth 4 (four) times daily as needed for diarrhea or loose stools.   Yes Historical Provider, MD  memantine (NAMENDA) 5 MG tablet Take 5 mg by mouth 2 (two) times daily.   Yes Historical Provider, MD  mirtazapine (REMERON) 7.5 MG tablet Take 7.5 mg by mouth at bedtime.   Yes Historical Provider, MD  morphine (MS CONTIN) 15 MG 12 hr tablet Take 15 mg by mouth 3 (three) times daily. Take one tablet with a 30 mg tablet = 45 mg three times a day.   Yes Historical Provider, MD  morphine (MS CONTIN) 30 MG 12 hr tablet Take 30 mg by mouth 3 (three) times daily. Take one 30 mg tablet with a 15 mg tablet= 45 mg three  times a day   Yes Historical Provider, MD  Multiple Vitamins-Minerals (PRESERVISION AREDS PO) Take 1 capsule by mouth 2 (two) times daily.   Yes Historical Provider, MD  omeprazole (PRILOSEC) 40 MG capsule Take 1 capsule (40 mg total) by mouth daily. 02/25/13  Yes Jessica D. Zehr, PA-C  polyethylene glycol (MIRALAX / GLYCOLAX) packet Take 17 g by mouth daily as needed (constipation).    Yes Historical Provider, MD  potassium chloride (K-DUR) 10 MEQ tablet Take 10 mEq by mouth daily.   Yes Historical Provider, MD  promethazine (PHENERGAN) 25 MG tablet Take 25 mg by mouth every 6 (six) hours  as needed for nausea or vomiting.   Yes Historical Provider, MD  psyllium (HYDROCIL/METAMUCIL) 95 % PACK Take 1 packet by mouth daily as needed for mild constipation.   Yes Historical Provider, MD  traMADol (ULTRAM) 50 MG tablet Take 50 mg by mouth 2 (two) times daily as needed for pain.   Yes Historical Provider, MD  HYDROcodone-acetaminophen (NORCO/VICODIN) 5-325 MG per tablet Take 1 tablet by mouth every 4 (four) hours as needed for pain. Pain 08/28/12   Monika Salk, MD   Physical Exam: Filed Vitals:   09/29/2013 2329  BP: 151/68  Pulse:   Temp:   Resp:     BP 151/68  Pulse 88  Temp(Src) 97.5 F (36.4 C) (Rectal)  Resp 19  Ht 5\' 4"  (1.626 m)  Wt 47.628 kg (105 lb)  BMI 18.01 kg/m2  SpO2 94%  General: Elderly 80 year old Caucasian woman who is obtunded and appears ill. Eyes: PERRL, normal lids, irises & conjunctiva ENT: Oropharynx reveals mildly dry mucous membranes. No obvious exudates or erythema. Neck: no LAD, masses or thyromegaly Cardiovascular: Irregular, irregular ; trace of pedal edema Telemetry: Atrial fibrillation  Respiratory: Breathing mildly labored. Right greater than left mid to upper lobe crackles; no significant wheezes Abdomen: Positive bowel sounds, protuberant, soft, palpation of stool noted; no rigidity; mild distention. Skin: Large ecchymotic lesion on the right hip; a few  ecchymotic lesions and excoriations on the right greater than left legs. Musculoskeletal: Arthritic hypertrophic changes seen in her knees and feet without acute hot red joints. Psychiatric: She is lethargic and intermittently obtunded. She opens her eyes with provocation but then closes them. Neurologic: Difficult exam as the patient is nearly obtunded. She does open her eyes to voice and withdrawals her upper extremities a little with provocation. Cranial nerves appear to be grossly intact, but difficult to assess.           Labs on Admission:  Basic Metabolic Panel:  Recent Labs Lab 09/23/2013 1950  NA 135*  K 5.4*  CL 100  CO2 23  GLUCOSE 105*  BUN 39*  CREATININE 1.12*  CALCIUM 8.8   Liver Function Tests:  Recent Labs Lab 10/08/2013 1950  AST 41*  ALT 15  ALKPHOS 164*  BILITOT 1.0  PROT 6.8  ALBUMIN 3.5    Recent Labs Lab 09/17/2013 1950  LIPASE 23   No results found for this basename: AMMONIA,  in the last 168 hours CBC:  Recent Labs Lab 10/13/2013 1950  WBC 19.7*  HGB 9.8*  HCT 32.9*  MCV 90.1  PLT 1232*   Cardiac Enzymes: No results found for this basename: CKTOTAL, CKMB, CKMBINDEX, TROPONINI,  in the last 168 hours  BNP (last 3 results)  Recent Labs  09/26/2013 1950  PROBNP 23766.0*   CBG: No results found for this basename: GLUCAP,  in the last 168 hours  Radiological Exams on Admission: Dg Abd Acute W/chest  09/29/2013   CLINICAL DATA:  Abdominal pain  EXAM: ACUTE ABDOMEN SERIES (ABDOMEN 2 VIEW & CHEST 1 VIEW)  COMPARISON:  Prior CT abdomen/ pelvis 01/24/2013 ; prior acute abdominal series 8 02/2013  FINDINGS: Increased right lower lobe patchy airspace opacity. Stable cardiomegaly. Atherosclerotic calcifications again noted in the transverse aorta. Left common carotid stent. Nodular opacity in the right upper lung overlying the anterior aspect of the right second rib. Degenerative change in the left greater than right shoulder joints. Background  bronchitic changes and underlying COPD.  No free air on the lateral decubitus view.  Nonobstructed bowel gas pattern. Large volume of formed stool suggests constipation. Right iliac arterial stent. Extensive atherosclerotic vascular calcifications. Probable SMA stent.  IMPRESSION: 1. Patchy right lower lobe airspace disease concerning for pneumonia. 2. Somewhat nodular opacity in the right upper lung may represent an additional focus of infection/inflammation. However, pulmonary nodule is not excluded. Recommend follow-up chest x-ray in 4 weeks following appropriate course of therapy. If the abnormality persists, CT scan may become warranted. 3. Large colonic stool burden suggests constipation. 4. No evidence of obstruction or free air. 5. Extensive atherosclerotic vascular disease without evidence of left common carotid, SMA and right iliac arterial stents.   Electronically Signed   By: Jacqulynn Cadet M.D.   On: 10/05/2013 19:42    EKG: Independently reviewed. Atrial fibrillation, intraventricular delay, heart rate 87 beats per minute.  Assessment/Plan Principal Problem:   HCAP (healthcare-associated pneumonia) Active Problems:   Respiratory failure with hypoxia   Encephalopathy acute   Ischemic heart disease   Atrial fibrillation   Thrombocytosis   Anemia   Hyperkalemia   COPD (chronic obstructive pulmonary disease)   Constipation   1. This is an 80 year old woman with multiple medical problems who presents with acute encephalopathy, likely multifactorial, but mostly secondary to healthcare acquired pneumonia and respiratory failure with hypoxia, but decreased clearance of MS Contin is a possibility. She has mildly labored breathing and appears acutely ill. Her prognosis is poor and she may not survive this hospitalization. Her chest x-ray is not indicative of congestive heart failure, but her pro BNP is almost 24,000. Her last echocardiogram recorded in the system in 2013 revealed a normal  ejection fraction. She appears a little dry. She also has atrial fibrillation and ischemic heart disease. Her heart rate is controlled and she is hemodynamically stable. However, she has severe thrombocytosis which is not new. According to Oncology in February 2014, the patient may have some type of myeloproliferative disorder and is treated with anagrelide.    Plan: 1. I discussed the patient's poor prognosis with her niece, Mrs. Hatlley. She confirmed the patient's DO NOT RESUSCITATE status. She does not want aggressive treatment, but is in agreement with appropriate treatment of her pneumonia. 2. The patient received aztreonam and vancomycin in the emergency department. These 2 antibiotics will be continued. 3. Titrate oxygen to keep her oxygen saturations between 89 and 95%. 4. Albuterol nebulizer every 6 hours. 5. Start gentle IV fluids, but will give 1 dose of Lasix 20 mg IV for treatment of hyperkalemia, query CHF. 6. The patient will be made n.p.o. for obvious reasons. Discontinue MS Contin.  Will give digoxin IV every other day. 7. We'll order when necessary soapsuds enema for constipation. 8. Consider 2-D echocardiogram if the patient survives the next 24-48 hours of the hospitalization, but will not order it now as the patient's primary diagnosis is of healthcare acquired pneumonia.   Code Status: DO NOT RESUSCITATE Family Communication: Discussed with niece and power of attorney, [name] Disposition Plan: To be determined  Time spent: One hour and 15 minutes  [facility] Pager [phone]

## 2013-09-16 NOTE — Progress Notes (Signed)
Called ED twice to get report and was put on hold for 5+ minutes both times.

## 2013-09-16 NOTE — Progress Notes (Addendum)
ANTIBIOTIC CONSULT NOTE - INITIAL  Pharmacy Consult for Vancomycin/aztreonam Indication: PNA  Allergies  Allergen Reactions  . Flagyl [Metronidazole] Nausea Only  . Penicillins Hives  . Sulfa Antibiotics Nausea Only    Patient Measurements:   Adjusted Body Weight:   Vital Signs: BP: 154/64 mmHg (04/01 1901) Pulse Rate: 69 (04/01 1901) Intake/Output from previous day:   Intake/Output from this shift:    Labs:  Recent Labs  Oct 16, 2013 1950  CREATININE 1.12*   The CrCl is unknown because both a height and weight (above a minimum accepted value) are required for this calculation. No results found for this basename: VANCOTROUGH, VANCOPEAK, VANCORANDOM, GENTTROUGH, GENTPEAK, GENTRANDOM, TOBRATROUGH, TOBRAPEAK, TOBRARND, AMIKACINPEAK, AMIKACINTROU, AMIKACIN,  in the last 72 hours   Microbiology: No results found for this or any previous visit (from the past 720 hour(s)).  Medical History: Past Medical History  Diagnosis Date  . Vascular abnormality   . DVT (deep venous thrombosis)   . Alcoholism   . Depression   . Hypertension   . UTI (lower urinary tract infection)   . Chronic back pain   . Arthritis     all over  . Renal disorder   . Heart disease   . Hyperlipidemia   . Phlebitis   . UTI (urinary tract infection)   . CAD (coronary artery disease)   . Diarrhea   . Acute cholecystitis March 2014  . Cellulitis of foot 05/19/2012  . Acute osteomyelitis, lower leg 05/21/2012   Assessment: 31 yoF from SNF with wheezing, congestion, and AMS.  PMHx: VTE, alcoholism, depression, HTN, CAD, and renal disease.  Pharmacy consulted to start Vancomycin dosing for PNA.    Allergies: Flagyl (nausea), PCN (hives), Sulfa (nausea)  4/1 >> Vancomycin  >> 4/1 >> Aztreonam  >>    Tmax: No data WBCs:19.7K Renal:SCr 1.12, estimated CrCl 26 (CG), 38 (N)  Goal of Therapy:  Vancomycin trough level 15-20 mcg/ml  Plan:  Vancomycin 1g IV x 1, then 500mg  IV q24h Aztreonam 1gm iv  q8hr F/u renal function, VT at Css, clinical course  Ralene Bathe, PharmD, BCPS 10/16/2013, 9:18 PM  Pager: [phone]

## 2013-09-16 NOTE — ED Provider Notes (Addendum)
CSN: [id number]     Arrival date & time 09/30/2013  1858 History   First MD Initiated Contact with Patient 10/08/2013 1903     Chief Complaint  Patient presents with  . Altered Mental Status     (Consider location/radiation/quality/duration/timing/severity/associated sxs/prior Treatment) Patient is a 80 y.o. female presenting with altered mental status. The history is provided by the patient. The history is limited by the condition of the patient.  Altered Mental Status Associated symptoms: no fever   pt with hx htn, thrombocytosis, ?hx copd/chf, presents from e[facility] via ems with altered loc, gen weakness, wheezing and congestion.  Pt very limited historian, only responds w occasional one word responses.  No fevers. Pt denies pain. Limited historian - level 5 caveat re: altered mental status.      Past Medical History  Diagnosis Date  . Vascular abnormality   . DVT (deep venous thrombosis)   . Alcoholism   . Depression   . Hypertension   . UTI (lower urinary tract infection)   . Chronic back pain   . Arthritis     all over  . Renal disorder   . Heart disease   . Hyperlipidemia   . Phlebitis   . UTI (urinary tract infection)   . CAD (coronary artery disease)   . Diarrhea   . Acute cholecystitis March 2014  . Cellulitis of foot 05/19/2012  . Acute osteomyelitis, lower leg 05/21/2012   Past Surgical History  Procedure Laterality Date  . Thrombectomy      R leg  . Shoulder surgery    . Hand surgery    . Appendectomy    . Coronary angioplasty with stent placement    . Spine surgery      x 3  . Exploratory laparotomy      associated with "trying to get pregnant" - low midline scar   Family History  Problem Relation Age of Onset  . Colon cancer Neg Hx   . Pancreatic cancer Sister    History  Substance Use Topics  . Smoking status: Current Every Day Smoker -- 0.25 packs/day    Types: Cigarettes  . Smokeless tobacco: Never Used  . Alcohol Use: No   OB History   Grav  Para Term Preterm Abortions TAB SAB Ect Mult Living                 Review of Systems  Unable to perform ROS: Mental status change  Constitutional: Negative for fever.  level 5 caveat, altered mental status.       Allergies  Flagyl; Penicillins; and Sulfa antibiotics  Home Medications   Current Outpatient Rx  Name  Route  Sig  Dispense  Refill  . albuterol (PROVENTIL) (2.5 MG/3ML) 0.083% nebulizer solution   Nebulization   Take 2.5 mg by nebulization every 4 (four) hours as needed for wheezing.         Marland Kitchen ALPRAZolam (XANAX) 0.25 MG tablet   Oral   Take 0.25 mg by mouth at bedtime as needed for anxiety. Take one capsule Mon,Tues,Wed,Thurs, and Fri.( 5 days weekly)         . amLODipine (NORVASC) 5 MG tablet   Oral   Take 5 mg by mouth daily.         Marland Kitchen anagrelide (AGRYLIN) 1 MG capsule   Oral   Take 1 capsule (1 mg total) by mouth 2 (two) times daily.   60 capsule   1   . aspirin 81 MG  chewable tablet   Oral   Chew 81 mg by mouth daily with breakfast.         . digoxin (LANOXIN) 0.125 MG tablet   Oral   Take 0.125 mg by mouth daily with breakfast.          . DULoxetine (CYMBALTA) 60 MG capsule   Oral   Take 60 mg by mouth daily.         . ferrous sulfate 325 (65 FE) MG tablet   Oral   Take 325 mg by mouth 2 (two) times daily with a meal.         . furosemide (LASIX) 20 MG tablet   Oral   Take 20 mg by mouth daily as needed.         . gabapentin (NEURONTIN) 300 MG capsule   Oral   Take 300 mg by mouth 2 (two) times daily.          Marland Kitchen HYDROcodone-acetaminophen (NORCO/VICODIN) 5-325 MG per tablet   Oral   Take 1 tablet by mouth every 4 (four) hours as needed for pain. Pain   60 tablet   0   . isosorbide mononitrate (IMDUR) 60 MG 24 hr tablet   Oral   Take 60 mg by mouth daily with breakfast.          . loperamide (IMODIUM A-D) 2 MG tablet   Oral   Take 2 mg by mouth 4 (four) times daily as needed for diarrhea or loose stools.          . memantine (NAMENDA) 5 MG tablet   Oral   Take 5 mg by mouth 2 (two) times daily.         . mirtazapine (REMERON) 7.5 MG tablet   Oral   Take 7.5 mg by mouth at bedtime.         Marland Kitchen morphine (MS CONTIN) 15 MG 12 hr tablet   Oral   Take 15 mg by mouth 3 (three) times daily. Take one tablet with a 30 mg tablet = 45 mg three times a day.         . morphine (MS CONTIN) 30 MG 12 hr tablet   Oral   Take 30 mg by mouth 3 (three) times daily. Take one 30 mg tablet with a 15 mg tablet= 45 mg three times a day         . Multiple Vitamins-Minerals (PRESERVISION AREDS PO)   Oral   Take 1 capsule by mouth 2 (two) times daily.         Marland Kitchen omeprazole (PRILOSEC) 40 MG capsule   Oral   Take 1 capsule (40 mg total) by mouth daily.   90 capsule   3   . polyethylene glycol (MIRALAX / GLYCOLAX) packet   Oral   Take 17 g by mouth daily as needed (constipation).          . promethazine (PHENERGAN) 25 MG tablet   Oral   Take 25 mg by mouth every 6 (six) hours as needed for nausea or vomiting.         . traMADol (ULTRAM) 50 MG tablet   Oral   Take 50 mg by mouth 2 (two) times daily as needed for pain.          BP 154/64  Pulse 69  SpO2 93% Physical Exam  Nursing note and vitals reviewed. Constitutional:  Weak/frail appearing, lethargic.   HENT:  Mouth/Throat: Oropharynx is clear and moist.  Eyes: Conjunctivae are normal. Pupils are equal, round, and reactive to light. No scleral icterus.  Neck: Neck supple. No tracheal deviation present. No thyromegaly present.  No stiffness or rigidity. No bruit  Cardiovascular: Normal rate, normal heart sounds and intact distal pulses.   Irregular rhythm.   Pulmonary/Chest: Effort normal. She has wheezes. She has rales.  Rales right  Abdominal: Soft. Normal appearance. She exhibits distension. She exhibits no mass. There is tenderness. There is no rebound and no guarding.  Distended. Mildly tender.   Genitourinary:  No cva  tenderness  Musculoskeletal: Normal range of motion. She exhibits no edema and no tenderness.  Neurological:  Lethargic, slow to respond. Generally weak, follows commands poorly. Moves bil ext purposefully.   Skin: Skin is warm and dry. No rash noted.  Psychiatric:  Lethargic, slow to respond.     ED Course  Procedures (including critical care time)  Results for orders placed during the hospital encounter of 09/21/2013  CBC      Result Value Ref Range   WBC 19.7 (*) 4.0 - 10.5 K/uL   RBC 3.65 (*) 3.87 - 5.11 MIL/uL   Hemoglobin 9.8 (*) 12.0 - 15.0 g/dL   HCT 32.9 (*) 36.0 - 46.0 %   MCV 90.1  78.0 - 100.0 fL   MCH 26.8  26.0 - 34.0 pg   MCHC 29.8 (*) 30.0 - 36.0 g/dL   RDW 26.3 (*) 11.5 - 15.5 %   Platelets 1232 (*) 150 - 400 K/uL  COMPREHENSIVE METABOLIC PANEL      Result Value Ref Range   Sodium 135 (*) 137 - 147 mEq/L   Potassium 5.4 (*) 3.7 - 5.3 mEq/L   Chloride 100  96 - 112 mEq/L   CO2 23  19 - 32 mEq/L   Glucose, Bld 105 (*) 70 - 99 mg/dL   BUN 39 (*) 6 - 23 mg/dL   Creatinine, Ser 1.12 (*) 0.50 - 1.10 mg/dL   Calcium 8.8  8.4 - 10.5 mg/dL   Total Protein 6.8  6.0 - 8.3 g/dL   Albumin 3.5  3.5 - 5.2 g/dL   AST 41 (*) 0 - 37 U/L   ALT 15  0 - 35 U/L   Alkaline Phosphatase 164 (*) 39 - 117 U/L   Total Bilirubin 1.0  0.3 - 1.2 mg/dL   GFR calc non Af Amer 42 (*) >90 mL/min   GFR calc Af Amer 49 (*) >90 mL/min  BLOOD GAS, ARTERIAL      Result Value Ref Range   O2 Content 2.0     Delivery systems NASAL CANNULA     pH, Arterial 7.353  7.350 - 7.450   pCO2 arterial 44.6  35.0 - 45.0 mmHg   pO2, Arterial 65.5 (*) 80.0 - 100.0 mmHg   Bicarbonate 24.1 (*) 20.0 - 24.0 mEq/L   TCO2 22.7  0 - 100 mmol/L   Acid-base deficit 0.9  0.0 - 2.0 mmol/L   O2 Saturation 91.1     Patient temperature 98.6     Collection site RIGHT RADIAL     Drawn by [id number]     Sample type ARTERIAL DRAW     Allens test (pass/fail) PASS  PASS  URINALYSIS, ROUTINE W REFLEX MICROSCOPIC      Result  Value Ref Range   Color, Urine YELLOW  YELLOW   APPearance CLEAR  CLEAR   Specific Gravity, Urine 1.022  1.005 - 1.030   pH 5.5  5.0 - 8.0  Glucose, UA NEGATIVE  NEGATIVE mg/dL   Hgb urine dipstick NEGATIVE  NEGATIVE   Bilirubin Urine NEGATIVE  NEGATIVE   Ketones, ur NEGATIVE  NEGATIVE mg/dL   Protein, ur 30 (*) NEGATIVE mg/dL   Urobilinogen, UA 1.0  0.0 - 1.0 mg/dL   Nitrite NEGATIVE  NEGATIVE   Leukocytes, UA NEGATIVE  NEGATIVE  LIPASE, BLOOD      Result Value Ref Range   Lipase 23  11 - 59 U/L  DIGOXIN LEVEL      Result Value Ref Range   Digoxin Level 1.2  0.8 - 2.0 ng/mL  PRO B NATRIURETIC PEPTIDE      Result Value Ref Range   Pro B Natriuretic peptide (BNP) 23766.0 (*) 0 - 450 pg/mL  URINE MICROSCOPIC-ADD ON      Result Value Ref Range   WBC, UA 0-2  <3 WBC/hpf   RBC / HPF 0-2  <3 RBC/hpf   Dg Shoulder Right  08/31/2013   CLINICAL DATA:  s/p fall increased pain/edema  EXAM: RIGHT SHOULDER - 2+ VIEW  COMPARISON:  None.  FINDINGS: There is no evidence of fracture or dislocation. Joint space narrowing, subchondral sclerosis, peripheral hypertrophic spurring is identified within the glenohumeral joint. Small punctate calcifications project within the anterior lateral soft tissues of the shoulder.  IMPRESSION: Moderate to severe osteoarthritic changes within the right shoulder. No evidence of acute osseous abnormalities. Possibly dystrophic calcifications within the superior lateral soft tissues of the shoulder. There is persistent clinical concern further evaluation with MRI is recommended.   Electronically Signed   By: Margaree Mackintosh M.D.   On: 08/31/2013 17:54   Dg Hip Complete Right  08/31/2013   CLINICAL DATA:  Pain status post fall  EXAM: RIGHT HIP - COMPLETE 2+ VIEW  COMPARISON:  None.  FINDINGS: There is no evidence of hip fracture or dislocation. Peripheral osteophytes, and periarticular sclerosis is appreciated. A right iliac artery stent is identified. Vascular  calcifications identified within the femoral artery on the right.  IMPRESSION: No evidence of acute osseous abnormalities. Osteoarthritic changes within the right hip. Atherosclerotic changes identified within the vasculature.   Electronically Signed   By: Margaree Mackintosh M.D.   On: 08/31/2013 17:42   Ct Head Wo Contrast  08/31/2013   CLINICAL DATA:  Headaches.  Vertigo.  Falls.  EXAM: CT HEAD WITHOUT CONTRAST  TECHNIQUE: Contiguous axial images were obtained from the base of the skull through the vertex without intravenous contrast.  COMPARISON:  None.  FINDINGS: Calcified transverse ligament at C1-2. Basilar artery atherosclerotic calcification noted.  The brainstem, cerebellum, cerebral peduncles, thalamus, basal ganglia, basilar cisterns, and ventricular system appear within normal limits. Periventricular white matter and corona radiata hypodensities favor chronic ischemic microvascular white matter disease. No intracranial hemorrhage, mass lesion, or acute CVA. There is opacification of a single right-sided ethmoid air cell. Mild polypoid mucoperiosteal thickening noted in the right maxillary sinus and right sphenoid sinus.  There is atherosclerotic calcification of the cavernous carotid arteries bilaterally.  IMPRESSION: 1. No acute intracranial findings. 2. Periventricular white matter and corona radiata hypodensities favor chronic ischemic microvascular white matter disease. 3. Chronic right paranasal sinusitis. 4. Atherosclerosis.   Electronically Signed   By: Sherryl Barters M.D.   On: 08/31/2013 17:18   Dg Abd Acute W/chest     CLINICAL DATA:  Abdominal pain  EXAM: ACUTE ABDOMEN SERIES (ABDOMEN 2 VIEW & CHEST 1 VIEW)  COMPARISON:  Prior CT abdomen/ pelvis 01/24/2013 ; prior acute abdominal series  8 02/2013  FINDINGS: Increased right lower lobe patchy airspace opacity. Stable cardiomegaly. Atherosclerotic calcifications again noted in the transverse aorta. Left common carotid stent. Nodular  opacity in the right upper lung overlying the anterior aspect of the right second rib. Degenerative change in the left greater than right shoulder joints. Background bronchitic changes and underlying COPD.  No free air on the lateral decubitus view. Nonobstructed bowel gas pattern. Large volume of formed stool suggests constipation. Right iliac arterial stent. Extensive atherosclerotic vascular calcifications. Probable SMA stent.  IMPRESSION: 1. Patchy right lower lobe airspace disease concerning for pneumonia. 2. Somewhat nodular opacity in the right upper lung may represent an additional focus of infection/inflammation. However, pulmonary nodule is not excluded. Recommend follow-up chest x-ray in 4 weeks following appropriate course of therapy. If the abnormality persists, CT scan may become warranted. 3. Large colonic stool burden suggests constipation. 4. No evidence of obstruction or free air. 5. Extensive atherosclerotic vascular disease without evidence of left common carotid, SMA and right iliac arterial stents.   Electronically Signed   By: Jacqulynn Cadet M.D.   On:  19:42       EKG Interpretation   Date/Time:  Wednesday September 16 2013 19:08:34 EDT Ventricular Rate:  87 PR Interval:    QRS Duration: 130 QT Interval:  384 QTC Calculation: 462 R Axis:   -149 Text Interpretation:  Atrial fibrillation Nonspecific intraventricular  conduction delay No significant change since last tracing Confirmed by  Samy Ryner  MD, Maydelin Deming ([id number]) on 09/21/2013 7:23:32 PM      MDM  Iv ns. o2 [place]. Labs. Cxr.  Reviewed nursing notes and prior charts for additional history.   Reviewed notes from ecf, pt is dnr - as such, no cpr, no intubation.   cxr c/w pna, wbc elev.  Will rx for suspected hcap.  Pt w pcn allergy.  Aztreonam and vanc.   Alb and atrovent neb re wheezing.  Recheck pt resting quietly.   family present - they noted marked change in pts condition in past day. States yesterday was  conversant, confused, mental status at baseline - they indicate ?mild dementia at baseline. Also state at pts baseline is non ambulatory, either in bed or sat in chair.  Thrombocytosis noted on labs, c/w recent admission.  Med service called to admit.  Dr Forde Dandy was listed on one of ecf papers - called Dr Osborne Casco to discuss pt - he clarified, and contacted ecf to verify, that pt is a patient of Dr Lysle Rubens, not Dr Forde Dandy (Dr Jill Poling name inadvertently appearing on some of pts ecf forms). Hospitalist called to admit.  Discussed pt with Dr Caryn Section, requests med/surg bed, temp orders.       Mirna Mires, MD 09/30/2013 2206

## 2013-09-16 NOTE — Progress Notes (Signed)
CSW attempted to engage the patient with limited success as she mumbled and was not able to clearly communicate.  CSW met with family at bedside to complete this assessment.  Pat Hatley(niece/POA) and her husband Mikki Santee were at the bedside.  They report that the patient was with any complication on yesterday so the fast decline in her health has confused them.  At the this the family reports it is the intent for the patient to return to [facility] once medically cleared.  Pat encouraged to call her for any concerns at [phone].     Chesley Noon, MSW, [place], 10/15/2013 Evening Clinical Social Worker [phone]

## 2013-09-16 NOTE — Progress Notes (Signed)
ED called RT to come and see patient who came from SNF. Patient was wheezing. Rhoncis, and diminished. Patients has periods of apnea  RT collected ABG. Results given to MD. Patient was given Albuterol 5 mg and Atrovent 0.5%. RT will continue to monitor.

## 2013-09-17 ENCOUNTER — Encounter (HOSPITAL_COMMUNITY): Payer: Self-pay

## 2013-09-17 DIAGNOSIS — R0600 Dyspnea, unspecified: Secondary | ICD-10-CM

## 2013-09-17 DIAGNOSIS — N179 Acute kidney failure, unspecified: Secondary | ICD-10-CM

## 2013-09-17 DIAGNOSIS — R4182 Altered mental status, unspecified: Secondary | ICD-10-CM

## 2013-09-17 DIAGNOSIS — IMO0002 Reserved for concepts with insufficient information to code with codable children: Secondary | ICD-10-CM

## 2013-09-17 DIAGNOSIS — R0989 Other specified symptoms and signs involving the circulatory and respiratory systems: Secondary | ICD-10-CM

## 2013-09-17 DIAGNOSIS — R0609 Other forms of dyspnea: Secondary | ICD-10-CM

## 2013-09-17 DIAGNOSIS — E46 Unspecified protein-calorie malnutrition: Secondary | ICD-10-CM

## 2013-09-17 DIAGNOSIS — R451 Restlessness and agitation: Secondary | ICD-10-CM

## 2013-09-17 DIAGNOSIS — Z515 Encounter for palliative care: Secondary | ICD-10-CM

## 2013-09-17 LAB — BASIC METABOLIC PANEL
BUN: 41 mg/dL — ABNORMAL HIGH (ref 6–23)
CO2: 23 meq/L (ref 19–32)
CREATININE: 1.22 mg/dL — AB (ref 0.50–1.10)
Calcium: 8.7 mg/dL (ref 8.4–10.5)
Chloride: 101 mEq/L (ref 96–112)
GFR calc Af Amer: 44 mL/min — ABNORMAL LOW (ref >90)
GFR, EST NON AFRICAN AMERICAN: 38 mL/min — AB (ref >90)
Glucose, Bld: 117 mg/dL — ABNORMAL HIGH (ref 70–99)
Potassium: 5.5 mEq/L — ABNORMAL HIGH (ref 3.7–5.3)
SODIUM: 137 meq/L (ref 137–147)

## 2013-09-17 LAB — CBC
HCT: 31.9 % — ABNORMAL LOW (ref 36.0–46.0)
HEMOGLOBIN: 9.3 g/dL — AB (ref 12.0–15.0)
MCH: 26.9 pg (ref 26.0–34.0)
MCHC: 29.2 g/dL — ABNORMAL LOW (ref 30.0–36.0)
MCV: 92.2 fL (ref 78.0–100.0)
PLATELETS: 1112 10*3/uL — AB (ref 150–400)
RBC: 3.46 MIL/uL — ABNORMAL LOW (ref 3.87–5.11)
RDW: 26.4 % — AB (ref 11.5–15.5)
WBC: 17 10*3/uL — ABNORMAL HIGH (ref 4.0–10.5)

## 2013-09-17 LAB — PATHOLOGIST SMEAR REVIEW

## 2013-09-17 MED ORDER — BISACODYL 10 MG RE SUPP: 10.0000 mg | Freq: Every day | RECTAL | Status: DC | PRN

## 2013-09-17 MED ORDER — MORPHINE SULFATE 2 MG/ML IJ SOLN
1.0000 mg | INTRAMUSCULAR | Status: DC | PRN
  Administered 2013-09-17: 1 mg via INTRAVENOUS
  Filled 2013-09-17: qty 1

## 2013-09-17 MED ORDER — HYDRALAZINE HCL 20 MG/ML IJ SOLN
5.0000 mg | Freq: Four times a day (QID) | INTRAMUSCULAR | Status: DC | PRN
  Filled 2013-09-17: qty 0.25

## 2013-09-17 MED ORDER — MORPHINE SULFATE 2 MG/ML IJ SOLN
2.0000 mg | INTRAMUSCULAR | Status: DC | PRN
  Administered 2013-09-17 – 2013-09-18 (×11): 2 mg via INTRAVENOUS
  Filled 2013-09-17 (×10): qty 1

## 2013-09-17 MED ORDER — LORAZEPAM 2 MG/ML IJ SOLN
1.0000 mg | INTRAMUSCULAR | Status: DC | PRN
  Administered 2013-09-17 – 2013-09-18 (×5): 1 mg via INTRAVENOUS
  Filled 2013-09-17 (×5): qty 1

## 2013-09-17 MED ORDER — DIGOXIN 0.25 MG/ML IJ SOLN: 0.1250 mg | INTRAMUSCULAR | Status: DC

## 2013-09-17 MED ORDER — LIP MEDEX EX OINT
TOPICAL_OINTMENT | CUTANEOUS | Status: DC | PRN
  Administered 2013-09-17: 1 via TOPICAL
  Administered 2013-09-17: 17:00:00 via TOPICAL
  Filled 2013-09-17: qty 7

## 2013-09-17 NOTE — Progress Notes (Signed)
[facility] PROGRESS NOTE  Tamara Turner VEH:[id number] DOB: 05/01/1922 DOA: 10/12/2013 PCP: not assigned  Brief narrative: 80 y.o. female with a history of atrial fibrillation, ischemic heart disease, dementia, and thrombocytosis who presented from [facility] facility with a report of altered mental status. Per nursing home reported pt was more lethargic , having difficult time getting up from the bed, less responsive and having congestion and wheezing. In ED, pt was afebrile and hemodynamically stable. Blood work revealed WBC count of 19.7, hemoglobin 9.8 and platelets of 1232. Sodium was 135, potassium 5.4 and creatinine 1.12. Based on abdominal x ray there was evidence of patchy right lower lobe airspace disease concerning for pneumonia.   Assessment/Plan:  Principal Problem:   HCAP (healthcare-associated pneumonia) - pt was started on aztreonam and vancomycin. She is not responsive to verbal stimuli but somewhat responsive to painful stimuli. Asked for palliative care for goals of care. Family to decide on hospice. Abx now discontinued as focus is on comfort care. - albuterol every 6 hours scheduled and every 2 hours PRN - morphine 2 mg IV every 1 hour PRN Active Problems:   Hyperkalemia - mild elevation, 5.4, repeat level 5.5 - no further blood work to ensure comfort   Leukocytosis - secondary to HCAP - abx stopped as focus on comfort   Acute renal failure - secondary to dehydration   Acute encephalopathy. Delirium, failure to thrive - progressive chronic medical illness - palliative consulted for goals of care   Severe protein calorie malnutrition - secondary to chronic medical illness - no PO intake due to lethargy   Code Status: DNR/DNI Family Communication: no family at the bedside  Disposition Plan: remains inpatient   Leisa Lenz, MD  [facility] Pager [phone]  If 7PM-7AM, please contact night-coverage [email or web address] Password  [facility] 09/17/2013, 8:47 AM   LOS: 1 day   Consultants:  PCT   Procedures:  None   Antibiotics:  Aztreonam 09/28/2013 -->  Vancomycin 10/13/2013 -->  HPI/Subjective: No overnight events.   Objective: Filed Vitals:   09/17/13 0006 09/17/13 0511 09/17/13 0758 09/17/13 0818  BP: 137/68 146/55    Pulse: 77 95    Temp: 97.8 F (36.6 C) 98.1 F (36.7 C)    TempSrc: Axillary Axillary    Resp: 6 6  11   Height:      Weight:      SpO2: 98% 100% 100% 100%   No intake or output data in the 24 hours ending 09/17/13 0847  Exam:   General:  Pt is sleeping, does not respond to verbal stimuli, has Ventimask on  Cardiovascular: Regular rate and rhythm, S1/S2, appreciated   Respiratory: diminished breath sounds bilaterally, wheezing  Abdomen: Soft, non tender, non distended, bowel sounds present, no guarding  Extremities: multiple excoriations especially on lower extremities, erythema on heels, pulses DP and PT palpable bilaterally  Neuro: Grossly nonfocal  Data Reviewed: Basic Metabolic Panel:  Recent Labs Lab 10/11/2013 1950 09/17/13 0639  NA 135* 137  K 5.4* 5.5*  CL 100 101  CO2 23 23  GLUCOSE 105* 117*  BUN 39* 41*  CREATININE 1.12* 1.22*  CALCIUM 8.8 8.7   Liver Function Tests:  Recent Labs Lab 09/27/2013 1950  AST 41*  ALT 15  ALKPHOS 164*  BILITOT 1.0  PROT 6.8  ALBUMIN 3.5    Recent Labs Lab 10/01/2013 1950  LIPASE 23   No results found for this basename: AMMONIA,  in the last 168 hours CBC:  Recent Labs Lab  10/08/2013 1950 09/17/13 0639  WBC 19.7* 17.0*  HGB 9.8* 9.3*  HCT 32.9* 31.9*  MCV 90.1 92.2  PLT 1232* 1112*   Cardiac Enzymes: No results found for this basename: CKTOTAL, CKMB, CKMBINDEX, TROPONINI,  in the last 168 hours BNP: No components found with this basename: POCBNP,  CBG: No results found for this basename: GLUCAP,  in the last 168 hours  No results found for this or any previous visit (from the past 240 hour(s)).    Studies: Dg Abd Acute W/chest  10/08/2013   CLINICAL DATA:  Abdominal pain  EXAM: ACUTE ABDOMEN SERIES (ABDOMEN 2 VIEW & CHEST 1 VIEW)  COMPARISON:  Prior CT abdomen/ pelvis 01/24/2013 ; prior acute abdominal series 8 02/2013  FINDINGS: Increased right lower lobe patchy airspace opacity. Stable cardiomegaly. Atherosclerotic calcifications again noted in the transverse aorta. Left common carotid stent. Nodular opacity in the right upper lung overlying the anterior aspect of the right second rib. Degenerative change in the left greater than right shoulder joints. Background bronchitic changes and underlying COPD.  No free air on the lateral decubitus view. Nonobstructed bowel gas pattern. Large volume of formed stool suggests constipation. Right iliac arterial stent. Extensive atherosclerotic vascular calcifications. Probable SMA stent.  IMPRESSION: 1. Patchy right lower lobe airspace disease concerning for pneumonia. 2. Somewhat nodular opacity in the right upper lung may represent an additional focus of infection/inflammation. However, pulmonary nodule is not excluded. Recommend follow-up chest x-ray in 4 weeks following appropriate course of therapy. If the abnormality persists, CT scan may become warranted. 3. Large colonic stool burden suggests constipation. 4. No evidence of obstruction or free air. 5. Extensive atherosclerotic vascular disease without evidence of left common carotid, SMA and right iliac arterial stents.   Electronically Signed   By: Jacqulynn Cadet M.D.   On: 10/03/2013 19:42    Scheduled Meds: . albuterol  2.5 mg Nebulization Q6H  . aztreonam  1 g Intravenous 3 times per day  . [START ON 09/27/13] digoxin  0.125 mg Intravenous QODAY  . heparin  5,000 Units Subcutaneous 3 times per day  . vancomycin  500 mg Intravenous Q24H   Continuous Infusions: . sodium chloride 1,000 mL (09/17/13 0114)

## 2013-09-17 NOTE — Progress Notes (Signed)
INITIAL NUTRITION ASSESSMENT  DOCUMENTATION CODES Per approved criteria  -Underweight   INTERVENTION: - Nutrition per palliative care meeting - Will continue to monitor   NUTRITION DIAGNOSIS: Inadequate oral intake related to inability to eat as evidenced by NPO.   Goal: Nutrition per palliative care discussion  Monitor:  Weights, labs, diet advancement, goals of care   Reason for Assessment: low braden, malnutrition screening tool   80 y.o. female  Admitting Dx: HCAP (healthcare-associated pneumonia)  ASSESSMENT: Pt with history of atrial fibrillation, ischemic heart disease, dementia, and thrombocytosis who presents from [place] with a report of altered mental status. The patient was more confused and not as responsive. The nursing staff had more difficulty trying to get her out of bed to the chair. The patient was so weak that she was unable to hold her cup. They noticed that she had chest congestion and some wheezing. Found to have healthcare-associated pneumonia with poor prognosis. Palliative care has been consulted.   Pt asleep with no family present. Observed pt with cachetic face. Called [facility] facility, staff reports pt did not eat lunch yesterday but ate breakfast. Staff states appetite down in the past week however weight has been stable. On diabetic diet with thin liquids typically but was on clear liquid diet the past few days as pt c/o stomach pain. Not on any nutritional supplements but did drink chocolate milk often.   Potassium elevated BUN/Cr elevated with low GFR Alk phos and AST elevated    Height: Ht Readings from Last 1 Encounters:  10/06/2013 _0  (1.626 m)    Weight: Wt Readings from Last 1 Encounters:  10/13/2013 105 lb (47.628 kg)    Ideal Body Weight: 120 lbs   % Ideal Body Weight: 87%  Wt Readings from Last 10 Encounters:  10/11/2013 105 lb (47.628 kg)  08/14/13 111 lb 12.8 oz (50.712 kg)  06/15/13 127 lb (57.607 kg)   05/07/13 137 lb 8 oz (62.37 kg)  05/04/13 126 lb (57.153 kg)  03/31/13 126 lb 8 oz (57.38 kg)  08/28/12 109 lb 9.6 oz (49.714 kg)  05/19/12 97 lb 14.2 oz (44.4 kg)  04/01/12 102 lb (46.267 kg)  03/27/12 104 lb (47.174 kg)    Usual Body Weight: 111 lbs in February 2015  % Usual Body Weight: 94%  BMI:  Body mass index is 18.01 kg/(m^2). Underweight  Estimated Nutritional Needs: Kcal: 1450-1650 Protein: 70-85g Fluid: 1.4-1.6L/day  Skin: Non-pitting LUE edema  Diet Order: NPO  EDUCATION NEEDS: -No education needs identified at this time  No intake or output data in the 24 hours ending 09/17/13 1356  Last BM: PTA  Labs:   Recent Labs Lab 10/09/2013 1950 09/17/13 0639  NA 135* 137  K 5.4* 5.5*  CL 100 101  CO2 23 23  BUN 39* 41*  CREATININE 1.12* 1.22*  CALCIUM 8.8 8.7  GLUCOSE 105* 117*    CBG (last 3)  No results found for this basename: GLUCAP,  in the last 72 hours  Scheduled Meds: . albuterol  2.5 mg Nebulization Q6H    Continuous Infusions: . sodium chloride 20 mL/hr at 09/17/13 1218    Past Medical History  Diagnosis Date  . Vascular abnormality   . DVT (deep venous thrombosis)   . Alcoholism   . Depression   . Hypertension   . UTI (lower urinary tract infection)   . Chronic back pain   . Arthritis     all over  . Renal disorder   .  Heart disease   . Hyperlipidemia   . Phlebitis   . UTI (urinary tract infection)   . CAD (coronary artery disease)   . Diarrhea   . Acute cholecystitis March 2014  . Cellulitis of foot 05/19/2012  . Acute osteomyelitis, lower leg 05/21/2012  . Ischemic heart disease 04/01/2012  . Atrial fibrillation 04/01/2012  . Thrombocytosis 04/08/2012    PLT count was 1172 02/2011 outside records On hydrea   . IBS (irritable bowel syndrome) 03/31/2013    Long history known from chart review of outside records when she lives in [place]   . Dilated intrahepatic bile duct 08/20/2012    Chronic issues known from imaging  in [place] and here not thought to be clinically significant   . Dementia     Past Surgical History  Procedure Laterality Date  . Thrombectomy      R leg  . Shoulder surgery    . Hand surgery    . Appendectomy    . Coronary angioplasty with stent placement    . Spine surgery      x 3  . Exploratory laparotomy      associated with "trying to get pregnant" - low midline scar    [name] MS, RD, LDN [phone] Pager [phone] After Hours Pager

## 2013-09-17 NOTE — Consult Note (Signed)
Patient UE:[id number] Tamara Turner      DOB: [birth year]      BJY:[id number]     Consult Note from the Palliative Medicine Team at [facility] Requested by: Dr Tamara Turner     PCP: Tamara Low, MD Reason for Consultation:Clarification of [place] and options     Phone Number:[phone]  Assessment of patients Current state:  Tamara Turner is a 80 y.o. female with a history of atrial fibrillation, ischemic heart disease, dementia, and thrombocytosis who presents from [place] with a report of altered mental status.   Acute abdominal series reveals patchy right lower lobe airspace disease concerning for pneumonia; nodular opacity in the right upper long which may represent a focus of infection; large colonic stool burden; and extensive arthrosclerotic vascular disease.   Her family reports continued physical, functional and cognitive decline.  "She has had no quality of life for years".  Hope for this patient is to allow for a natural death within the context of comfort, quality and dignity.   This NP Tamara Turner reviewed medical records, received report from team, assessed the patient and then meet at the patient's bedside along with her niece and main decision maker Tamara Turner # C [phone]  to discuss diagnosis prognosis, [place], EOL wishes disposition and options.  A detailed discussion was had today regarding advanced directives.  Concepts specific to code status, artifical feeding and hydration, continued IV antibiotics and rehospitalization was had.  The difference between a aggressive medical intervention path  and a palliative comfort care path for this patient at this time was had.  Values and goals of care important to patient and family were attempted to be elicited.  Concept of Hospice and Palliative Care were discussed  Natural trajectory and expectations at EOL were discussed.  Questions and concerns addressed.  Hard Choices/Gone from my Sight booklets left  for review. Family encouraged to call with questions or concerns.  PMT will continue to support holistically.   Goals of Care: 1.  Code Status: DNRDNI-comfort    2. Scope of Treatment: 1. Vital Signs: daily  2. Respiratory/Oxygen:  For comfort only/only nasal cannula 3. Nutritional Support/Tube Feeds: no artificial feeding now or in the future 4. Antibiotics:none 5. Blood Products:none 6. IVF:KVO for medications only 7. Review of Medications to be discontinued:minimize for comfort 8. Labs:none 9. Telemetry:no    3. Disposition:  Dependant on outcomes.  Patient in need of expert symptom assessment and management as this time.  Family does not want a return to SNF.  Prognosis is likely days.   4. Symptom Management:   1. Anxiety/Agitation:  Ativan 1 mg IV every 4 hrs prn 2. Pain/Dyspnea: Morphine 2 mg IV every 1 hr prn       Patient on high dose oral morphine prior to admission (MS Contin 45 mg po tid)       Consider morphine gtt if patient is needing frequent dosing. 3. Bowel Regimen: Dulcolax supp  5. Psychosocial:  Emotional support offered to family at bedside.  Ms Tamara Turner is at peace with her decision for comfort  6. Spiritual: declined Chaplain consult at this time     Patient Documents Completed or Given: Document Given Completed  Advanced Directives Pkt    MOST  yes  DNR    Gone from My Sight  yes  Hard Choices  yes    Brief HPI: Tamara Turner is a 80 y.o. female with a history of atrial fibrillation, ischemic heart disease, dementia,  and thrombocytosis who presents from [facility] facility with a report of altered mental status.   Acute abdominal series reveals patchy right lower lobe airspace disease concerning for pneumonia; nodular opacity in the right upper long which may represent a focus of infection; large colonic stool burden; and extensive arthrosclerotic vascular disease.   Her family reports continued physical, functional and cognitive  decline.  "She has had no quality of life for years".  Hope for this patient is to allow for a natural death within the context of comfort, quality and dignity.    ROS: unable to illicit at this time   PMH:  Past Medical History  Diagnosis Date  . Vascular abnormality   . DVT (deep venous thrombosis)   . Alcoholism   . Depression   . Hypertension   . UTI (lower urinary tract infection)   . Chronic back pain   . Arthritis     all over  . Renal disorder   . Heart disease   . Hyperlipidemia   . Phlebitis   . UTI (urinary tract infection)   . CAD (coronary artery disease)   . Diarrhea   . Acute cholecystitis March 2014  . Cellulitis of foot 05/19/2012  . Acute osteomyelitis, lower leg 05/21/2012  . Ischemic heart disease 04/01/2012  . Atrial fibrillation 04/01/2012  . Thrombocytosis 04/08/2012    PLT count was 1172 02/2011 outside records On hydrea   . IBS (irritable bowel syndrome) 03/31/2013    Long history known from chart review of outside records when she lives in [place]   . Dilated intrahepatic bile duct 08/20/2012    Chronic issues known from imaging in [place] and here not thought to be clinically significant   . Dementia      PSH: Past Surgical History  Procedure Laterality Date  . Thrombectomy      R leg  . Shoulder surgery    . Hand surgery    . Appendectomy    . Coronary angioplasty with stent placement    . Spine surgery      x 3  . Exploratory laparotomy      associated with "trying to get pregnant" - Turner midline scar   I have reviewed the FH and SH and  If appropriate update it with new information. Allergies  Allergen Reactions  . Flagyl [Metronidazole] Nausea Only  . Penicillins Hives  . Sulfa Antibiotics Nausea Only   Scheduled Meds: . albuterol  2.5 mg Nebulization Q6H  . aztreonam  1 g Intravenous 3 times per day   Continuous Infusions: . sodium chloride 1,000 mL (09/17/13 0114)   PRN Meds:.albuterol, lip balm, LORazepam, morphine  injection, ondansetron (ZOFRAN) IV, ondansetron    BP 146/55  Pulse 95  Temp(Src) 98.1 F (36.7 C) (Axillary)  Resp 11  Ht 5\' 4"  (1.626 m)  Wt 47.628 kg (105 lb)  BMI 18.01 kg/m2  SpO2 100%   PPS: 20 % at best  No intake or output data in the 24 hours ending 09/17/13 1102 LBM: 09-17-13                       Physical Exam:  General: ill appearing, generrally uncomfortable, agitated (discussed with nursing and prn meds given) HEENT:  Dry buccal membranes, no exudate Chest: decreased in bases,  CVS: RRR Abdomen:soft NT  +BS Ext: without edema, noted de-pressure boots, BLE with noted bruising and discoloration Neuro: confused, unable to follow commands, speech garbled  Labs: CBC    Component Value Date/Time   WBC 17.0* 09/17/2013 0639   WBC 10.9* 08/14/2013 1418   RBC 3.46* 09/17/2013 0639   RBC 4.18 08/14/2013 1418   HGB 9.3* 09/17/2013 0639   HGB 10.7* 08/14/2013 1418   HCT 31.9* 09/17/2013 0639   HCT 35.6 08/14/2013 1418   PLT 1112* 09/17/2013 0639   PLT 1,344* 08/14/2013 1418   MCV 92.2 09/17/2013 0639   MCV 85.1 08/14/2013 1418   MCH 26.9 09/17/2013 0639   MCH 25.5 08/14/2013 1418   MCHC 29.2* 09/17/2013 0639   MCHC 30.0* 08/14/2013 1418   RDW 26.4* 09/17/2013 0639   RDW 29.0* 08/14/2013 1418   LYMPHSABS 0.6* 08/14/2013 1418   LYMPHSABS 1.3 02/09/2013 1540   MONOABS 1.3* 08/14/2013 1418   MONOABS 1.6* 02/09/2013 1540   EOSABS 0.1 08/14/2013 1418   EOSABS 0.3 02/09/2013 1540   BASOSABS 0.1 08/14/2013 1418   BASOSABS 0.2* 02/09/2013 1540    BMET    Component Value Date/Time   NA 137 09/17/2013 0639   NA 139 08/14/2013 1418   K 5.5* 09/17/2013 0639   K 4.2 08/14/2013 1418   CL 101 09/17/2013 0639   CO2 23 09/17/2013 0639   CO2 27 08/14/2013 1418   GLUCOSE 117* 09/17/2013 0639   GLUCOSE 90 08/14/2013 1418   BUN 41* 09/17/2013 0639   BUN 18.1 08/14/2013 1418   CREATININE 1.22* 09/17/2013 0639   CREATININE 0.8 08/14/2013 1418   CALCIUM 8.7 09/17/2013 0639   CALCIUM 8.9 08/14/2013 1418   GFRNONAA 38*  09/17/2013 0639   GFRAA 44* 09/17/2013 0639    CMP     Component Value Date/Time   NA 137 09/17/2013 0639   NA 139 08/14/2013 1418   K 5.5* 09/17/2013 0639   K 4.2 08/14/2013 1418   CL 101 09/17/2013 0639   CO2 23 09/17/2013 0639   CO2 27 08/14/2013 1418   GLUCOSE 117* 09/17/2013 0639   GLUCOSE 90 08/14/2013 1418   BUN 41* 09/17/2013 0639   BUN 18.1 08/14/2013 1418   CREATININE 1.22* 09/17/2013 0639   CREATININE 0.8 08/14/2013 1418   CALCIUM 8.7 09/17/2013 0639   CALCIUM 8.9 08/14/2013 1418   PROT 6.8 08-Oct-2013 1950   PROT 6.4 08/14/2013 1418   ALBUMIN 3.5 10/08/13 1950   ALBUMIN 3.3* 08/14/2013 1418   AST 41* Oct 08, 2013 1950   AST 26 08/14/2013 1418   ALT 15 Oct 08, 2013 1950   ALT 10 08/14/2013 1418   ALKPHOS 164* Oct 08, 2013 1950   ALKPHOS 111 08/14/2013 1418   BILITOT 1.0 October 08, 2013 1950   BILITOT 0.68 08/14/2013 1418   GFRNONAA 38* 09/17/2013 0639   GFRAA 44* 09/17/2013 0639     Time In Time Out Total Time Spent with Patient Total Overall Time  1030 1145 70 min 75 min    Greater than 50%  of this time was spent counseling and coordinating care related to the above assessment and plan.   Tamara Lessen NP  Palliative Medicine Team Team Phone # [phone] Pager [phone]  Discussed with nursing and CM

## 2013-09-17 NOTE — Progress Notes (Signed)
Clinical Social Work  CSW received handoff from ED SW who reported that patient was admitted from [facility]. Patient's family request that patient return to [facility] if able. CSW met with patient and niece (who is also HCPOA) at bedside. CSW introduced myself and explained role. HCPOA thanked CSW for visit but reports she wants to talk with RN re: patient's status. CSW informed RN of family's request and completed FL2. CSW will continue to follow to assist with DC planning.  [place], [place] [phone]

## 2013-09-17 NOTE — Care Management Note (Unsigned)
    Page 1 of 1   09/17/2013     3:04:39 PM   CARE MANAGEMENT NOTE 09/17/2013  Patient:  Tamara Turner, Tamara Turner   Account Number:  [id number]  Date Initiated:  09/17/2013  Documentation initiated by:  [facility]  Subjective/Objective Assessment:   80 year old female admitted with HCAP, AMS and respiratory failure.     Action/Plan:   From [facility] SNF.   Anticipated DC Date:  09/20/2013   Anticipated DC Plan:  SKILLED NURSING FACILITY  In-house referral  Clinical Social Worker      DC Planning Services  CM consult      Choice offered to / List presented to:             Status of service:  In process, will continue to follow Medicare Important Message given?  NA - LOS <3 / Initial given by admissions (If response is "NO", the following Medicare IM given date fields will be blank) Date Medicare IM given:   Date Additional Medicare IM given:    Discharge Disposition:    Per UR Regulation:  Reviewed for med. necessity/level of care/duration of stay  If discussed at [place] of Stay Meetings, dates discussed:    Comments:  09/17/13 Allene Dillon RN BSN [phone] Dr Charlies Silvers asked me to follow up with palliative care team after their consult with pt and family in ergards to GIP status. Discussed case with Wadie Lessen and Kizzie Fantasia. They both feel she is appropriate. I met with pt's niece and brother to offer choice for hospice agency. They stated they wanted to think about it and call me with their decision. I will then call the agency of their choice and intiate pt being converted to GIP status.

## 2013-09-17 NOTE — Progress Notes (Addendum)
Rt could not get vitals before doing her neb tx. When RT enter room pt was pulling at her foley catheter and her IV was out. Pt had blood all over her hands and bed sheets. Pt stating" I'm in hell ". RN at bedside.

## 2013-09-17 NOTE — Progress Notes (Addendum)
Rt did not do vitals before given neb tx. Pt has mitts on and resting comfortable. Pt family waiting on palliative care meeting. Pt actively dying.

## 2013-09-18 DIAGNOSIS — K1370 Unspecified lesions of oral mucosa: Secondary | ICD-10-CM

## 2013-09-18 MED ORDER — SCOPOLAMINE 1 MG/3DAYS TD PT72
1.0000 | MEDICATED_PATCH | TRANSDERMAL | Status: DC
  Administered 2013-09-18: 1.5 mg via TRANSDERMAL
  Filled 2013-09-18: qty 1

## 2013-09-18 MED ORDER — MORPHINE BOLUS VIA INFUSION
1.0000 mg | INTRAVENOUS | Status: DC | PRN
  Administered 2013-09-18 (×2): 1 mg via INTRAVENOUS
  Filled 2013-09-18: qty 1

## 2013-09-18 MED ORDER — MORPHINE SULFATE 10 MG/ML IJ SOLN
1.0000 mg/h | INTRAVENOUS | Status: DC
  Administered 2013-09-18: 1 mg/h via INTRAVENOUS
  Filled 2013-09-18: qty 10

## 2013-09-18 MED ORDER — ATROPINE SULFATE 1 % OP SOLN
4.0000 [drp] | OPHTHALMIC | Status: DC | PRN
  Filled 2013-09-18: qty 2

## 2013-10-12 ENCOUNTER — Other Ambulatory Visit: Payer: Medicare Other

## 2013-10-12 ENCOUNTER — Ambulatory Visit: Payer: Medicare Other

## 2013-10-16 NOTE — Progress Notes (Signed)
Wasted Morphine drip.  85 ml wasted in the sink. Aydien Majette, Barbette Or RN  Sherer,Chalise Pe RN

## 2013-10-16 NOTE — Discharge Summary (Addendum)
  Death Summary  Tamara Turner ZMO:[id number] DOB: 05-26-1922 DOA: 28-Sep-2013  PCP: Wenda Low, MD PCP/Office notified  Admit date: 09/28/13 Date of Death: September 30, 2013  Final Diagnoses:  Principal Problem:   HCAP (healthcare-associated pneumonia) Active Problems:   Ischemic heart disease   Atrial fibrillation   Thrombocytosis   Anemia   Encephalopathy acute   Respiratory failure with hypoxia   Hyperkalemia   COPD (chronic obstructive pulmonary disease)   Constipation   Dementia   Palliative care encounter   Dyspnea   Agitation   History of present illness:   80 y.o. female with a history of atrial fibrillation, ischemic heart disease, dementia, and thrombocytosis who presented from [facility] with a report of altered mental status. Per nursing home reported pt was more lethargic , having difficult time getting up from the bed, less responsive and having congestion and wheezing. In ED, pt was afebrile and hemodynamically stable. Blood work revealed WBC count of 19.7, hemoglobin 9.8 and platelets of 1232. Sodium was 135, potassium 5.4 and creatinine 1.12. Based on abdominal x ray there was evidence of patchy right lower lobe airspace disease concerning for pneumonia.   Assessment and Plan:  Principal Problem:  HCAP (healthcare-associated pneumonia)  - pt was started on aztreonam and vancomycin. She is not responsive to verbal stimuli and not responding to painful stimuli - on morphine drip per palliative care  Active Problems:  Hyperkalemia  - mild elevation, 5.4, repeat level 5.5  - no further blood work to ensure comfort  Leukocytosis  - secondary to HCAP  - abx stopped as focus on comfort  Acute renal failure  - secondary to dehydration  Acute encephalopathy. Stupor - progressive chronic medical illness  Severe protein calorie malnutrition / Failure to thrive - secondary to chronic medical illness  - no PO intake due to lethargy   Code Status:  DNR/DNI     Time: 6:39 pm September 30, 2013  Signed:  Leisa Lenz  [facility] 09/30/13, 6:39 PM

## 2013-10-16 NOTE — Progress Notes (Signed)
Pt's niece Mardene Celeste chose [facility], I called them to try and get pt converted to GIP status but was informed that they are closed today and they cannot do it on weekends. Dr Charlies Silvers is aware.  Allene Dillon RN BSN  [phone]

## 2013-10-16 NOTE — Progress Notes (Signed)
Patient TF:[id number] Strahm      DOB: 1922-06-16      KYH:[id number]   Palliative Medicine Team at [facility] Progress Note    Subjective: Spoke with patient's niece/POA.  Jaliza continues to decline.  She appears to have increased work of breathing this am which should be treated with continuous opiates.  Patient POA understands and endorses aggressive symptom management. Discussed with Dr. Charlies Silvers. 12 mg morphine used over night.    Filed Vitals:   09/21/2013 0522  BP: 187/87  Pulse: 90  Temp: 97.4 F (36.3 C)  Resp: 20   Physical exam: General:  Unresponsive ,  Use of accessory muscles, increased secretions Pupils not examined, mm most Chest coarse wet rhonchi, no wheezing CVS: regular, distant Ext: cool and mottled Neuro: not responsive to tactile and verbal stimuli    Assessment and plan: 80 yr old white female with admission for altered mental status found to have likely pneumonia  Patient in obvious respiratory failure.  Family has elected comfort care.  Patient will require continuous opiates, used 12 mg overnight in boluses, still with increased work of breathing.  1.  DNR, comfort care  2.  Dyspnea with use of accessory muscles:  Start morphine 1 mg/hr with prn bolus and titration orders  3.  Terminal secretions:  Add atropine, already with scoplaomine   Discussed with Dr. Charlies Silvers and with POA  Total time 25 min 10- [place]. Lovena Le, MD MBA The Palliative Medicine Team at [facility] Phone: [phone] Pager: [phone]

## 2013-10-16 NOTE — Progress Notes (Signed)
Clinical Social Work  CSW spoke with PMT MD who reports poor prognosis. At this time it does not appear patient will return to SNF. CSW will continue to follow and assist as needed.  [place], [place] [phone]

## 2013-10-16 NOTE — Consult Note (Signed)
I have reviewed this case with our NP and agree with the Assessment and Plan as stated.  Keelan Tripodi L. Tamera Pingley, MD MBA The Palliative Medicine Team at [facility] Team Phone: [phone] Pager: [phone]   

## 2013-10-16 NOTE — Progress Notes (Signed)
10/01/2013 0845  Clinical Encounter Type  Visited With Patient not available (no family present)   Spiritual Care attempted visit per consult, but no family present.  Please page [phone] for chaplain care when family available for support.  Thank you.  [place], [place]

## 2013-10-16 NOTE — Progress Notes (Signed)
I think patient is regularly seen by Dr. Wenda Low at [facility] and therefore should be on our service.   She is unresponsive. She is tachypneic but does not appear to be uncomfortable from it. I note plans for comfort care only. No changes recommended right now.

## 2013-10-16 DEATH — deceased
